# Patient Record
Sex: Female | Born: 1955 | Race: White | Hispanic: No | Smoking: Current every day smoker
Health system: Southern US, Community
[De-identification: ages and names within clinical notes are randomized; demographics above are authoritative.]

## PROBLEM LIST (undated history)

## (undated) ENCOUNTER — Ambulatory Visit (HOSPITAL_COMMUNITY): Admission: EM | Payer: Self-pay

## (undated) DIAGNOSIS — K219 Gastro-esophageal reflux disease without esophagitis: Secondary | ICD-10-CM

## (undated) DIAGNOSIS — C439 Malignant melanoma of skin, unspecified: Secondary | ICD-10-CM

## (undated) DIAGNOSIS — F329 Major depressive disorder, single episode, unspecified: Secondary | ICD-10-CM

## (undated) DIAGNOSIS — J42 Unspecified chronic bronchitis: Secondary | ICD-10-CM

## (undated) DIAGNOSIS — F32A Depression, unspecified: Secondary | ICD-10-CM

## (undated) DIAGNOSIS — G4700 Insomnia, unspecified: Secondary | ICD-10-CM

## (undated) DIAGNOSIS — F1020 Alcohol dependence, uncomplicated: Secondary | ICD-10-CM

## (undated) DIAGNOSIS — R490 Dysphonia: Secondary | ICD-10-CM

## (undated) DIAGNOSIS — I10 Essential (primary) hypertension: Secondary | ICD-10-CM

## (undated) HISTORY — DX: Malignant melanoma of skin, unspecified: C43.9

## (undated) HISTORY — DX: Dysphonia: R49.0

## (undated) HISTORY — DX: Essential (primary) hypertension: I10

## (undated) HISTORY — DX: Unspecified chronic bronchitis: J42

## (undated) HISTORY — PX: ELBOW SURGERY: SHX618

---

## 1998-05-13 ENCOUNTER — Ambulatory Visit (HOSPITAL_COMMUNITY): Admission: RE | Admit: 1998-05-13 | Discharge: 1998-05-13 | Payer: Self-pay | Admitting: Internal Medicine

## 1998-05-17 ENCOUNTER — Ambulatory Visit (HOSPITAL_COMMUNITY): Admission: RE | Admit: 1998-05-17 | Discharge: 1998-05-17 | Payer: Self-pay | Admitting: Internal Medicine

## 1999-01-14 ENCOUNTER — Encounter: Payer: Self-pay | Admitting: Internal Medicine

## 1999-01-14 ENCOUNTER — Ambulatory Visit (HOSPITAL_COMMUNITY): Admission: RE | Admit: 1999-01-14 | Discharge: 1999-01-14 | Payer: Self-pay | Admitting: Internal Medicine

## 2000-08-24 HISTORY — PX: BREAST ENHANCEMENT SURGERY: SHX7

## 2003-01-30 ENCOUNTER — Encounter: Payer: Self-pay | Admitting: Internal Medicine

## 2003-01-30 ENCOUNTER — Encounter: Admission: RE | Admit: 2003-01-30 | Discharge: 2003-01-30 | Payer: Self-pay | Admitting: Internal Medicine

## 2005-04-01 ENCOUNTER — Other Ambulatory Visit: Admission: RE | Admit: 2005-04-01 | Discharge: 2005-04-01 | Payer: Self-pay | Admitting: Internal Medicine

## 2005-12-23 ENCOUNTER — Encounter: Payer: Self-pay | Admitting: Internal Medicine

## 2007-08-25 DIAGNOSIS — C439 Malignant melanoma of skin, unspecified: Secondary | ICD-10-CM

## 2007-08-25 HISTORY — DX: Malignant melanoma of skin, unspecified: C43.9

## 2008-02-28 ENCOUNTER — Ambulatory Visit (HOSPITAL_COMMUNITY): Admission: RE | Admit: 2008-02-28 | Discharge: 2008-02-28 | Payer: Self-pay | Admitting: Internal Medicine

## 2008-06-26 ENCOUNTER — Other Ambulatory Visit: Admission: RE | Admit: 2008-06-26 | Discharge: 2008-06-26 | Payer: Self-pay | Admitting: Obstetrics and Gynecology

## 2009-12-04 ENCOUNTER — Encounter: Admission: RE | Admit: 2009-12-04 | Discharge: 2009-12-04 | Payer: Self-pay | Admitting: Internal Medicine

## 2010-06-24 DIAGNOSIS — R490 Dysphonia: Secondary | ICD-10-CM

## 2010-06-24 HISTORY — DX: Dysphonia: R49.0

## 2010-08-24 HISTORY — PX: BASAL CELL CARCINOMA EXCISION: SHX1214

## 2013-04-18 ENCOUNTER — Other Ambulatory Visit (HOSPITAL_COMMUNITY): Payer: Self-pay | Admitting: Internal Medicine

## 2013-04-18 DIAGNOSIS — Z1231 Encounter for screening mammogram for malignant neoplasm of breast: Secondary | ICD-10-CM

## 2013-04-26 ENCOUNTER — Ambulatory Visit (HOSPITAL_COMMUNITY)
Admission: RE | Admit: 2013-04-26 | Discharge: 2013-04-26 | Disposition: A | Discharge status: Home / Self Care | Payer: BC Managed Care – PPO | Source: Ambulatory Visit | Attending: Internal Medicine | Admitting: Internal Medicine

## 2013-04-26 DIAGNOSIS — Z1231 Encounter for screening mammogram for malignant neoplasm of breast: Secondary | ICD-10-CM

## 2013-05-01 ENCOUNTER — Other Ambulatory Visit: Payer: Self-pay | Admitting: Internal Medicine

## 2013-05-01 DIAGNOSIS — R928 Other abnormal and inconclusive findings on diagnostic imaging of breast: Secondary | ICD-10-CM

## 2013-05-04 ENCOUNTER — Ambulatory Visit
Admission: RE | Admit: 2013-05-04 | Discharge: 2013-05-04 | Disposition: A | Discharge status: Home / Self Care | Payer: BC Managed Care – PPO | Source: Ambulatory Visit | Attending: Internal Medicine | Admitting: Internal Medicine

## 2013-05-04 DIAGNOSIS — R928 Other abnormal and inconclusive findings on diagnostic imaging of breast: Secondary | ICD-10-CM

## 2013-12-05 ENCOUNTER — Other Ambulatory Visit: Payer: Self-pay | Admitting: Internal Medicine

## 2013-12-05 ENCOUNTER — Ambulatory Visit
Admission: RE | Admit: 2013-12-05 | Discharge: 2013-12-05 | Disposition: A | Discharge status: Home / Self Care | Payer: BC Managed Care – PPO | Source: Ambulatory Visit | Attending: Internal Medicine | Admitting: Internal Medicine

## 2013-12-05 DIAGNOSIS — R109 Unspecified abdominal pain: Secondary | ICD-10-CM

## 2013-12-12 ENCOUNTER — Other Ambulatory Visit: Payer: BC Managed Care – PPO

## 2014-02-12 ENCOUNTER — Other Ambulatory Visit (HOSPITAL_COMMUNITY)
Admission: RE | Admit: 2014-02-12 | Discharge: 2014-02-12 | Disposition: A | Discharge status: Home / Self Care | Payer: BC Managed Care – PPO | Source: Ambulatory Visit | Attending: Internal Medicine | Admitting: Internal Medicine

## 2014-02-12 ENCOUNTER — Other Ambulatory Visit: Payer: Self-pay | Admitting: Internal Medicine

## 2014-02-12 DIAGNOSIS — Z1151 Encounter for screening for human papillomavirus (HPV): Secondary | ICD-10-CM | POA: Insufficient documentation

## 2014-02-12 DIAGNOSIS — Z01419 Encounter for gynecological examination (general) (routine) without abnormal findings: Secondary | ICD-10-CM | POA: Insufficient documentation

## 2014-02-15 ENCOUNTER — Other Ambulatory Visit: Payer: BC Managed Care – PPO

## 2014-02-16 LAB — CYTOLOGY - PAP

## 2014-02-20 ENCOUNTER — Encounter (INDEPENDENT_AMBULATORY_CARE_PROVIDER_SITE_OTHER): Payer: Self-pay

## 2014-02-20 ENCOUNTER — Ambulatory Visit
Admission: RE | Admit: 2014-02-20 | Discharge: 2014-02-20 | Disposition: A | Discharge status: Home / Self Care | Payer: BC Managed Care – PPO | Source: Ambulatory Visit | Attending: Internal Medicine | Admitting: Internal Medicine

## 2014-02-20 DIAGNOSIS — R109 Unspecified abdominal pain: Secondary | ICD-10-CM

## 2014-02-20 MED ORDER — IOHEXOL 300 MG/ML  SOLN
100.0000 mL | Freq: Once | INTRAMUSCULAR | Status: AC | PRN
  Administered 2014-02-20: 100 mL via INTRAVENOUS

## 2015-01-30 ENCOUNTER — Other Ambulatory Visit: Payer: Self-pay | Admitting: Internal Medicine

## 2015-01-30 DIAGNOSIS — H532 Diplopia: Secondary | ICD-10-CM

## 2015-02-05 ENCOUNTER — Ambulatory Visit
Admission: RE | Admit: 2015-02-05 | Discharge: 2015-02-05 | Disposition: A | Discharge status: Home / Self Care | Payer: BLUE CROSS/BLUE SHIELD | Source: Ambulatory Visit | Attending: Internal Medicine | Admitting: Internal Medicine

## 2015-02-05 ENCOUNTER — Other Ambulatory Visit: Payer: Self-pay

## 2015-02-05 DIAGNOSIS — H532 Diplopia: Secondary | ICD-10-CM

## 2015-04-21 IMAGING — CT CT ABD-PELV W/ CM
3 of 5 series · 13 of 36 positions shown, 19 images · IV contrast (READICAT/WATER & [ID] OMNI 300)
Comparison: 12/04/2009 ultrasound

CLINICAL DATA: Generalized abdominal pain for 4 months with
microscopic hematuria, history of melanoma

EXAM:
CT ABDOMEN AND PELVIS WITH CONTRAST
TECHNIQUE: Multidetector CT imaging of the abdomen and pelvis was performed
using the standard protocol following bolus administration of
intravenous contrast.
CONTRAST:  100mL OMNIPAQUE IOHEXOL 300 MG/ML  SOLN

[Series 3: abd/pelvis with · axial · 0.78mm/px · z∈[-352,-37]mm · 7 of 85 slices shown, 12 images]
[im 11/85  soft-tissue]
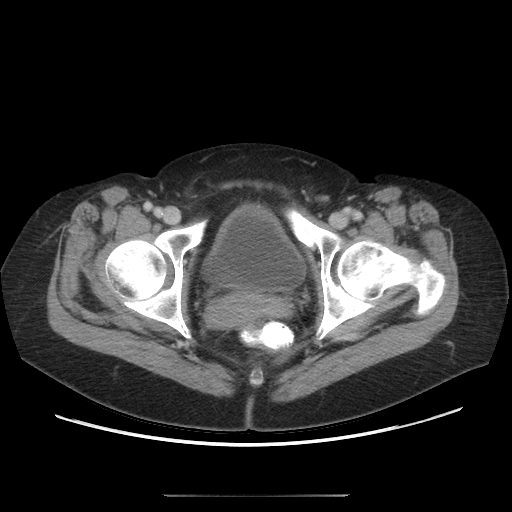
[im 11/85  bone]
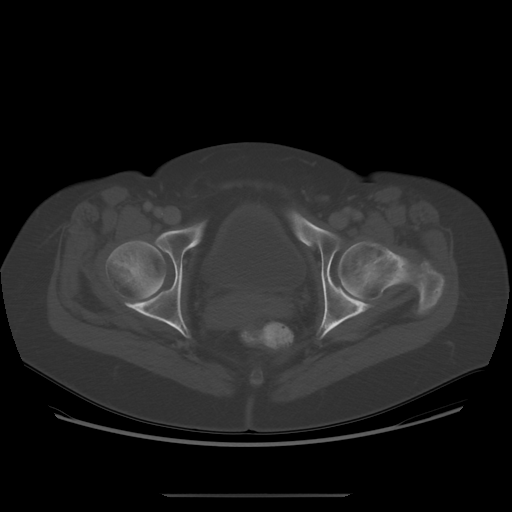
[im 22/85  soft-tissue]
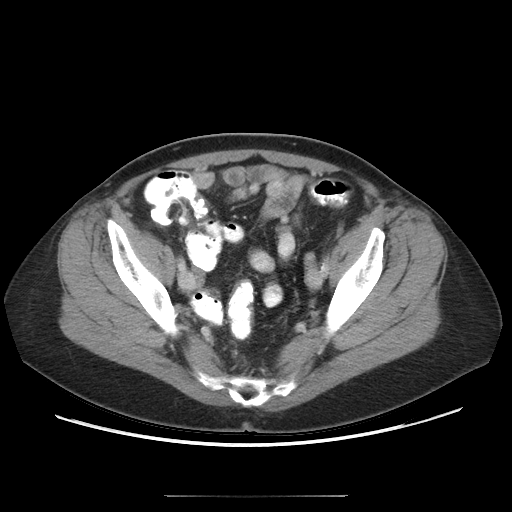
[im 32/85  soft-tissue]
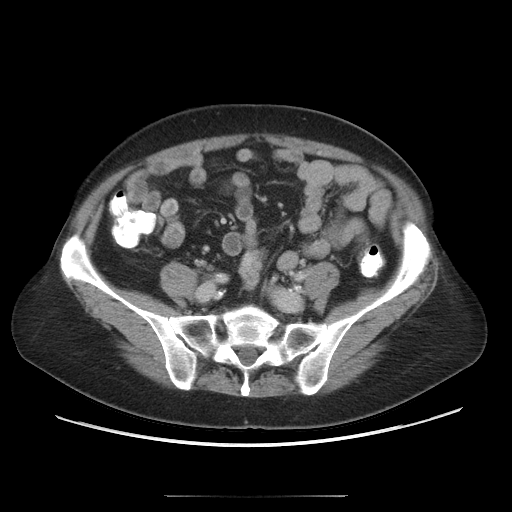
[im 43/85  soft-tissue]
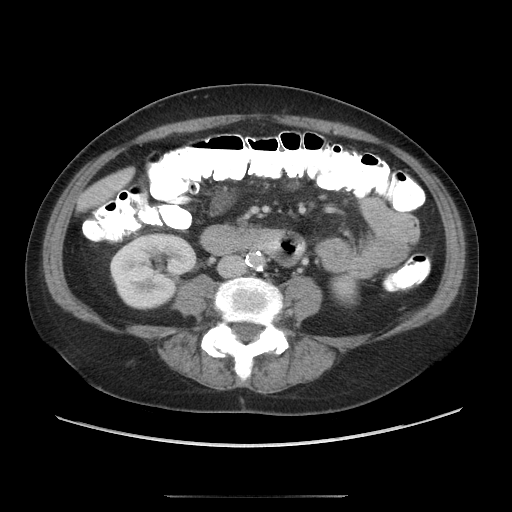
[im 43/85  lung]
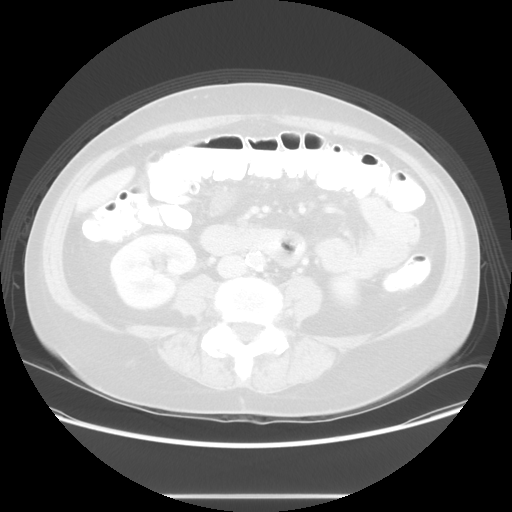
[im 53/85  soft-tissue]
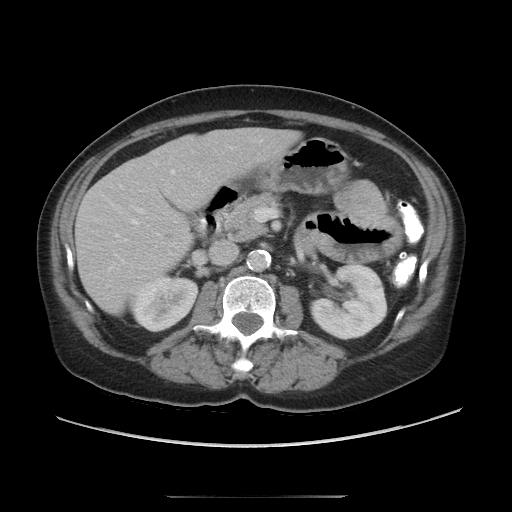
[im 53/85  lung]
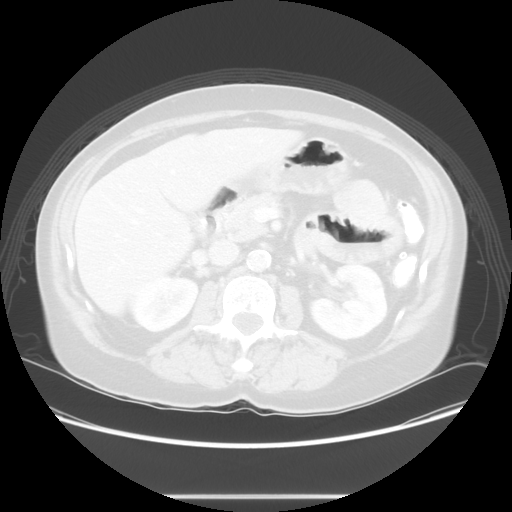
[im 64/85  soft-tissue]
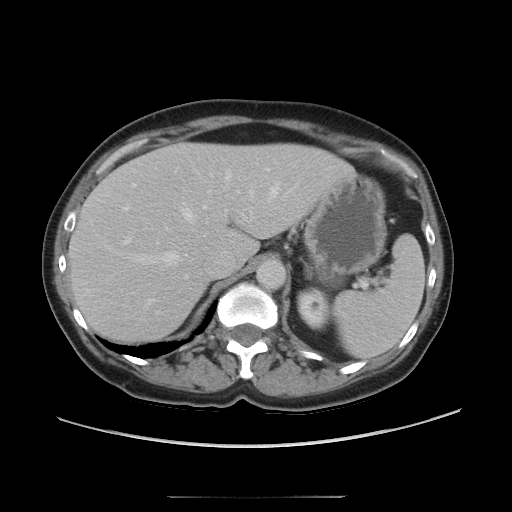
[im 64/85  lung]
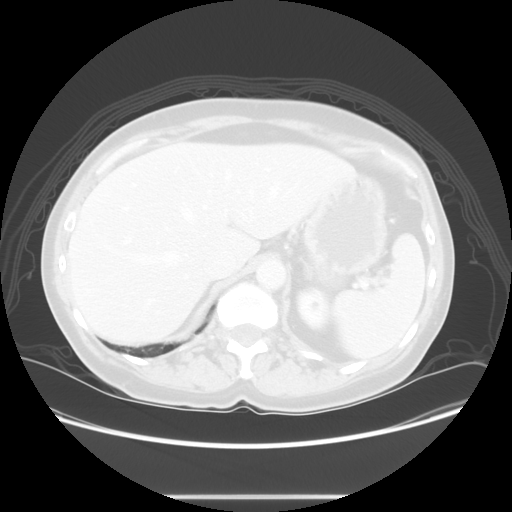
[im 74/85  soft-tissue]
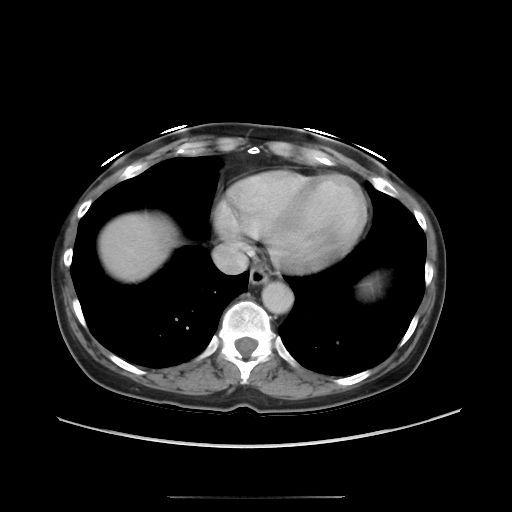
[im 74/85  lung]
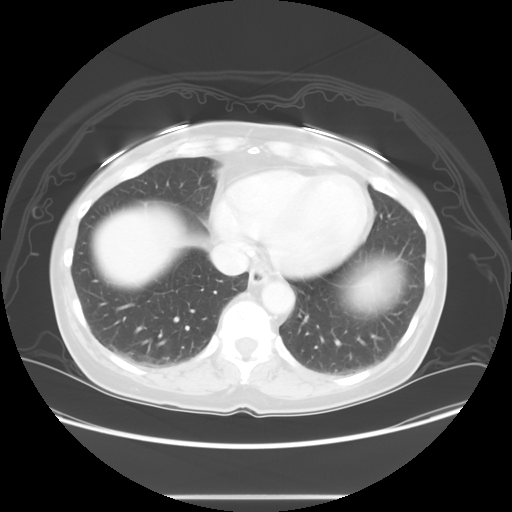

[Series 601: coronal body · coronal · 0.87mm/px · 1 of 112 slices shown, 2 images]
[im 38/112  soft-tissue]
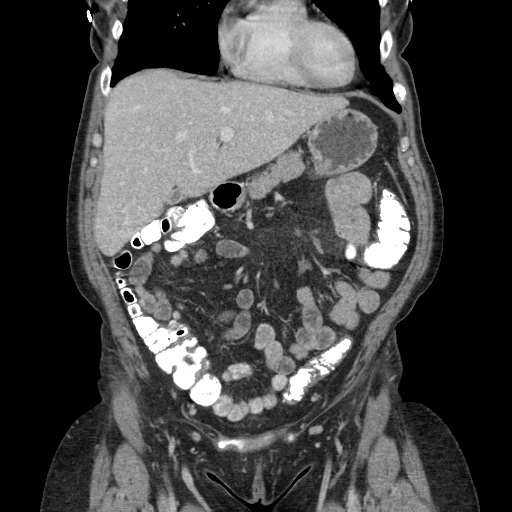
[im 38/112  bone]
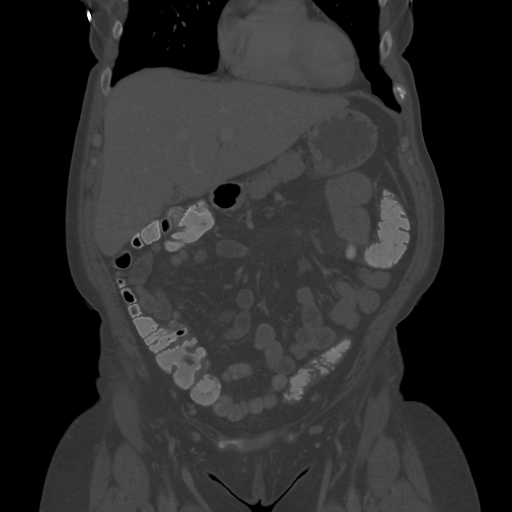

[Series 602: sagittal body · sagittal · 0.87mm/px · 5 of 161 slices shown]
[im 11/161  soft-tissue]
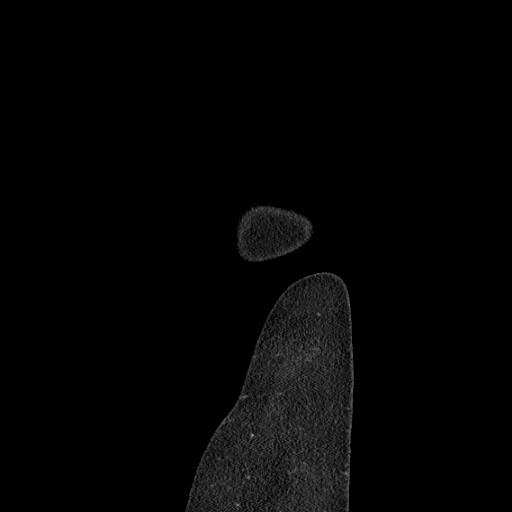
[im 31/161  soft-tissue]
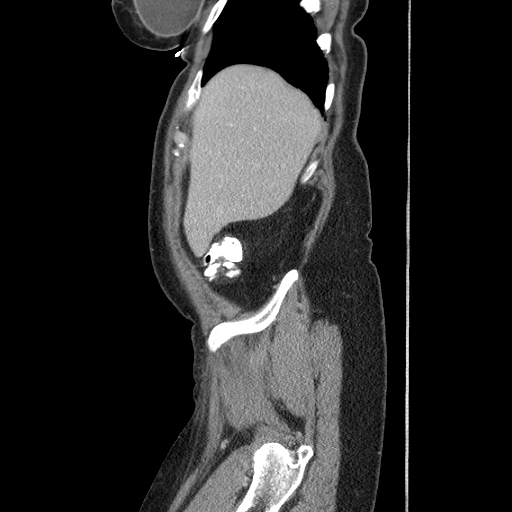
[im 51/161  soft-tissue]
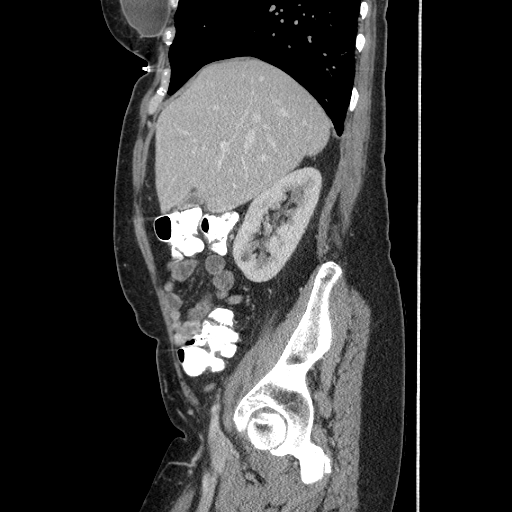
[im 71/161  soft-tissue]
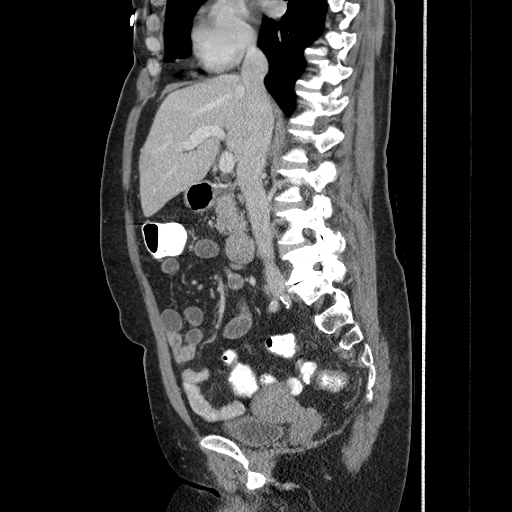
[im 91/161  soft-tissue]
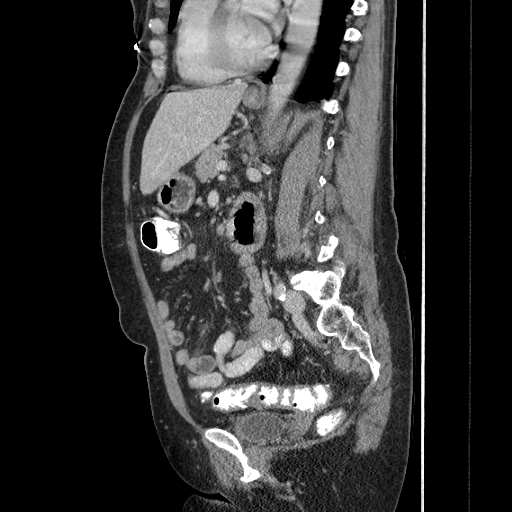

[13 of 36 positions shown; findings below may reference images not displayed]

FINDINGS: Visualized portions of the lung bases are clear.

Gallbladder is contracted.  Liver is normal.  Spleen is normal.

Pancreas is normal. Adrenal glands are normal. Small round
low-attenuation lesions in the left kidney all less than a cm, too
small to characterize. Similar low-attenuation lesion lower pole
right kidney.

Moderate calcification of the abdominal aorta. Nonobstructive bowel
gas pattern. Mild diverticulosis of the distal descending colon with
moderate diverticulosis of the sigmoid colon. No evidence of
diverticulitis.

Bladder is normal. Reproductive organs are normal. No significant
retroperitoneal or mesenteric adenopathy. No free fluid.

No acute musculoskeletal findings.
IMPRESSION: No acute findings. Incidentally detected subcentimeter
low-attenuation renal lesions likely cysts but not well
characterized by CT given their small size. One small cyst was seen
on renal ultrasound on the right performed 12/04/2009.

Diverticulosis.

## 2015-12-10 ENCOUNTER — Ambulatory Visit
Admission: RE | Admit: 2015-12-10 | Discharge: 2015-12-10 | Disposition: A | Discharge status: Home / Self Care | Payer: BLUE CROSS/BLUE SHIELD | Source: Ambulatory Visit | Attending: Internal Medicine | Admitting: Internal Medicine

## 2015-12-10 ENCOUNTER — Other Ambulatory Visit: Payer: Self-pay | Admitting: Internal Medicine

## 2015-12-10 DIAGNOSIS — F17209 Nicotine dependence, unspecified, with unspecified nicotine-induced disorders: Secondary | ICD-10-CM

## 2017-01-23 ENCOUNTER — Emergency Department (HOSPITAL_COMMUNITY)
Admission: EM | Admit: 2017-01-23 | Discharge: 2017-01-23 | Disposition: A | Discharge status: Home / Self Care | Payer: BLUE CROSS/BLUE SHIELD | Attending: Emergency Medicine | Admitting: Emergency Medicine

## 2017-01-23 ENCOUNTER — Encounter (HOSPITAL_COMMUNITY): Payer: Self-pay | Admitting: Emergency Medicine

## 2017-01-23 DIAGNOSIS — Z85828 Personal history of other malignant neoplasm of skin: Secondary | ICD-10-CM | POA: Insufficient documentation

## 2017-01-23 DIAGNOSIS — M79644 Pain in right finger(s): Secondary | ICD-10-CM

## 2017-01-23 DIAGNOSIS — X58XXXA Exposure to other specified factors, initial encounter: Secondary | ICD-10-CM | POA: Diagnosis not present

## 2017-01-23 DIAGNOSIS — S60456A Superficial foreign body of right little finger, initial encounter: Secondary | ICD-10-CM | POA: Diagnosis present

## 2017-01-23 DIAGNOSIS — Y929 Unspecified place or not applicable: Secondary | ICD-10-CM | POA: Diagnosis not present

## 2017-01-23 DIAGNOSIS — Z79899 Other long term (current) drug therapy: Secondary | ICD-10-CM | POA: Diagnosis not present

## 2017-01-23 DIAGNOSIS — Y999 Unspecified external cause status: Secondary | ICD-10-CM | POA: Diagnosis not present

## 2017-01-23 DIAGNOSIS — Y939 Activity, unspecified: Secondary | ICD-10-CM | POA: Insufficient documentation

## 2017-01-23 DIAGNOSIS — F17210 Nicotine dependence, cigarettes, uncomplicated: Secondary | ICD-10-CM | POA: Insufficient documentation

## 2017-01-23 DIAGNOSIS — I10 Essential (primary) hypertension: Secondary | ICD-10-CM | POA: Diagnosis not present

## 2017-01-23 NOTE — ED Notes (Signed)
MD at bedside. Cut ring off. Pt had immediate relief. No further questions.

## 2017-01-23 NOTE — ED Provider Notes (Signed)
[place] DEPT Provider Note   CSN: [id number] Arrival date & time: 01/23/17  0604     History   Chief Complaint Chief Complaint  Patient presents with  . ring stuck on finger    HPI Dorothy [name] is a 61 y.o. female.  HPI Patient reports the ring on her right little finger is stuck and she is unable to get this off.  When she went to bed and was normal.  She does not remember what she did in the middle night.  She feels like her fingers swelling.  Her pain is mild to moderate.  She's tried ice and lubrication without improvement.   Past Medical History:  Diagnosis Date  . Chronic bronchitis (HCC)    Still smoking  . Hoarseness 06/2010   Oral thrush---- Dr. Melida Quitter  . Hypertension   . Malignant melanoma ([place]) 2009   Removed from left shoulder    There are no active problems to display for this patient.   Past Surgical History:  Procedure Laterality Date  . BASAL CELL CARCINOMA EXCISION  2012   Right side nose   . BREAST ENHANCEMENT SURGERY  2002  . ELBOW SURGERY     Right elbow surgery---Ruptured tendon    OB History    No data available       Home Medications    Prior to Admission medications   Medication Sig Start Date End Date Taking? Authorizing Provider  amLODipine (NORVASC) 5 MG tablet Take 5 mg by mouth daily.    [provider]  fluticasone (FLONASE) 50 MCG/ACT nasal spray Place into both nostrils daily.    [provider]  losartan-hydrochlorothiazide (HYZAAR) 50-12.5 MG per tablet Take 1 tablet by mouth daily.    [provider]  triamcinolone cream (KENALOG) 0.5 % Apply 1 application topically 2 (two) times daily as needed.    [provider]    Family History Family History  Problem Relation Age of Onset  . Cirrhosis Father     Social History Social History  Substance Use Topics  . Smoking status: Current Every Day Smoker    Packs/day: 1.00    Types: Cigarettes  . Smokeless tobacco:  Not on file  . Alcohol use Yes     Comment: everyday     Allergies   Patient has no known allergies.   Review of Systems Review of Systems  All other systems reviewed and are negative.    Physical Exam Updated Vital Signs BP (!) 176/102 (BP Location: Right Arm)   Pulse 83   Temp 98.4 F (36.9 C) (Oral)   Resp 18   Ht 5\' 6"  (1.676 m)   Wt 65.8 kg (145 lb)   SpO2 100%   BMI 23.40 kg/m   Physical Exam  Constitutional: She is oriented to person, place, and time. She appears well-developed and well-nourished.  HENT:  Head: Normocephalic.  Eyes: EOM are normal.  Neck: Normal range of motion.  Pulmonary/Chest: Effort normal.  Abdominal: She exhibits no distension.  Musculoskeletal:  Ring on her right little finger is wedged with some associated swelling.  No erythema.  No breakdown of skin  Neurological: She is alert and oriented to person, place, and time.  Psychiatric: She has a normal mood and affect.  Nursing note and vitals reviewed.    ED Treatments / Results  Labs (all labs ordered are listed, but only abnormal results are displayed) Labs Reviewed - No data to display  EKG  EKG  Interpretation None       Radiology No results found.  Procedures .Foreign Body Removal Performed by: Jola Schmidt Authorized by: Jola Schmidt  Consent: Verbal consent obtained. Consent given by: patient Intake: right little finger. Complexity: simple 1 objects recovered. Objects recovered: ring Post-procedure assessment: foreign body removed Patient tolerance: Patient tolerated the procedure well with no immediate complications   (including critical care time)  Medications Ordered in ED Medications - No data to display   Initial Impression / Assessment and Plan / ED Course  I have reviewed the triage vital signs and the nursing notes.  Pertinent labs & imaging results that were available during my care of the patient were reviewed by me and considered in my  medical decision making (see chart for details).     Ring removed without difficulty.  Discharge over good condition  Final Clinical Impressions(s) / ED Diagnoses   Final diagnoses:  None    New Prescriptions New Prescriptions   No medications on file     Jola Schmidt, MD 01/23/17 [id number]

## 2017-01-23 NOTE — ED Triage Notes (Addendum)
Pt to ED after being woken up out of her sleep with her ring stuck on her finger.  One ring on her pinky finger. Pt states she has tried ice and lubricant and was not able to get them off. CMS intact. Pt has swelling to finger

## 2017-01-23 NOTE — ED Notes (Signed)
Pt verbalized understanding of d/c instructions and has no further questions. Pt is stable, A&Ox4, VSS.  

## 2017-06-17 ENCOUNTER — Ambulatory Visit (HOSPITAL_COMMUNITY)
Admission: EM | Admit: 2017-06-17 | Discharge: 2017-06-17 | Disposition: A | Discharge status: Home / Self Care | Payer: BLUE CROSS/BLUE SHIELD

## 2017-06-17 ENCOUNTER — Encounter (HOSPITAL_COMMUNITY): Payer: Self-pay | Admitting: Emergency Medicine

## 2017-06-17 DIAGNOSIS — R1084 Generalized abdominal pain: Secondary | ICD-10-CM | POA: Diagnosis not present

## 2017-06-17 NOTE — Discharge Instructions (Signed)
Report to the emergency department for further evaluation of your abdominal pain

## 2017-06-17 NOTE — ED Provider Notes (Signed)
Dorothy [name]    CSN: [id number] Arrival date & time: 06/17/17  1340     History   Chief Complaint No chief complaint on file.   HPI Dorothy [name] is a 61 y.o. female.   Subjective:   Dorothy [name] is a 61 y.o. female who presents for evaluation of abdominal pain. The pain is intermittent/episodic, described as sharp and is 10/10 in intensity during these episodes. Pain is located in the RUQ as well as the periumbilical region without any radiation. Acute in onset this morning. Patient was in her normal state of health upon going to bed last night. Symptoms have been unchanged since onset. Aggravating factors: none.  Alleviating factors: none. Associated symptoms: none. The patient denies anorexia, constipation, diarrhea, fever, flatus, hematochezia, melena, nausea, vomiting, dysuria, back pain or flank pain.   The following portions of the patient's history were reviewed and updated as appropriate: allergies, current medications, past family history, past medical history, past social history, past surgical history and problem list.         Past Medical History:  Diagnosis Date  . Chronic bronchitis (HCC)    Still smoking  . Hoarseness 06/2010   Oral thrush---- Dr. Melida Quitter  . Hypertension   . Malignant melanoma ([place]) 2009   Removed from left shoulder    There are no active problems to display for this patient.   Past Surgical History:  Procedure Laterality Date  . BASAL CELL CARCINOMA EXCISION  2012   Right side nose   . BREAST ENHANCEMENT SURGERY  2002  . ELBOW SURGERY     Right elbow surgery---Ruptured tendon    OB History    No data available       Home Medications    Prior to Admission medications   Medication Sig Start Date End Date Taking? Authorizing Provider  amLODipine (NORVASC) 5 MG tablet Take 5 mg by mouth daily.    [provider]  fluticasone (FLONASE) 50 MCG/ACT nasal spray Place into both nostrils daily.     [provider]  losartan-hydrochlorothiazide (HYZAAR) 50-12.5 MG per tablet Take 1 tablet by mouth daily.    [provider]  triamcinolone cream (KENALOG) 0.5 % Apply 1 application topically 2 (two) times daily as needed.    [provider]    Family History Family History  Problem Relation Age of Onset  . Cirrhosis Father     Social History Social History  Substance Use Topics  . Smoking status: Current Every Day Smoker    Packs/day: 1.00    Types: Cigarettes  . Smokeless tobacco: Not on file  . Alcohol use Yes     Comment: everyday     Allergies   Patient has no known allergies.   Review of Systems Review of Systems  Constitutional: Negative for fever.  Gastrointestinal: Positive for abdominal pain. Negative for abdominal distention, blood in stool, constipation, diarrhea, nausea and vomiting.  Genitourinary: Negative for dysuria and flank pain.  Musculoskeletal: Negative for back pain.  All other systems reviewed and are negative.    Physical Exam Triage Vital Signs ED Triage Vitals [06/17/17 1404]  Enc Vitals Group     BP (!) 142/94     Pulse Rate 92     Resp 18     Temp 98.5 F (36.9 C)     Temp Source Oral     SpO2 99 %     Weight      Height  Head Circumference      Peak Flow      Pain Score      Pain Loc      Pain Edu?      Excl. in [place]?    No data found.   Updated Vital Signs BP (!) 142/94 (BP Location: Right Arm) Comment: Provider Enrique Sack advised of BP  Pulse 92   Temp 98.5 F (36.9 C) (Oral)   Resp 18   SpO2 99%   Visual Acuity Right Eye Distance:   Left Eye Distance:   Bilateral Distance:    Right Eye Near:   Left Eye Near:    Bilateral Near:     Physical Exam  Constitutional: She is oriented to person, place, and time. She appears well-developed and well-nourished.  Cardiovascular: Normal rate and regular rhythm.   Pulmonary/Chest: Effort normal and breath sounds normal.    Abdominal: Soft. Bowel sounds are normal. She exhibits no distension. There is tenderness. There is no rebound and no guarding.  Musculoskeletal: Normal range of motion.  Neurological: She is alert and oriented to person, place, and time.  Skin: Skin is warm and dry.  Psychiatric: She has a normal mood and affect.     UC Treatments / Results  Labs (all labs ordered are listed, but only abnormal results are displayed) Labs Reviewed - No data to display  EKG  EKG Interpretation None       Radiology No results found.  Procedures Procedures (including critical care time)  Medications Ordered in UC Medications - No data to display   Initial Impression / Assessment and Plan / UC Course  I have reviewed the triage vital signs and the nursing notes.  Pertinent labs & imaging results that were available during my care of the patient were reviewed by me and considered in my medical decision making (see chart for details).     61 year old female with history of diverticulitis that presents with acute abdominal pain. No anorexia, constipation, diarrhea, fever, flatus, melena, bloody stools, nausea or vomiting. Patient is nontoxic appearing. Has generalized tenderness throughout the abdomen. Differentials are vast and include acute cholecystitis, biliary colic or diverticulitis. Patient is afebrile and nontoxic appearance. Patient is advised to report to the emergency department for ongoing evaluation with CT scan and/or ultrasound. She was initially reluctant to going but has now agreed to such.  Final Clinical Impressions(s) / UC Diagnoses   Final diagnoses:  Generalized abdominal pain    New Prescriptions New Prescriptions   No medications on file     Controlled Substance Prescriptions [place] Controlled Substance Registry consulted? Not Applicable   Enrique Sack, [place] 06/17/17 1419

## 2017-06-17 NOTE — ED Triage Notes (Signed)
Patient has already seen samantha, np

## 2018-06-06 ENCOUNTER — Other Ambulatory Visit: Payer: Self-pay

## 2018-06-06 ENCOUNTER — Encounter (HOSPITAL_COMMUNITY): Payer: Self-pay

## 2018-06-06 ENCOUNTER — Emergency Department (HOSPITAL_COMMUNITY)
Admission: EM | Admit: 2018-06-06 | Discharge: 2018-06-06 | Disposition: A | Discharge status: Home / Self Care | Payer: BLUE CROSS/BLUE SHIELD | Attending: Emergency Medicine | Admitting: Emergency Medicine

## 2018-06-06 DIAGNOSIS — F1010 Alcohol abuse, uncomplicated: Secondary | ICD-10-CM | POA: Insufficient documentation

## 2018-06-06 DIAGNOSIS — Z5321 Procedure and treatment not carried out due to patient leaving prior to being seen by health care provider: Secondary | ICD-10-CM | POA: Insufficient documentation

## 2018-06-06 HISTORY — DX: Gastro-esophageal reflux disease without esophagitis: K21.9

## 2018-06-06 HISTORY — DX: Major depressive disorder, single episode, unspecified: F32.9

## 2018-06-06 HISTORY — DX: Insomnia, unspecified: G47.00

## 2018-06-06 HISTORY — DX: Depression, unspecified: F32.A

## 2018-06-06 NOTE — ED Notes (Signed)
Pt walked out of room and towards double doors for exit and this RN saw her and asked what she needed and the pt stated "I am leaving." She then walked out of the ER. Pt had a steady gate upon leaving.

## 2018-06-06 NOTE — ED Triage Notes (Signed)
Per EMS- Patient was at [facility] and was intoxicated at the [facility]. Patient requested detox from alcohol. History from physician states the patient drink >1 pint of Vodka a day and wine.

## 2018-06-06 NOTE — ED Notes (Signed)
Bed: WLPT3 Expected date:  Expected time:  Means of arrival:  Comments: 

## 2018-06-07 ENCOUNTER — Encounter (HOSPITAL_COMMUNITY): Payer: Self-pay | Admitting: Emergency Medicine

## 2018-06-07 ENCOUNTER — Emergency Department (HOSPITAL_COMMUNITY)
Admission: EM | Admit: 2018-06-07 | Discharge: 2018-06-08 | Disposition: A | Discharge status: Other Acute Inpatient Hospital | Payer: Self-pay | Attending: Emergency Medicine | Admitting: Emergency Medicine

## 2018-06-07 DIAGNOSIS — I10 Essential (primary) hypertension: Secondary | ICD-10-CM | POA: Insufficient documentation

## 2018-06-07 DIAGNOSIS — F1910 Other psychoactive substance abuse, uncomplicated: Secondary | ICD-10-CM | POA: Insufficient documentation

## 2018-06-07 DIAGNOSIS — F1010 Alcohol abuse, uncomplicated: Secondary | ICD-10-CM

## 2018-06-07 DIAGNOSIS — F322 Major depressive disorder, single episode, severe without psychotic features: Secondary | ICD-10-CM | POA: Insufficient documentation

## 2018-06-07 DIAGNOSIS — F1020 Alcohol dependence, uncomplicated: Secondary | ICD-10-CM | POA: Insufficient documentation

## 2018-06-07 DIAGNOSIS — F17210 Nicotine dependence, cigarettes, uncomplicated: Secondary | ICD-10-CM | POA: Insufficient documentation

## 2018-06-07 HISTORY — DX: Alcohol dependence, uncomplicated: F10.20

## 2018-06-07 LAB — COMPREHENSIVE METABOLIC PANEL
ALK PHOS: 71 U/L (ref 38–126)
ALT: 63 U/L — AB (ref 0–44)
AST: 90 U/L — AB (ref 15–41)
Albumin: 4.2 g/dL (ref 3.5–5.0)
Anion gap: 15 (ref 5–15)
BUN: 18 mg/dL (ref 6–20)
CALCIUM: 9.4 mg/dL (ref 8.9–10.3)
CO2: 24 mmol/L (ref 22–32)
CREATININE: 0.68 mg/dL (ref 0.44–1.00)
Chloride: 94 mmol/L — ABNORMAL LOW (ref 98–111)
GFR calc Af Amer: >60 mL/min (ref >60)
Glucose, Bld: 84 mg/dL (ref 70–99)
Potassium: 4.2 mmol/L (ref 3.5–5.1)
Sodium: 133 mmol/L — ABNORMAL LOW (ref 135–145)
Total Bilirubin: 1.6 mg/dL — ABNORMAL HIGH (ref 0.3–1.2)
Total Protein: 6.9 g/dL (ref 6.5–8.1)

## 2018-06-07 LAB — CBC
HEMATOCRIT: 43.9 % (ref 36.0–46.0)
Hemoglobin: 14.9 g/dL (ref 12.0–15.0)
MCH: 34.1 pg — AB (ref 26.0–34.0)
MCHC: 33.9 g/dL (ref 30.0–36.0)
MCV: 100.5 fL — ABNORMAL HIGH (ref 80.0–100.0)
Platelets: 229 10*3/uL (ref 150–400)
RBC: 4.37 MIL/uL (ref 3.87–5.11)
RDW: 14.1 % (ref 11.5–15.5)
WBC: 12.4 10*3/uL — AB (ref 4.0–10.5)
nRBC: 0 % (ref 0.0–0.2)

## 2018-06-07 LAB — RAPID URINE DRUG SCREEN, HOSP PERFORMED
Amphetamines: NOT DETECTED
BARBITURATES: NOT DETECTED
Benzodiazepines: POSITIVE — AB
Cocaine: NOT DETECTED
Opiates: NOT DETECTED
Tetrahydrocannabinol: POSITIVE — AB

## 2018-06-07 LAB — ETHANOL: Alcohol, Ethyl (B): <10 mg/dL (ref <10)

## 2018-06-07 MED ORDER — FLUOXETINE HCL 20 MG PO CAPS: 40.0000 mg | ORAL_CAPSULE | ORAL | Status: DC

## 2018-06-07 MED ORDER — LORAZEPAM 2 MG/ML IJ SOLN: 0.0000 mg | Freq: Four times a day (QID) | INTRAMUSCULAR | Status: DC

## 2018-06-07 MED ORDER — ALUM & MAG HYDROXIDE-SIMETH 200-200-20 MG/5ML PO SUSP: 30.0000 mL | Freq: Four times a day (QID) | ORAL | Status: DC | PRN

## 2018-06-07 MED ORDER — LORAZEPAM 1 MG PO TABS: 0.0000 mg | ORAL_TABLET | Freq: Two times a day (BID) | ORAL | Status: DC

## 2018-06-07 MED ORDER — LORAZEPAM 1 MG PO TABS
0.0000 mg | ORAL_TABLET | Freq: Four times a day (QID) | ORAL | Status: DC
  Administered 2018-06-07: 1 mg via ORAL
  Administered 2018-06-07: 3 mg via ORAL
  Filled 2018-06-07: qty 2
  Filled 2018-06-07: qty 1

## 2018-06-07 MED ORDER — LORAZEPAM 2 MG/ML IJ SOLN: 0.0000 mg | Freq: Two times a day (BID) | INTRAMUSCULAR | Status: DC

## 2018-06-07 MED ORDER — NICOTINE 21 MG/24HR TD PT24
21.0000 mg | MEDICATED_PATCH | Freq: Every day | TRANSDERMAL | Status: DC
  Administered 2018-06-07: 21 mg via TRANSDERMAL
  Filled 2018-06-07: qty 1

## 2018-06-07 MED ORDER — VITAMIN B-1 100 MG PO TABS
100.0000 mg | ORAL_TABLET | Freq: Every day | ORAL | Status: DC
  Administered 2018-06-07: 100 mg via ORAL
  Filled 2018-06-07: qty 1

## 2018-06-07 MED ORDER — THIAMINE HCL 100 MG/ML IJ SOLN: 100.0000 mg | Freq: Every day | INTRAMUSCULAR | Status: DC

## 2018-06-07 MED ORDER — CLONAZEPAM 1 MG PO TABS
1.0000 mg | ORAL_TABLET | Freq: Every day | ORAL | Status: DC
  Administered 2018-06-07: 1 mg via ORAL
  Filled 2018-06-07: qty 1

## 2018-06-07 NOTE — ED Notes (Signed)
Bed: WA27 Expected date:  Expected time:  Means of arrival:  Comments: 

## 2018-06-07 NOTE — ED Notes (Signed)
Per pt, states her last drink was at 0400

## 2018-06-07 NOTE — BH Assessment (Signed)
[place] Assessment Progress Note     Per Priscille Loveless, NP, Inpatient Treatment is recommended. Patient is under review for a possible [facility] admission.

## 2018-06-07 NOTE — ED Provider Notes (Signed)
[place] DEPT Provider Note   CSN: [id number] Arrival date & time: 06/07/18  1308     History   Chief Complaint Chief Complaint  Patient presents with  . IVC  . Addiction Problem  . Depression    HPI Dorothy [name] is a 62 y.o. female who presents the emergency department under involuntary commitment for alcohol abuse.  The patient was here yesterday evening after presenting to her primary care physician's office severe alcohol intoxication.  Her PCP sent her to the emergency department by ambulance.  Patient had a long wait here in the emergency department, was not under involuntary commitment and eventually left.  This morning her husband took out involuntary commitment paperwork and she is now here in the emergency department.  Patient states that she drinks "heavily all day long."  She drinks wine and vodka.  She takes clonazepam 1 daily at night for sleep.  She states that she gets the shakes when she does not drink.  She denies a history of DTs, seizures with withdrawal.  She is only ever tried to stop drinking one time and it only lasted about 1 week.  She is a daily cigarette smoker denies other drug use, she denies SI/HI/AVH. HPI  Past Medical History:  Diagnosis Date  . Alcoholic ([place])   . Chronic bronchitis (HCC)    Still smoking  . Depression   . GERD (gastroesophageal reflux disease)   . Hoarseness 06/2010   Oral thrush---- Dr. Melida Quitter  . Hypertension   . Insomnia   . Malignant melanoma ([place]) 2009   Removed from left shoulder    There are no active problems to display for this patient.   Past Surgical History:  Procedure Laterality Date  . BASAL CELL CARCINOMA EXCISION  2012   Right side nose   . BREAST ENHANCEMENT SURGERY  2002  . ELBOW SURGERY     Right elbow surgery---Ruptured tendon     OB History   None      Home Medications    Prior to Admission medications   Medication Sig Start Date End Date  Taking? Authorizing Provider  clonazePAM (KLONOPIN) 1 MG tablet Take 1 mg by mouth daily. 05/31/18  Yes [provider]  FLUoxetine (PROZAC) 20 MG capsule Take 40 mg by mouth every morning. 05/18/18  Yes [provider]    Family History Family History  Problem Relation Age of Onset  . Cirrhosis Father     Social History Social History   Tobacco Use  . Smoking status: Current Every Day Smoker    Packs/day: 1.00    Types: Cigarettes  . Smokeless tobacco: Never Used  Substance Use Topics  . Alcohol use: Yes    Comment: >1 pint vodka daily and wine  . Drug use: Not Currently     Allergies   Patient has no known allergies.   Review of Systems Review of Systems  Ten systems reviewed and are negative for acute change, except as noted in the HPI.   Physical Exam Updated Vital Signs BP 137/76 (BP Location: Left Arm)   Pulse 65   Temp 98.2 F (36.8 C) (Oral)   Resp (!) 26   SpO2 96%   Physical Exam  Constitutional: She is oriented to person, place, and time. She appears well-developed and well-nourished. No distress.  Patient has a flushed complexion.  Tearful, visibly tremulous.  She appears anxious. Vital signs noted with tachycardia and hypertension.  HENT:  Head:  Normocephalic and atraumatic.  Eyes: Conjunctivae are normal. No scleral icterus.  Neck: Normal range of motion.  Cardiovascular: Normal rate, regular rhythm and normal heart sounds. Exam reveals no gallop and no friction rub.  No murmur heard. Pulmonary/Chest: Effort normal and breath sounds normal. No respiratory distress.  Abdominal: Soft. Bowel sounds are normal. She exhibits no distension and no mass. There is no tenderness. There is no guarding.  Neurological: She is alert and oriented to person, place, and time.  Skin: Skin is warm and dry. She is not diaphoretic.  Psychiatric: Her behavior is normal. Her mood appears anxious. She is not agitated and not actively hallucinating.    Nursing note and vitals reviewed.    ED Treatments / Results  Labs (all labs ordered are listed, but only abnormal results are displayed) Labs Reviewed  COMPREHENSIVE METABOLIC PANEL - Abnormal; Notable for the following components:      Result Value   Sodium 133 (*)    Chloride 94 (*)    AST 90 (*)    ALT 63 (*)    Total Bilirubin 1.6 (*)    All other components within normal limits  CBC - Abnormal; Notable for the following components:   WBC 12.4 (*)    MCV 100.5 (*)    MCH 34.1 (*)    All other components within normal limits  RAPID URINE DRUG SCREEN, HOSP PERFORMED - Abnormal; Notable for the following components:   Benzodiazepines POSITIVE (*)    Tetrahydrocannabinol POSITIVE (*)    All other components within normal limits  ETHANOL    EKG None  Radiology No results found.  Procedures Procedures (including critical care time)  Medications Ordered in ED Medications  LORazepam (ATIVAN) injection 0-4 mg ( Intravenous See Alternative 06/07/18 2232)    Or  LORazepam (ATIVAN) tablet 0-4 mg (1 mg Oral Given 06/07/18 2232)  LORazepam (ATIVAN) injection 0-4 mg (has no administration in time range)    Or  LORazepam (ATIVAN) tablet 0-4 mg (has no administration in time range)  thiamine (VITAMIN B-1) tablet 100 mg (100 mg Oral Given 06/07/18 1431)    Or  thiamine (B-1) injection 100 mg ( Intravenous See Alternative 06/07/18 1431)  nicotine (NICODERM CQ - dosed in mg/24 hours) patch 21 mg (21 mg Transdermal Patch Applied 06/07/18 1431)  alum & mag hydroxide-simeth (MAALOX/MYLANTA) 200-200-20 MG/5ML suspension 30 mL (has no administration in time range)  clonazePAM (KLONOPIN) tablet 1 mg (1 mg Oral Given 06/07/18 2231)  FLUoxetine (PROZAC) capsule 40 mg (has no administration in time range)     Initial Impression / Assessment and Plan / ED Course  I have reviewed the triage vital signs and the nursing notes.  Pertinent labs & imaging results that were available  during my care of the patient were reviewed by me and considered in my medical decision making (see chart for details).     Patient here under involuntary commitment.  Her current C waw score is calculated at 10 however the patient is visibly tremulous with tachycardia and market hypertension.  Believe these are all related to alcohol dependence and withdrawal.  Currently the patient will be treated medically to reduce her withdrawal symptoms.  She may need medical admission however we will try to control this and if possible have a psychiatry consult.   Patient vital signs normalized as well with oral benzodiazepines therefore I feel she is safe for psychiatric inpatient detox and does not need hospital admission.  I have performed the  first examination per IVC paperwork.  Patient has been recommended for inpatient treatment by psychiatry she is stable throughout her ER visit  Final Clinical Impressions(s) / ED Diagnoses   Final diagnoses:  Substance abuse [facility])  Alcohol abuse    ED Discharge Orders    None       Margarita Mail, PA-C 06/08/18 0022    Maudie Flakes, MD 06/08/18 1558

## 2018-06-07 NOTE — ED Triage Notes (Signed)
EMS-states husband took out papers due to patient drinking excessively-sates she was here recently but left-

## 2018-06-07 NOTE — BH Assessment (Signed)
Assessment Note  Dorothy [name] is an 62 y.o. female who presented to [name] on IVC petitioned by her husband for her alcoholism/excessive drinking and her suicidal statements.  Patient states that she lost her job a year ago and states that she has not been able to get another one and her drinking has increased over the past year and she states that she has been drinking a bottle of wine and vodka daily to self-medicate her depression.  Patient states that she has severe depression and anxiety that is being treated for her PCP.  She states that she is prescribed Klonopin and Prozac, but it is not really helping.  Family states that patient has made statements about not wanting to live anymore, going to be in a better place and she has been very confused and disoriented when she has been drinking and passing out and urinating in the floor.  Patient states that she does not remember making statements about wanting to die, but states that when she is drinking that she is not aware of what she is doing.  Patient states that she does not want to die, but admits that she has medical issues that are compromised because of her drinking and she knows that she is going to die if she does not stop drinking.  Patient denies other substance use, she denies HI/Psychosis.  Patient states that she went for her doctor's appointment yesterday and states that she was drunk.  She states that her doctor was concerned and felt like she needed help so he had another physician drive her to the hospital.  She states that once she arrived here that she demanded to leave which led to her husband taking out an IVC on her.  Patient states that losing her jog as the [employer] and not being able to get another job has been the main trigger for her depression and alcohol use.  Patient states that she has been married for thirty-four years and is concerned that her drinking has compromised her marriage to  the point that it is going to end.  Patient has a 54 year old son and a 39 year old grand-daughter that means a lot to her.  Patient states: "I hope I have not blown it."  Patient denies any history of abuse or self-mutilation.  Patient presents as alert and oriented.  Her remote memory is intact , but recent is impaired.  Patient's thoughts are organized and her speech is clear and coherent.  Patient's judgment, insight and impulse control are all impaired.  Patient has a depressed mood and is moderately anxious.  Her psycho-motor activity is normal.  Patient is clean and neat.  Her eye contact is good.  Patient does not appear to be responding to internal stimuli.  Patient denies any current withdrawal symptoms other than mild tremors.  She denies a history of DTs and Seizures.  Patient states that Patient is very cooperative and pleasant. Patient states that she has not been able to sleep latey and , but states that her appetite is good.  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  Past Medical History:  Past Medical History:  Diagnosis Date  . Alcoholic ([place])   . Chronic bronchitis (HCC)    Still smoking  . Depression   . GERD (gastroesophageal reflux disease)   . Hoarseness 06/2010   Oral thrush---- Dr. Melida Quitter  .  Hypertension   . Insomnia   . Malignant melanoma ([place]) 2009   Removed from left shoulder    Past Surgical History:  Procedure Laterality Date  . BASAL CELL CARCINOMA EXCISION  2012   Right side nose   . BREAST ENHANCEMENT SURGERY  2002  . ELBOW SURGERY     Right elbow surgery---Ruptured tendon    Family History:  Family History  Problem Relation Age of Onset  . Cirrhosis Father     Social History:  reports that she has been smoking cigarettes. She has been smoking about 1.00 pack per day. She has never used smokeless tobacco. She reports that she drinks alcohol. She reports that she  has current or past drug history.  Additional Social History:  Alcohol / Drug Use Pain Medications: see MAR Prescriptions: see MAR Over the Counter: see MAR History of alcohol / drug use?: Yes Longest period of sobriety (when/how long): sober for 1 week in the past year Negative Consequences of Use: Personal relationships, Work / School Withdrawal Symptoms: Tremors Substance #1 Name of Substance 1: alcohol 1 - Age of First Use: 18 1 - Amount (size/oz): bottle of wine and vodka 1 - Frequency: daily 1 - Duration: for past year 1 - Last Use / Amount: last use was yesterday  CIWA: CIWA-Ar BP: (!) 181/99 Pulse Rate: 86 Nausea and Vomiting: mild nausea with no vomiting Tactile Disturbances: very mild itching, pins and needles, burning or numbness Tremor: moderate, with patient's arms extended Auditory Disturbances: very mild harshness or ability to frighten Paroxysmal Sweats: no sweat visible Visual Disturbances: very mild sensitivity Anxiety: two Headache, Fullness in Head: none present Agitation: normal activity Orientation and Clouding of Sensorium: oriented and can do serial additions CIWA-Ar Total: 10 COWS:    Allergies: No Known Allergies  Home Medications:  (Not in a hospital admission)  OB/GYN Status:  No LMP recorded. Patient is postmenopausal.  General Assessment Data TTS Assessment: In system Is this a Tele or Face-to-Face Assessment?: Face-to-Face Is this an Initial Assessment or a Re-assessment for this encounter?: Initial Assessment Patient Accompanied by:: N/A Language Other than English: No Living Arrangements: Other (Comment)(in home with husband) What gender do you identify as?: Female Marital status: Married Pharmacist, community name: Tamala Julian) Pregnancy Status: No Living Arrangements: Spouse/significant other Can pt return to current living arrangement?: Yes Admission Status: Involuntary Petitioner: Other(husband) Is patient capable of signing voluntary  admission?: Yes Referral Source: Self/Family/Friend Insurance type: (self-pay)     Crisis Care Plan Living Arrangements: Spouse/significant other Legal Guardian: Other:(self) Name of Psychiatrist: none Name of Therapist: none  Education Status Is patient currently in school?: No Is the patient employed, unemployed or receiving disability?: Unemployed  Risk to self with the past 6 months Suicidal Ideation: Yes-Currently Present(family states that she has made suicidal statements) Has patient been a risk to self within the past 6 months prior to admission? : No Suicidal Intent: No Has patient had any suicidal intent within the past 6 months prior to admission? : No Is patient at risk for suicide?: No Suicidal Plan?: No Has patient had any suicidal plan within the past 6 months prior to admission? : No Access to Means: No What has been your use of drugs/alcohol within the last 12 months?: daily alcohol use Previous Attempts/Gestures: No How many times?: 0 Other Self Harm Risks: (isolation, marital conflict and drinking ETOH) Triggers for Past Attempts: None known Intentional Self Injurious Behavior: None Family Suicide History: No Recent stressful life event(s): Job Loss Persecutory  voices/beliefs?: No Depression: Yes Depression Symptoms: Despondent, Insomnia, Tearfulness, Isolating, Loss of interest in usual pleasures, Feeling worthless/self pity Substance abuse history and/or treatment for substance abuse?: No Suicide prevention information given to non-admitted patients: Not applicable  Risk to Others within the past 6 months Homicidal Ideation: No Does patient have any lifetime risk of violence toward others beyond the six months prior to admission? : No Thoughts of Harm to Others: No Current Homicidal Intent: No Current Homicidal Plan: No Access to Homicidal Means: No Identified Victim: none History of harm to others?: No Assessment of Violence: None Noted Violent  Behavior Description: none Does patient have access to weapons?: (unknown) Criminal Charges Pending?: No Does patient have a court date: No Is patient on probation?: No  Psychosis Hallucinations: None noted Delusions: None noted  Mental Status Report Appearance/Hygiene: Unremarkable Eye Contact: Good Motor Activity: Freedom of movement Speech: Logical/coherent Level of Consciousness: Alert Mood: Depressed, Anxious Affect: Depressed Anxiety Level: Severe Thought Processes: Coherent, Relevant Judgement: Impaired Orientation: Person, Place, Time, Situation Obsessive Compulsive Thoughts/Behaviors: Severe(obsession to drink)  Cognitive Functioning Concentration: Decreased Memory: Remote Intact, Recent Impaired Is patient IDD: No Insight: Poor Impulse Control: Poor Appetite: Good Have you had any weight changes? : No Change Sleep: Decreased Total Hours of Sleep: (not sleeping) Vegetative Symptoms: Decreased grooming  ADLScreening [facility] Assessment Services) Patient's cognitive ability adequate to safely complete daily activities?: Yes Patient able to express need for assistance with ADLs?: Yes Independently performs ADLs?: Yes (appropriate for developmental age)  Prior Inpatient Therapy Prior Inpatient Therapy: No  Prior Outpatient Therapy Prior Outpatient Therapy: No Does patient have an ACCT team?: No Does patient have Intensive In-House Services?  : No Does patient have [facility] services? : No Does patient have P4CC services?: No  ADL Screening (condition at time of admission) Patient's cognitive ability adequate to safely complete daily activities?: Yes Is the patient deaf or have difficulty hearing?: No Does the patient have difficulty seeing, even when wearing glasses/contacts?: No Does the patient have difficulty concentrating, remembering, or making decisions?: No Patient able to express need for assistance with ADLs?: Yes Does the patient have difficulty  dressing or bathing?: No Independently performs ADLs?: Yes (appropriate for developmental age) Does the patient have difficulty walking or climbing stairs?: No Weakness of Legs: None Weakness of Arms/Hands: None     Therapy Consults (therapy consults require a physician order) PT Evaluation Needed: No OT Evalulation Needed: No SLP Evaluation Needed: No Abuse/Neglect Assessment (Assessment to be complete while patient is alone) Abuse/Neglect Assessment Can Be Completed: Yes Physical Abuse: Denies Verbal Abuse: Denies Sexual Abuse: Denies Exploitation of patient/patient's resources: Denies Self-Neglect: Denies Values / Beliefs Cultural Requests During Hospitalization: None Spiritual Requests During Hospitalization: None Consults Spiritual Care Consult Needed: No Social Work Consult Needed: No Regulatory affairs officer (For Healthcare) Does Patient Have a Medical Advance Directive?: No Would patient like information on creating a medical advance directive?: No - Patient declined Nutrition Screen- [facility] Adult/WL/AP Has the patient recently lost weight without trying?: No Has the patient been eating poorly because of a decreased appetite?: No Malnutrition Screening Tool Score: 0   Disposition Per Priscille Loveless, NP, Patient is recommended for inpatient treatment and [facility] is reviewing for admission     Disposition Initial Assessment Completed for this Encounter: Yes Disposition of Patient: Admit Type of inpatient treatment program: Adult  On Site Evaluation by:   Reviewed with Physician:    Judeth Porch Taylia Berber 06/07/2018 6:36 PM

## 2018-06-07 NOTE — ED Notes (Signed)
Pt resting at present, no distress noted, calm & cooperative.  A&O x 3, monitoring for safety, sitter at bedside.

## 2018-06-08 ENCOUNTER — Other Ambulatory Visit: Payer: Self-pay

## 2018-06-08 ENCOUNTER — Inpatient Hospital Stay (HOSPITAL_COMMUNITY)
Admission: AD | Admit: 2018-06-08 | Discharge: 2018-06-13 | DRG: 885 | Disposition: A | Discharge status: Home / Self Care | Payer: Federal, State, Local not specified - Other | Source: Intra-hospital | Attending: Psychiatry | Admitting: Psychiatry

## 2018-06-08 ENCOUNTER — Encounter (HOSPITAL_COMMUNITY): Payer: Self-pay

## 2018-06-08 DIAGNOSIS — F419 Anxiety disorder, unspecified: Secondary | ICD-10-CM | POA: Diagnosis not present

## 2018-06-08 DIAGNOSIS — Z8379 Family history of other diseases of the digestive system: Secondary | ICD-10-CM | POA: Diagnosis not present

## 2018-06-08 DIAGNOSIS — G4700 Insomnia, unspecified: Secondary | ICD-10-CM | POA: Diagnosis not present

## 2018-06-08 DIAGNOSIS — F1290 Cannabis use, unspecified, uncomplicated: Secondary | ICD-10-CM | POA: Diagnosis present

## 2018-06-08 DIAGNOSIS — F332 Major depressive disorder, recurrent severe without psychotic features: Secondary | ICD-10-CM | POA: Diagnosis not present

## 2018-06-08 DIAGNOSIS — Z818 Family history of other mental and behavioral disorders: Secondary | ICD-10-CM | POA: Diagnosis not present

## 2018-06-08 DIAGNOSIS — Z79899 Other long term (current) drug therapy: Secondary | ICD-10-CM | POA: Diagnosis not present

## 2018-06-08 DIAGNOSIS — F1020 Alcohol dependence, uncomplicated: Secondary | ICD-10-CM | POA: Diagnosis not present

## 2018-06-08 DIAGNOSIS — F17210 Nicotine dependence, cigarettes, uncomplicated: Secondary | ICD-10-CM | POA: Diagnosis present

## 2018-06-08 DIAGNOSIS — R45851 Suicidal ideations: Secondary | ICD-10-CM | POA: Diagnosis present

## 2018-06-08 DIAGNOSIS — F10239 Alcohol dependence with withdrawal, unspecified: Secondary | ICD-10-CM | POA: Diagnosis present

## 2018-06-08 DIAGNOSIS — Z560 Unemployment, unspecified: Secondary | ICD-10-CM | POA: Diagnosis not present

## 2018-06-08 DIAGNOSIS — Z85820 Personal history of malignant melanoma of skin: Secondary | ICD-10-CM | POA: Diagnosis not present

## 2018-06-08 MED ORDER — LORAZEPAM 1 MG PO TABS
1.0000 mg | ORAL_TABLET | Freq: Three times a day (TID) | ORAL | Status: AC
  Administered 2018-06-09 (×2): 1 mg via ORAL
  Filled 2018-06-08: qty 1
  Filled 2018-06-08: qty 2

## 2018-06-08 MED ORDER — ADULT MULTIVITAMIN W/MINERALS CH
1.0000 | ORAL_TABLET | Freq: Every day | ORAL | Status: DC
  Administered 2018-06-08 – 2018-06-13 (×6): 1 via ORAL
  Filled 2018-06-08 (×9): qty 1

## 2018-06-08 MED ORDER — LORAZEPAM 1 MG PO TABS
1.0000 mg | ORAL_TABLET | Freq: Two times a day (BID) | ORAL | Status: AC
  Administered 2018-06-10 (×2): 1 mg via ORAL
  Filled 2018-06-08 (×2): qty 1

## 2018-06-08 MED ORDER — VITAMIN B-1 100 MG PO TABS
100.0000 mg | ORAL_TABLET | Freq: Every day | ORAL | Status: DC
  Administered 2018-06-09 – 2018-06-13 (×5): 100 mg via ORAL
  Filled 2018-06-08 (×7): qty 1

## 2018-06-08 MED ORDER — LOPERAMIDE HCL 2 MG PO CAPS: 2.0000 mg | ORAL_CAPSULE | ORAL | Status: AC | PRN

## 2018-06-08 MED ORDER — HYDROXYZINE HCL 25 MG PO TABS: 25.0000 mg | ORAL_TABLET | Freq: Four times a day (QID) | ORAL | Status: DC | PRN

## 2018-06-08 MED ORDER — MAGNESIUM HYDROXIDE 400 MG/5ML PO SUSP: 30.0000 mL | Freq: Every day | ORAL | Status: DC | PRN

## 2018-06-08 MED ORDER — TRAZODONE HCL 50 MG PO TABS
50.0000 mg | ORAL_TABLET | Freq: Every evening | ORAL | Status: DC | PRN
  Administered 2018-06-08 – 2018-06-11 (×4): 50 mg via ORAL
  Filled 2018-06-08 (×2): qty 1

## 2018-06-08 MED ORDER — PNEUMOCOCCAL VAC POLYVALENT 25 MCG/0.5ML IJ INJ: 0.5000 mL | INJECTION | INTRAMUSCULAR | Status: DC

## 2018-06-08 MED ORDER — LORAZEPAM 1 MG PO TABS
1.0000 mg | ORAL_TABLET | Freq: Every day | ORAL | Status: AC
  Administered 2018-06-11: 1 mg via ORAL
  Filled 2018-06-08: qty 1

## 2018-06-08 MED ORDER — ACETAMINOPHEN 325 MG PO TABS: 650.0000 mg | ORAL_TABLET | Freq: Four times a day (QID) | ORAL | Status: DC | PRN

## 2018-06-08 MED ORDER — LORAZEPAM 1 MG PO TABS: 1.0000 mg | ORAL_TABLET | Freq: Four times a day (QID) | ORAL | Status: AC | PRN

## 2018-06-08 MED ORDER — TRAZODONE HCL 50 MG PO TABS: 50.0000 mg | ORAL_TABLET | Freq: Every evening | ORAL | Status: DC | PRN

## 2018-06-08 MED ORDER — FLUOXETINE HCL 20 MG PO CAPS
40.0000 mg | ORAL_CAPSULE | Freq: Every day | ORAL | Status: DC
  Administered 2018-06-08 – 2018-06-13 (×6): 40 mg via ORAL
  Filled 2018-06-08 (×3): qty 2
  Filled 2018-06-08: qty 14
  Filled 2018-06-08 (×5): qty 2

## 2018-06-08 MED ORDER — NICOTINE 21 MG/24HR TD PT24
21.0000 mg | MEDICATED_PATCH | Freq: Every day | TRANSDERMAL | Status: DC
  Administered 2018-06-08 – 2018-06-13 (×6): 21 mg via TRANSDERMAL
  Filled 2018-06-08 (×9): qty 1

## 2018-06-08 MED ORDER — ALUM & MAG HYDROXIDE-SIMETH 200-200-20 MG/5ML PO SUSP: 30.0000 mL | ORAL | Status: DC | PRN

## 2018-06-08 MED ORDER — LORAZEPAM 1 MG PO TABS
1.0000 mg | ORAL_TABLET | Freq: Four times a day (QID) | ORAL | Status: AC
  Administered 2018-06-08 (×3): 1 mg via ORAL
  Filled 2018-06-08 (×3): qty 1

## 2018-06-08 MED ORDER — ARIPIPRAZOLE 5 MG PO TABS
5.0000 mg | ORAL_TABLET | Freq: Every day | ORAL | Status: DC
  Administered 2018-06-08 – 2018-06-13 (×6): 5 mg via ORAL
  Filled 2018-06-08: qty 1
  Filled 2018-06-08: qty 7
  Filled 2018-06-08 (×7): qty 1

## 2018-06-08 MED ORDER — HYDROXYZINE HCL 50 MG PO TABS
50.0000 mg | ORAL_TABLET | Freq: Four times a day (QID) | ORAL | Status: DC | PRN
  Administered 2018-06-09 – 2018-06-12 (×4): 50 mg via ORAL
  Filled 2018-06-08: qty 1
  Filled 2018-06-08: qty 10
  Filled 2018-06-08 (×2): qty 1

## 2018-06-08 MED ORDER — ONDANSETRON 4 MG PO TBDP: 4.0000 mg | ORAL_TABLET | Freq: Four times a day (QID) | ORAL | Status: AC | PRN

## 2018-06-08 NOTE — Progress Notes (Signed)
Patient presents with anxious affect and behavior during admission interview and assessment. VS monitored and recorded. Skin check performed with Kedra MHT and revealed healing abrasions to left elbow and right knee. Contraband was not found. Patient was oriented to unit and schedule. Pt states "I am here to get some rest and to get off of cigarettes and alcohol". Pt denies SI/HI/AVH at this time. PO fluids provided. Safety maintained. Rest encouraged.

## 2018-06-08 NOTE — Tx Team (Signed)
Interdisciplinary Treatment and Diagnostic Plan Update  06/08/2018 Time of Session: 0830AM Dorothy [name] MRN: [id number]  Principal Diagnosis: MDD  Secondary Diagnoses: Active Problems:   Severe recurrent major depression without psychotic features (HCC)   Current Medications:  Current Facility-Administered Medications  Medication Dose Route Frequency Provider Last Rate Last Dose  . acetaminophen (TYLENOL) tablet 650 mg  650 mg Oral Q6H PRN Rozetta Nunnery, NP      . alum & mag hydroxide-simeth (MAALOX/MYLANTA) 200-200-20 MG/5ML suspension 30 mL  30 mL Oral Q4H PRN Lindon Romp A, NP      . hydrOXYzine (ATARAX/VISTARIL) tablet 25 mg  25 mg Oral Q6H PRN Lindon Romp A, NP      . loperamide (IMODIUM) capsule 2-4 mg  2-4 mg Oral PRN Lindon Romp A, NP      . LORazepam (ATIVAN) tablet 1 mg  1 mg Oral Q6H PRN Lindon Romp A, NP      . LORazepam (ATIVAN) tablet 1 mg  1 mg Oral QID Lindon Romp A, NP   1 mg at 06/08/18 0752   Followed by  . [START ON 06/09/2018] LORazepam (ATIVAN) tablet 1 mg  1 mg Oral TID Rozetta Nunnery, NP       Followed by  . [START ON 06/10/2018] LORazepam (ATIVAN) tablet 1 mg  1 mg Oral BID Rozetta Nunnery, NP       Followed by  . [START ON 06/11/2018] LORazepam (ATIVAN) tablet 1 mg  1 mg Oral Daily Lindon Romp A, NP      . magnesium hydroxide (MILK OF MAGNESIA) suspension 30 mL  30 mL Oral Daily PRN Lindon Romp A, NP      . multivitamin with minerals tablet 1 tablet  1 tablet Oral Daily Lindon Romp A, NP   1 tablet at 06/08/18 0752  . ondansetron (ZOFRAN-ODT) disintegrating tablet 4 mg  4 mg Oral Q6H PRN Rozetta Nunnery, NP      . [START ON 06/09/2018] pneumococcal 23 valent vaccine (PNU-IMMUNE) injection 0.5 mL  0.5 mL Intramuscular Tomorrow-1000 Lindon Romp A, NP      . Derrill Memo ON 06/09/2018] thiamine (VITAMIN B-1) tablet 100 mg  100 mg Oral Daily Lindon Romp A, NP      . traZODone (DESYREL) tablet 50 mg  50 mg Oral QHS PRN Rozetta Nunnery, NP       PTA  Medications: Medications Prior to Admission  Medication Sig Dispense Refill Last Dose  . clonazePAM (KLONOPIN) 1 MG tablet Take 1 mg by mouth daily.  0 06/07/2018 at Unknown time  . FLUoxetine (PROZAC) 20 MG capsule Take 40 mg by mouth every morning.  3 06/07/2018 at Unknown time    Patient Stressors:    Patient Strengths:    Treatment Modalities: Medication Management, Group therapy, Case management,  1 to 1 session with clinician, Psychoeducation, Recreational therapy.   Physician Treatment Plan for Primary Diagnosis: MDD  Medication Management: Evaluate patient's response, side effects, and tolerance of medication regimen.  Therapeutic Interventions: 1 to 1 sessions, Unit Group sessions and Medication administration.  Evaluation of Outcomes: Not Met  Physician Treatment Plan for Secondary Diagnosis: Active Problems:   Severe recurrent major depression without psychotic features ([name])   Medication Management: Evaluate patient's response, side effects, and tolerance of medication regimen.  Therapeutic Interventions: 1 to 1 sessions, Unit Group sessions and Medication administration.  Evaluation of Outcomes: Not Met   RN Treatment Plan for Primary Diagnosis: MDD Long Term Goal(s): Knowledge  of disease and therapeutic regimen to maintain health will improve  Short Term Goals: Ability to remain free from injury will improve, Ability to demonstrate self-control, Ability to participate in decision making will improve and Ability to disclose and discuss suicidal ideas  Medication Management: RN will administer medications as ordered by provider, will assess and evaluate patient's response and provide education to patient for prescribed medication. RN will report any adverse and/or side effects to prescribing provider.  Therapeutic Interventions: 1 on 1 counseling sessions, Psychoeducation, Medication administration, Evaluate responses to treatment, Monitor vital signs and CBGs as  ordered, Perform/monitor CIWA, COWS, AIMS and Fall Risk screenings as ordered, Perform wound care treatments as ordered.  Evaluation of Outcomes: Not Met   LCSW Treatment Plan for Primary Diagnosis: MDD Long Term Goal(s): Safe transition to appropriate next level of care at discharge, Engage patient in therapeutic group addressing interpersonal concerns.  Short Term Goals: Engage patient in aftercare planning with referrals and resources, Increase emotional regulation, Facilitate patient progression through stages of change regarding substance use diagnoses and concerns and Identify triggers associated with mental health/substance abuse issues  Therapeutic Interventions: Assess for all discharge needs, 1 to 1 time with Social worker, Explore available resources and support systems, Assess for adequacy in community support network, Educate family and significant other(s) on suicide prevention, Complete Psychosocial Assessment, Interpersonal group therapy.  Evaluation of Outcomes: Not Met   Progress in Treatment: Attending groups: No. New to unit. Continuing to assess.  Participating in groups: No. Taking medication as prescribed: Yes. Toleration medication: Yes. Family/Significant other contact made: No, will contact:  family member if pt consents to collateral contact Patient understands diagnosis: Yes. Discussing patient identified problems/goals with staff: Yes. Medical problems stabilized or resolved: Yes. Denies suicidal/homicidal ideation: Yes. Issues/concerns per patient self-inventory: No. Other: n/a  New problem(s) identified: No, Describe:  n/a  New Short Term/Long Term Goal(s): detox, medication management for mood stabilization; elimination of SI thoughts; development of comprehensive mental wellness/sobriety plan.   Patient Goals:  "to learn ways to stop drinking and smoking cigarettes."   Discharge Plan or Barriers: CSW assessing for appropriate referrals. Pt reports  that her PCP prescribes her mental health medication. [place] pamphlet, Mobile Crisis information, and AA/NA information provided to patient for additional community support and resources.    Reason for Continuation of Hospitalization: Anxiety Depression Medication stabilization Suicidal ideation Withdrawal symptoms  Estimated Length of Stay: Monday, 06/13/18  Attendees: Patient: 06/08/2018 8:49 AM  Physician: Dr. Parke Poisson MD; Dr. Nancy Fetter MD 06/08/2018 8:49 AM  Nursing: Yetta Flock RN; [facility] RN 06/08/2018 8:49 AM  RN Care Manager:x 06/08/2018 8:49 AM  Social Worker: Janice Norrie LCSW 06/08/2018 8:49 AM  Recreational Therapist: x 06/08/2018 8:49 AM  Other: Lindell Spar NP; Darnelle Maffucci Money NP 06/08/2018 8:49 AM  Other:  06/08/2018 8:49 AM  Other: 06/08/2018 8:49 AM    Scribe for Treatment Team: Avelina Laine, LCSW 06/08/2018 8:49 AM

## 2018-06-08 NOTE — Plan of Care (Signed)
  Problem: Coping: Goal: Ability to demonstrate self-control will improve Outcome: Progressing   Problem: Education: Goal: Emotional status will improve Outcome: Not Progressing Goal: Mental status will improve Outcome: Not Progressing   Problem: Activity: Goal: Sleeping patterns will improve Outcome: Not Progressing
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
## 2018-06-08 NOTE — Progress Notes (Signed)
Recreation Therapy Notes  Date: 10.16.19 Time: 0930 Location: [address] Dayroom  Group Topic: Stress Management  Goal Area(s) Addresses:  Patient will verbalize importance of using healthy stress management.  Patient will identify positive emotions associated with healthy stress management.   Intervention: Stress Management  Activity :  Meditation.  LRT introduced the stress management technique of meditation.  LRT played a meditation that dealt with resilience.  Patients were to listen and follow along as meditation played.  Education:  Stress Management, Discharge Planning.   Education Outcome: Acknowledges edcuation/In group clarification offered/Needs additional education  Clinical Observations/Feedback: Pt did not attend group.    Victorino Sparrow, LRT/CTRS         Ria Comment, Ernest Orr A 06/08/2018 11:26 AM

## 2018-06-08 NOTE — H&P (Signed)
Psychiatric Admission Assessment Adult  Patient Identification: Dorothy [name] MRN:  [id number] Date of Evaluation:  06/08/2018 Chief Complaint:  MDD recurrent severe,Etoh use disorder severe Principal Diagnosis: Severe recurrent major depression without psychotic features ([place]) Diagnosis:   Patient Active Problem List   Diagnosis Date Noted  . Severe recurrent major depression without psychotic features ([place]) [F33.2] 06/08/2018  . Alcohol use disorder, severe, dependence ([place]) [F10.20] 06/08/2018   History of Present Illness:   Dorothy [name] is a 62 y/o F with history of treatment for depression and alcohol use who was admitted from [facility] on IVC initiated by her husband due to worsening symptoms of depression, SI without plan, and worsening use of alcohol. Pt had been evaluated in [facility] one day prior to this admission after she was sent there via EMS from her PCP appointment where she arrived intoxicated, but she left AMA from the waiting room. Pt was medically cleared, started on alcohol withdrawal protocol with ativan, and she was transferred to [facility] for additional treatment and evaluation.  Upon initial interview, pt shares, "I was starting to drink all day. It was getting worse and worse." Pt reports that she has struggled with alcohol use for several years but in the past 1 year her drinking has significantly worsened, mostly associated with worsening depression and stressor of losing her job about 1 year ago and being unable to find a new job. Pt reports she is consuming about 1 bottle of wine and 1 pint of vodka daily, and she drinks from the moment she wakes up until she goes to sleep. She endorses depression symptoms of depressed mood, initial insomnia, anhedonia, guilty feelings, and psychomotor retardation. She denies SI, and states, "If I said anything like that, I was drunk." She denies HI/AH/VH. She reports minimal to no sleep for the 3 nights prior to admission, but she denies  previous episodes with decreased need for sleep and she denies all other symptoms of bipolar mania. She denies symptoms of OCD and PTSD. She smokes 2ppd and uses cannabis about 1-2x/month. She denies other illicit substance.  Discussed with patient about treatment options. She started trial of prozac 43m po qDay about 2 weeks ago, and she has not noticed any benefit. She also takes klonopin 130mpo qhs to help with insomnia. Discussed with patient about risks of combining alcohol and benzodiazepines, and pt was in agreement to stop klonopin and attempt trial of trazodone for treatment of insomnia. Pt would like to stay on prozac to give time for an adequate trial, and we discussed adding a trial of abilify to help augment prozac. Pt is open to referral to substance use treatment for alcohol, and she will discuss about available options with the SW team. She was in agreement with the above plan, and she had no further questions, comments, or concerns.   Associated Signs/Symptoms: Depression Symptoms:  depressed mood, anhedonia, insomnia, psychomotor retardation, feelings of worthlessness/guilt, hopelessness, suicidal thoughts without plan, anxiety, (Hypo) Manic Symptoms:  NA Anxiety Symptoms:  Excessive Worry, Psychotic Symptoms:  NA PTSD Symptoms: NA Total Time spent with patient: 1 hour  Past Psychiatric History:   -treatment for depression by PCP - no hx of inpatient stays - no previous formal diagnoses - no history of suicide attempts   Is the patient at risk to self? Yes.    Has the patient been a risk to self in the past 6 months? Yes.    Has the patient been a risk to self within the  distant past? Yes.    Is the patient a risk to others? Yes.    Has the patient been a risk to others in the past 6 months? Yes.    Has the patient been a risk to others within the distant past? Yes.     Prior Inpatient Therapy:   Prior Outpatient Therapy:    Alcohol Screening: 1. How often do  you have a drink containing alcohol?: 4 or more times a week 2. How many drinks containing alcohol do you have on a typical day when you are drinking?: 7, 8, or 9 3. How often do you have six or more drinks on one occasion?: Daily or almost daily AUDIT-C Score: 11 4. How often during the last year have you found that you were not able to stop drinking once you had started?: Monthly 5. How often during the last year have you failed to do what was normally expected from you becasue of drinking?: Monthly 6. How often during the last year have you needed a first drink in the morning to get yourself going after a heavy drinking session?: Monthly 7. How often during the last year have you had a feeling of guilt of remorse after drinking?: Monthly 8. How often during the last year have you been unable to remember what happened the night before because you had been drinking?: Monthly 9. Have you or someone else been injured as a result of your drinking?: No 10. Has a relative or friend or a doctor or another health worker been concerned about your drinking or suggested you cut down?: Yes, but not in the last year Alcohol Use Disorder Identification Test Final Score (AUDIT): 23 Intervention/Follow-up: Alcohol Education Substance Abuse History in the last 12 months:  Yes.   Consequences of Substance Abuse: Medical Consequences:  worsened mood symptoms Previous Psychotropic Medications: Yes  Psychological Evaluations: No  Past Medical History:  Past Medical History:  Diagnosis Date  . Alcoholic ([place])   . Chronic bronchitis ([facility])    Still smoking  . Depression   . GERD (gastroesophageal reflux disease)   . Hoarseness 06/2010   Oral thrush---- Dr. Melida Quitter  . Hypertension   . Insomnia   . Malignant melanoma ([place]) 2009   Removed from left shoulder    Past Surgical History:  Procedure Laterality Date  . BASAL CELL CARCINOMA EXCISION  2012   Right side nose   . BREAST ENHANCEMENT SURGERY   2002  . ELBOW SURGERY     Right elbow surgery---Ruptured tendon   Family History:  Family History  Problem Relation Age of Onset  . Cirrhosis Father    Family Psychiatric  History: mother hx of depression. No fam hx of suicide attempt/completions. Tobacco Screening:   Social History: Pt was born and raised in [place]. She lives with her husband in [place]. She completed some college. 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 She has 1 adult son (age 75) and one grandson (age 58). She denies legal and trauma history. Social History   Substance and Sexual Activity  Alcohol Use Yes   Comment: >1 pint vodka daily and wine     Social History   Substance and Sexual Activity  Drug Use Yes  . Types: Marijuana    Additional Social History: Marital status: Married Number of Years Married: 73 What types of issues is patient dealing with in  the relationship?: husband is supportive of me being here. Additional relationship information: issues with alcohol have been getting in the way of marriage lately Are you sexually active?: Yes What is your sexual orientation?: heterosexual Has your sexual activity been affected by drugs, alcohol, medication, or emotional stress?: n/a Does patient have children?: Yes How many children?: 1 How is patient's relationship with their children?: 29 yo daughter who lives in [place]. 57yo granddaughter named Engineer, agricultural. "I see them every 2 weeks. Not enough."                         Allergies:  No Known Allergies Lab Results:  Results for orders placed or performed during the hospital encounter of 06/07/18 (from the past 48 hour(s))  Comprehensive metabolic panel     Status: Abnormal   Collection Time: 06/07/18  1:22 PM  Result Value Ref Range   Sodium 133 (L) 135 - 145 mmol/L   Potassium 4.2 3.5 - 5.1 mmol/L   Chloride 94 (L) 98 - 111 mmol/L   CO2  24 22 - 32 mmol/L   Glucose, Bld 84 70 - 99 mg/dL   BUN 18 6 - 20 mg/dL   Creatinine, Ser 0.68 0.44 - 1.00 mg/dL   Calcium 9.4 8.9 - 10.3 mg/dL   Total Protein 6.9 6.5 - 8.1 g/dL   Albumin 4.2 3.5 - 5.0 g/dL   AST 90 (H) 15 - 41 U/L   ALT 63 (H) 0 - 44 U/L   Alkaline Phosphatase 71 38 - 126 U/L   Total Bilirubin 1.6 (H) 0.3 - 1.2 mg/dL   GFR calc non Af Amer >60 >60 mL/min   GFR calc Af Amer >60 >60 mL/min    Comment: (NOTE) The eGFR has been calculated using the CKD EPI equation. This calculation has not been validated in all clinical situations. eGFR's persistently <60 mL/min signify possible Chronic Kidney Disease.    Anion gap 15 5 - 15    Comment: Performed at [facility], [place] [address], [place], [place] [address]  cbc     Status: Abnormal   Collection Time: 06/07/18  1:22 PM  Result Value Ref Range   WBC 12.4 (H) 4.0 - 10.5 K/uL   RBC 4.37 3.87 - 5.11 MIL/uL   Hemoglobin 14.9 12.0 - 15.0 g/dL   HCT 43.9 36.0 - 46.0 %   MCV 100.5 (H) 80.0 - 100.0 fL   MCH 34.1 (H) 26.0 - 34.0 pg   MCHC 33.9 30.0 - 36.0 g/dL   RDW 14.1 11.5 - 15.5 %   Platelets 229 150 - 400 K/uL   nRBC 0.0 0.0 - 0.2 %    Comment: Performed at [facility], [place] [address], [place], [place] [address]  Ethanol     Status: None   Collection Time: 06/07/18  1:38 PM  Result Value Ref Range   Alcohol, Ethyl (B) <10 <10 mg/dL    Comment: (NOTE) Lowest detectable limit for serum alcohol is 10 mg/dL. For medical purposes only. Performed at [facility], [place] [address] [place], [place] [address]   Rapid urine drug screen (hospital performed)     Status: Abnormal   Collection Time: 06/07/18  1:38 PM  Result Value Ref Range   Opiates NONE DETECTED NONE DETECTED   Cocaine NONE DETECTED NONE DETECTED   Benzodiazepines POSITIVE (A) NONE DETECTED   Amphetamines NONE DETECTED NONE DETECTED   Tetrahydrocannabinol POSITIVE (A)  NONE DETECTED    Barbiturates NONE DETECTED NONE DETECTED    Comment: (NOTE) DRUG SCREEN FOR MEDICAL PURPOSES ONLY.  IF CONFIRMATION IS NEEDED FOR ANY PURPOSE, NOTIFY LAB WITHIN 5 DAYS. LOWEST DETECTABLE LIMITS FOR URINE DRUG SCREEN Drug Class                     Cutoff (ng/mL) Amphetamine and metabolites    1000 Barbiturate and metabolites    200 Benzodiazepine                 829 Tricyclics and metabolites     300 Opiates and metabolites        300 Cocaine and metabolites        300 THC                            50 Performed at [facility], [place] [address], [place],  [address]     Blood Alcohol level:  Lab Results  Component Value Date   ETH <10 96/78/9381    Metabolic Disorder Labs:  No results found for: HGBA1C, MPG No results found for: PROLACTIN No results found for: CHOL, TRIG, HDL, CHOLHDL, VLDL, LDLCALC  Current Medications: Current Facility-Administered Medications  Medication Dose Route Frequency Provider Last Rate Last Dose  . acetaminophen (TYLENOL) tablet 650 mg  650 mg Oral Q6H PRN Lindon Romp A, NP      . alum & mag hydroxide-simeth (MAALOX/MYLANTA) 200-200-20 MG/5ML suspension 30 mL  30 mL Oral Q4H PRN Lindon Romp A, NP      . ARIPiprazole (ABILIFY) tablet 5 mg  5 mg Oral Daily Pennelope Bracken, MD   5 mg at 06/08/18 1216  . FLUoxetine (PROZAC) capsule 40 mg  40 mg Oral Daily Pennelope Bracken, MD   40 mg at 06/08/18 1216  . hydrOXYzine (ATARAX/VISTARIL) tablet 50 mg  50 mg Oral Q6H PRN Pennelope Bracken, MD      . loperamide (IMODIUM) capsule 2-4 mg  2-4 mg Oral PRN Lindon Romp A, NP      . LORazepam (ATIVAN) tablet 1 mg  1 mg Oral Q6H PRN Lindon Romp A, NP      . LORazepam (ATIVAN) tablet 1 mg  1 mg Oral QID Lindon Romp A, NP   1 mg at 06/08/18 1158   Followed by  . [START ON 06/09/2018] LORazepam (ATIVAN) tablet 1 mg  1 mg Oral TID Rozetta Nunnery, NP       Followed by  . [START ON 06/10/2018] LORazepam (ATIVAN)  tablet 1 mg  1 mg Oral BID Rozetta Nunnery, NP       Followed by  . [START ON 06/11/2018] LORazepam (ATIVAN) tablet 1 mg  1 mg Oral Daily Lindon Romp A, NP      . magnesium hydroxide (MILK OF MAGNESIA) suspension 30 mL  30 mL Oral Daily PRN Lindon Romp A, NP      . multivitamin with minerals tablet 1 tablet  1 tablet Oral Daily Lindon Romp A, NP   1 tablet at 06/08/18 0752  . nicotine (NICODERM CQ - dosed in mg/24 hours) patch 21 mg  21 mg Transdermal Daily Pennelope Bracken, MD   21 mg at 06/08/18 1216  . ondansetron (ZOFRAN-ODT) disintegrating tablet 4 mg  4 mg Oral Q6H PRN Rozetta Nunnery, NP      . [START ON 06/09/2018] pneumococcal 23 valent vaccine (  PNU-IMMUNE) injection 0.5 mL  0.5 mL Intramuscular Tomorrow-1000 Lindon Romp A, NP      . Derrill Memo ON 06/09/2018] thiamine (VITAMIN B-1) tablet 100 mg  100 mg Oral Daily Lindon Romp A, NP      . traZODone (DESYREL) tablet 50 mg  50 mg Oral QHS PRN,MR X 1 [employer], Randa Ngo, MD       PTA Medications: Medications Prior to Admission  Medication Sig Dispense Refill Last Dose  . clonazePAM (KLONOPIN) 1 MG tablet Take 1 mg by mouth daily.  0 06/07/2018 at Unknown time  . FLUoxetine (PROZAC) 20 MG capsule Take 40 mg by mouth every morning.  3 06/07/2018 at Unknown time    Musculoskeletal: Strength & Muscle Tone: within normal limits Gait & Station: normal Patient leans: N/A  Psychiatric Specialty Exam: Physical Exam  Nursing note and vitals reviewed.   Review of Systems  Constitutional: Negative for chills and fever.  Respiratory: Negative for cough and shortness of breath.   Cardiovascular: Negative for chest pain.  Gastrointestinal: Negative for abdominal pain, heartburn, nausea and vomiting.  Psychiatric/Behavioral: Negative for depression, hallucinations and suicidal ideas. The patient is not nervous/anxious and does not have insomnia.     Blood pressure (!) 132/93, pulse 87, temperature 98.2 F (36.8 C), temperature  source Oral, resp. rate 20, height '5\' 6"'  (1.676 m), weight 56.7 kg, SpO2 100 %.Body mass index is 20.18 kg/m.  General Appearance: Casual and Fairly Groomed  Eye Contact:  Good  Speech:  Clear and Coherent and Normal Rate  Volume:  Normal  Mood:  Anxious and Depressed  Affect:  Congruent, Constricted and Depressed  Thought Process:  Coherent and Goal Directed  Orientation:  Full (Time, Place, and Person)  Thought Content:  Logical  Suicidal Thoughts:  No  Homicidal Thoughts:  No  Memory:  Immediate;   Fair Recent;   Fair Remote;   Fair  Judgement:  Fair  Insight:  Fair  Psychomotor Activity:  Normal  Concentration:  Concentration: Fair  Recall:  [employer] of Knowledge:  Fair  Language:  Fair  Akathisia:  No  Handed:    AIMS (if indicated):     Assets:  Resilience Social Support  ADL's:  Intact  Cognition:  WNL  Sleep:  Number of Hours: 1.5   Treatment Plan Summary: Daily contact with patient to assess and evaluate symptoms and progress in treatment and Medication management  Observation Level/Precautions:  15 minute checks  Laboratory:  CBC Chemistry Profile HbAIC UDS UA  Psychotherapy:  Encourage participation in groups and therapeutic milieu   Medications:  Resume prozac 68m po qDay. Start abilify 555mpo qDay. Continue CIWA with ativan. Continue all other current PRN's without changes. Do not resume home med of klonopin.  Consultations:    Discharge Concerns:    Estimated LOS: 5-7 days  Other:     Physician Treatment Plan for Primary Diagnosis: Severe recurrent major depression without psychotic features ([place] Goal(s): Improvement in symptoms so as ready for discharge  Short Term Goals: Ability to identify and develop effective coping behaviors will improve  Physician Treatment Plan for Secondary Diagnosis: Principal Problem:   Severe recurrent major depression without psychotic features ([place] Problems:   Alcohol use disorder, severe,  dependence ([place] Long Term Goal(s): Improvement in symptoms so as ready for discharge  Short Term Goals: Ability to identify triggers associated with substance abuse/mental health issues will improve  I certify that inpatient services furnished can reasonably  be expected to improve the patient's condition.    Pennelope Bracken, MD 10/16/201912:20 PM
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
## 2018-06-08 NOTE — Progress Notes (Signed)
Patient did attend the evening speaker NA meeting.  

## 2018-06-08 NOTE — BHH Suicide Risk Assessment (Signed)
[facility] Admission Suicide Risk Assessment   Nursing information obtained from:  Patient Demographic factors:  Caucasian Current Mental Status:  NA Loss Factors:  NA Historical Factors:  NA Risk Reduction Factors:  Sense of responsibility to family, Living with another person, especially a relative, Positive therapeutic relationship, Positive social support, Positive coping skills or problem solving skills  Total Time spent with patient: 1 hour Principal Problem: Severe recurrent major depression without psychotic features ([place]) Diagnosis:   Patient Active Problem List   Diagnosis Date Noted  . Severe recurrent major depression without psychotic features ([place]) [F33.2] 06/08/2018  . Alcohol use disorder, severe, dependence ([place]) [F10.20] 06/08/2018   Subjective Data: see H&P  Continued Clinical Symptoms:  Alcohol Use Disorder Identification Test Final Score (AUDIT): 23 The "Alcohol Use Disorders Identification Test", Guidelines for Use in Primary Care, Second Edition.  World Pharmacologist [facility]). Score between 0-7:  no or low risk or alcohol related problems. Score between 8-15:  moderate risk of alcohol related problems. Score between 16-19:  high risk of alcohol related problems. Score 20 or above:  warrants further diagnostic evaluation for alcohol dependence and treatment.     Psychiatric Specialty Exam: Physical Exam  Nursing note and vitals reviewed.     Blood pressure (!) 132/93, pulse 87, temperature 98.2 F (36.8 C), temperature source Oral, resp. rate 20, height 5\' 6"  (1.676 m), weight 56.7 kg, SpO2 100 %.Body mass index is 20.18 kg/m.     COGNITIVE FEATURES THAT CONTRIBUTE TO RISK:  None    SUICIDE RISK:   Mild:  Suicidal ideation of limited frequency, intensity, duration, and specificity.  There are no identifiable plans, no associated intent, mild dysphoria and related symptoms, good self-control (both objective and subjective assessment), few other risk  factors, and identifiable protective factors, including available and accessible social support.  PLAN OF CARE: see H&P  I certify that inpatient services furnished can reasonably be expected to improve the patient's condition.   Pennelope Bracken, MD 06/08/2018, 12:42 PM

## 2018-06-08 NOTE — ED Notes (Signed)
Report called to [place], [facility], Pending [employer] transport.

## 2018-06-08 NOTE — BHH Group Notes (Signed)
LCSW Group Therapy Note 06/08/2018 11:45 AM  Type of Therapy and Topic: Group Therapy: Overcoming Obstacles  Participation Level: Active  Description of Group:  In this group patients will be encouraged to explore what they see as obstacles to their own wellness and recovery. They will be guided to discuss their thoughts, feelings, and behaviors related to these obstacles. The group will process together ways to cope with barriers, with attention given to specific choices patients can make. Each patient will be challenged to identify changes they are motivated to make in order to overcome their obstacles. This group will be process-oriented, with patients participating in exploration of their own experiences as well as giving and receiving support and challenge from other group members.  Therapeutic Goals: 1. Patient will identify personal and current obstacles as they relate to admission. 2. Patient will identify barriers that currently interfere with their wellness or overcoming obstacles.  3. Patient will identify feelings, thought process and behaviors related to these barriers. 4. Patient will identify two changes they are willing to make to overcome these obstacles:   Summary of Patient Progress  Dorothy [name] was engaged and participated throughout the group session. Dorothy [name] states that her main obstacle is "overcoming drinking alcohol and smoking cigarettes". Dorothy [name] reports that her barrier is that she personally does not want to stop, however she knows that it is "killing me". Dorothy [name] reports that she plans to engage in outpatient substance abuse services to overcome this obstacle.     Therapeutic Modalities:  Cognitive Behavioral Therapy Solution Focused Therapy Motivational Interviewing Relapse Prevention Therapy   Theresa Duty Clinical Social Worker

## 2018-06-08 NOTE — Progress Notes (Signed)
Nursing Progress Note: 7p-7a D: Pt currently presents with a anxious/thought blocking/pleasant affect and behavior. Pt states "I had a good day. I need help to sleep tonight though." Interacting appropriately with the milieu. Pt reports poor sleep during the previous night with current medication regimen. Pt did attend wrap-up group.  A: Pt provided with medications per providers orders. Pt's labs and vitals were monitored throughout the night. Pt supported emotionally and encouraged to express concerns and questions. Pt educated on medications.  R: Pt's safety ensured with 15 minute and environmental checks. Pt currently denies SI, HI, and AVH. Pt verbally contracts to seek staff if SI,HI, or AVH occurs and to consult with staff before acting on any harmful thoughts. Will continue to monitor.

## 2018-06-08 NOTE — BHH Group Notes (Signed)
Pt was invited but did not attend orientation/goals group. 

## 2018-06-09 DIAGNOSIS — G4700 Insomnia, unspecified: Secondary | ICD-10-CM

## 2018-06-09 DIAGNOSIS — F419 Anxiety disorder, unspecified: Secondary | ICD-10-CM

## 2018-06-09 NOTE — Progress Notes (Signed)
[facility] MD Progress Note  06/09/2018 1:33 PM Dorothy [name]  MRN:  [id number]  Subjective: Dorothy [name] reports, "I came here because of alcoholism. I'm doing well today. No withdrawal symptoms. But, I'm a little anxious because I could not smoke here or have my favorite magazine that I brought from my home. I have no depression. I'm not planning on going to a long term substance abuse treatments, rather, I will join the weekly AA meetings after discharge".  Dorothy [name] is a 62 y/o F with history of treatment for depression and alcohol use who was admitted from [place] on IVC initiated by her husband due to worsening symptoms of depression, SI without plan, and worsening use of alcohol. Pt had been evaluated in [facility] one day prior to this admission after she was sent there via EMS from her PCP appointment where she arrived intoxicated, but she left AMA from the waiting room. Pt was medically cleared, started on alcohol withdrawal protocol with ativan, and she was transferred to [facility] for additional treatment and evaluation. Upon initial interview, pt shares, "I was starting to drink all day. It was getting worse and worse." Pt reports that she has struggled with alcohol use for several years but in the past 1 year her drinking has significantly worsened, mostly associated with worsening depression and stressor of losing her job about 1 year ago and being unable to find a new job. Pt reports she is consuming about 1 bottle of wine and 1 pint of vodka daily, and she drinks from the moment she wakes up until she goes to sleep. She endorses depression symptoms of depressed mood, initial insomnia, anhedonia, guilty feelings, and psychomotor retardation. She denies SI, and states, "If I said anything like that, I was drunk."   Dorothy [name] is seen, chart reviewed. The chart findings discussed with the treatment team. She presents alert, oriented, cheerful & aware of situation. She is visible on the unit attending group  sessions. She denies any symptoms of depression or substance withdrawal symptoms. She complains of feeling a little anxious because she is not allowed to smoke here or have her favorite magazine from her home. She says she is not interested in going to a long term substance abuse treatment after discharge. She prefers joining the weekly AA meetings. She currently denies any SIHI, AVH, delusional thoughts or paranoia. She does not appear to be responding to any internal stimuli. Dorothy [name] has agreed to continue her current plan of care as already in progress.   Principal Problem: Severe recurrent major depression without psychotic features ([place])  Diagnosis:   Patient Active Problem List   Diagnosis Date Noted  . Severe recurrent major depression without psychotic features ([place]) [F33.2] 06/08/2018  . Alcohol use disorder, severe, dependence ([place]) [F10.20] 06/08/2018   Total Time spent with patient: 25 minutes  Past Psychiatric History: See H&P  Past Medical History:  Past Medical History:  Diagnosis Date  . Alcoholic ([place])   . Chronic bronchitis ([facility])    Still smoking  . Depression   . GERD (gastroesophageal reflux disease)   . Hoarseness 06/2010   Oral thrush---- Dr. Melida Quitter  . Hypertension   . Insomnia   . Malignant melanoma ([place]) 2009   Removed from left shoulder    Past Surgical History:  Procedure Laterality Date  . BASAL CELL CARCINOMA EXCISION  2012   Right side nose   . BREAST ENHANCEMENT SURGERY  2002  . ELBOW SURGERY     Right elbow surgery---Ruptured  tendon   Family History:  Family History  Problem Relation Age of Onset  . Cirrhosis Father    Family Psychiatric  History: See H&P  Social History:  Social History   Substance and Sexual Activity  Alcohol Use Yes   Comment: >1 pint vodka daily and wine     Social History   Substance and Sexual Activity  Drug Use Yes  . Types: Marijuana    Social History   Socioeconomic History  . Marital status:  Married    Spouse name: Not on file  . Number of children: Not on file  . Years of education: Not on file  . Highest education level: Not on file  Occupational History  . Not on file  Social Needs  . Financial resource strain: Not on file  . Food insecurity:    Worry: Not on file    Inability: Not on file  . Transportation needs:    Medical: Not on file    Non-medical: Not on file  Tobacco Use  . Smoking status: Current Every Day Smoker    Packs/day: 2.00    Types: Cigarettes  . Smokeless tobacco: Never Used  Substance and Sexual Activity  . Alcohol use: Yes    Comment: >1 pint vodka daily and wine  . Drug use: Yes    Types: Marijuana  . Sexual activity: Not on file  Lifestyle  . Physical activity:    Days per week: Not on file    Minutes per session: Not on file  . Stress: Not on file  Relationships  . Social connections:    Talks on phone: Not on file    Gets together: Not on file    Attends religious service: Not on file    Active member of club or organization: Not on file    Attends meetings of clubs or organizations: Not on file    Relationship status: Not on file  Other Topics Concern  . Not on file  Social History Narrative  . Not on file   Additional Social History:   Sleep: Good  Appetite:  Good  Current Medications: Current Facility-Administered Medications  Medication Dose Route Frequency Provider Last Rate Last Dose  . acetaminophen (TYLENOL) tablet 650 mg  650 mg Oral Q6H PRN Lindon Romp A, NP      . alum & mag hydroxide-simeth (MAALOX/MYLANTA) 200-200-20 MG/5ML suspension 30 mL  30 mL Oral Q4H PRN Lindon Romp A, NP      . ARIPiprazole (ABILIFY) tablet 5 mg  5 mg Oral Daily Pennelope Bracken, MD   5 mg at 06/09/18 0810  . FLUoxetine (PROZAC) capsule 40 mg  40 mg Oral Daily Pennelope Bracken, MD   40 mg at 06/09/18 0810  . hydrOXYzine (ATARAX/VISTARIL) tablet 50 mg  50 mg Oral Q6H PRN Pennelope Bracken, MD      . loperamide  (IMODIUM) capsule 2-4 mg  2-4 mg Oral PRN Lindon Romp A, NP      . LORazepam (ATIVAN) tablet 1 mg  1 mg Oral Q6H PRN Lindon Romp A, NP      . LORazepam (ATIVAN) tablet 1 mg  1 mg Oral TID Lindon Romp A, NP   1 mg at 06/09/18 0809   Followed by  . [START ON 06/10/2018] LORazepam (ATIVAN) tablet 1 mg  1 mg Oral BID Rozetta Nunnery, NP       Followed by  . [START ON 06/11/2018] LORazepam (ATIVAN) tablet 1 mg  1 mg Oral Daily Lindon Romp A, NP      . magnesium hydroxide (MILK OF MAGNESIA) suspension 30 mL  30 mL Oral Daily PRN Lindon Romp A, NP      . multivitamin with minerals tablet 1 tablet  1 tablet Oral Daily Lindon Romp A, NP   1 tablet at 06/09/18 0810  . nicotine (NICODERM CQ - dosed in mg/24 hours) patch 21 mg  21 mg Transdermal Daily Pennelope Bracken, MD   21 mg at 06/09/18 0809  . ondansetron (ZOFRAN-ODT) disintegrating tablet 4 mg  4 mg Oral Q6H PRN Lindon Romp A, NP      . pneumococcal 23 valent vaccine (PNU-IMMUNE) injection 0.5 mL  0.5 mL Intramuscular Tomorrow-1000 Lindon Romp A, NP      . thiamine (VITAMIN B-1) tablet 100 mg  100 mg Oral Daily Lindon Romp A, NP   100 mg at 06/09/18 0810  . traZODone (DESYREL) tablet 50 mg  50 mg Oral QHS PRN,MR X 1 [place], Randa Ngo, MD   50 mg at 06/08/18 2201   Lab Results:  Results for orders placed or performed during the hospital encounter of 06/07/18 (from the past 48 hour(s))  Ethanol     Status: None   Collection Time: 06/07/18  1:38 PM  Result Value Ref Range   Alcohol, Ethyl (B) <10 <10 mg/dL    Comment: (NOTE) Lowest detectable limit for serum alcohol is 10 mg/dL. For medical purposes only. Performed at [facility], [place] [address], [place], [place] [address]   Rapid urine drug screen (hospital performed)     Status: Abnormal   Collection Time: 06/07/18  1:38 PM  Result Value Ref Range   Opiates NONE DETECTED NONE DETECTED   Cocaine NONE DETECTED NONE DETECTED   Benzodiazepines POSITIVE  (A) NONE DETECTED   Amphetamines NONE DETECTED NONE DETECTED   Tetrahydrocannabinol POSITIVE (A) NONE DETECTED   Barbiturates NONE DETECTED NONE DETECTED    Comment: (NOTE) DRUG SCREEN FOR MEDICAL PURPOSES ONLY.  IF CONFIRMATION IS NEEDED FOR ANY PURPOSE, NOTIFY LAB WITHIN 5 DAYS. LOWEST DETECTABLE LIMITS FOR URINE DRUG SCREEN Drug Class                     Cutoff (ng/mL) Amphetamine and metabolites    1000 Barbiturate and metabolites    200 Benzodiazepine                 220 Tricyclics and metabolites     300 Opiates and metabolites        300 Cocaine and metabolites        300 THC                            50 Performed at [facility], [place] [address] [place], [place] [address]    Blood Alcohol level:  Lab Results  Component Value Date   ETH <10 02/14/7627   Metabolic Disorder Labs: No results found for: HGBA1C, MPG No results found for: PROLACTIN No results found for: CHOL, TRIG, HDL, CHOLHDL, VLDL, LDLCALC  Physical Findings: AIMS:  , ,  ,  ,    CIWA:  CIWA-Ar Total: 0 COWS:     Musculoskeletal: Strength & Muscle Tone: within normal limits Gait & Station: normal Patient leans: N/A  Psychiatric Specialty Exam: Physical Exam  Review of Systems  Respiratory: Negative.  Negative for cough and shortness of breath.  Cardiovascular: Negative.  Negative for chest pain and palpitations.  Gastrointestinal: Negative for abdominal pain, diarrhea, heartburn, nausea and vomiting.  Psychiatric/Behavioral: Positive for substance abuse (Hx. alcoholism). Negative for depression, hallucinations, memory loss and suicidal ideas. The patient is nervous/anxious. The patient does not have insomnia.     Blood pressure 111/84, pulse 92, temperature 98 F (36.7 C), temperature source Oral, resp. rate 18, height 5\' 6"  (1.676 m), weight 56.7 kg, SpO2 100 %.Body mass index is 20.18 kg/m.  General Appearance: Casual and Fairly Groomed  Eye Contact:  Good  Speech:   Clear and Coherent and Normal Rate  Volume:  Normal  Mood:  Anxious and Depressed  Affect:  Congruent, Constricted and Depressed  Thought Process:  Coherent and Goal Directed  Orientation:  Full (Time, Place, and Person)  Thought Content:  Logical  Suicidal Thoughts:  No  Homicidal Thoughts:  No  Memory:  Immediate;   Fair Recent;   Fair Remote;   Fair  Judgement:  Fair  Insight:  Fair  Psychomotor Activity:  Normal  Concentration:  Concentration: Fair  Recall:  [employer] of Knowledge:  Fair  Language:  Fair  Akathisia:  No  Handed:    AIMS (if indicated):     Assets:  Resilience Social Support  ADL's:  Intact  Cognition:  WNL  Sleep: 5.5     Treatment Plan Summary: Daily contact with patient to assess and evaluate symptoms and progress in treatment.  - Continue inpatient hospitalization.  - Will continue today 06/09/2018 plan as below except where it is noted.  Alcohol withdrawal.     - Continue the CIWA detox protocols as recommended.  Mood control.     - Continue Abilify 5 mg po daily.  Anxiety.     - continue Vistaril 50 mg po Q 6 hours prn.  Depression.     - Continue Prozac 20 mg po daily.  Insomnia.     - Continue Trazodone 50 mg po Q hs prn, may repeat x 1.  Patient to continue to attend & participate in the group counseling session.  Discharge disposition plan ongoing.  Lindell Spar, NP, PMHNP, FNP-BC. 06/09/2018, 1:33 PM

## 2018-06-09 NOTE — BHH Group Notes (Signed)
[facility] Group Therapy 06/09/2018 1:15pm  Type of Therapy: Mental Health Association Presentation  Participation Level: Invited. Chose to remain in bed.   Avelina Laine, LCSW 06/09/2018 10:02 AM

## 2018-06-09 NOTE — Plan of Care (Signed)
Patient was pleasant and anxious upon approach. Patient said she slept okay and denies SI HI AVH. Denies physical pain. Patient's only complaint is anxiety related to not being able to smoke cigarettes. No other problems voiced. Patient is compliant with medications prescribed per provider. No side effects noted.  Safety is maintained with 15 minute checks as well as environmental checks. Will continue to monitor.  Problem: Education: Goal: Emotional status will improve Outcome: Progressing   Problem: Education: Goal: Mental status will improve Outcome: Progressing   Problem: Education: Goal: Verbalization of understanding the information provided will improve Outcome: Progressing

## 2018-06-09 NOTE — Progress Notes (Signed)
Nursing Progress Note: 7p-7a D: Pt currently presents with a anxious/pleasant/thought/blocking affect and behavior. Pt states "I feel great. I've been sleeping better, and that has kept my mind off my serious cigarette addiction." Interacting appropriatly with the milieu. Pt reports good sleep during the previous night with current medication regimen. Pt did attend wrap-up group.  A: Pt provided with medications per providers orders. Pt's labs and vitals were monitored throughout the night. Pt supported emotionally and encouraged to express concerns and questions. Pt educated on medications.  R: Pt's safety ensured with 15 minute and environmental checks. Pt currently denies SI, HI, and AVH. Pt verbally contracts to seek staff if SI,HI, or AVH occurs and to consult with staff before acting on any harmful thoughts. Will continue to monitor.

## 2018-06-10 NOTE — Plan of Care (Signed)
Progress Note  D: pt found in the dayroom; compliant with medication administration. Pt states she slept well. Pt denied any physical symptoms or pain, rating her pain at a 0/10. Pt denies any si/hi/ah/vh and verbally agrees to approach staff if these become apparent or before harming herself while at [facility]. Pt denies any anxiety, but the pts blood pressure continues to be elevated.  A: pt provided support and encouragement. Pt given medications per protocol and standing orders. Q2m safety checks implemented and continued. R: pt safe on the unit. Will continue to monitor.   Pt progressing in the following metrics  Problem: Education: Goal: Knowledge of [place] [facility] Education information/materials will improve Outcome: Progressing Goal: Emotional status will improve Outcome: Progressing Goal: Mental status will improve Outcome: Progressing Goal: Verbalization of understanding the information provided will improve Outcome: Progressing   Problem: Activity: Goal: Interest or engagement in activities will improve Outcome: Progressing Goal: Sleeping patterns will improve Outcome: Progressing

## 2018-06-10 NOTE — Progress Notes (Signed)
Recreation Therapy Notes  Date: 10.18.19 Time: 0930 Location: [address] Dayroom  Group Topic: Stress Management  Goal Area(s) Addresses:  Patient will verbalize importance of using healthy stress management.  Patient will identify positive emotions associated with healthy stress management.   Intervention: Stress Management  Activity : Progressive Muscle Relaxation.  LRT introduced the stress management technique of progressive muscle relaxation.  LRT read a script to guide patients in tensing each muscle group individually then relaxing them. Patients were to follow along as the script was read.  Education:  Stress Management, Discharge Planning.   Education Outcome: Acknowledges edcuation/In group clarification offered/Needs additional education  Clinical Observations/Feedback: Pt did not attend group.    Victorino Sparrow, LRT/CTRS         Ria Comment, Lilac Hoff A 06/10/2018 11:12 AM

## 2018-06-10 NOTE — Progress Notes (Signed)
[facility] MD Progress Note  06/10/2018 1:11 PM SHERISSE FULLILOVE  MRN:  [id number] Subjective:    Sophia Sperry is a 62 y/o F with history of treatment for depression and alcohol use who was admitted from [place] on IVC initiated by her husband due to worsening symptoms of depression, SI without plan, and worsening use of alcohol. Pt had been evaluated in [facility] one day prior to this admission after she was sent there via EMS from her PCP appointment where she arrived intoxicated, but she left AMA from the waiting room. Pt was medically cleared, started on alcohol withdrawal protocol with ativan, and she was transferred to [facility] for additional treatment and evaluation. She was started on trial of abilify in addition to be restarted on home medication of prozac which was started about 2 weeks ago. Pt has been reporting incremental improvement of her presenting symptoms.  Today upon evaluation, pt shares, "I'm doing much better." She denies any specific concerns. She is sleeping well. Her appetite is good. She denies other physical complaints aside from some nausea which she associates with her medications. We discussed that starting on SSRI may cause some GI discomfort which should resolve in a few days, and pt was encouraged to take a snack with her medications. We also discussed about availability of zofran for PRN treatment of nausea. She denies SI/HI/AH/VH. She reports her mood has improved significantly during her stay.  She is tolerating her medications well, and she is in agreement to continue her current regimen without changes. Currently, her plan is to go to [place] meetings after discharge to help with maintaining sobriety, but she is willing to speak with SW team about treatment options which including residential treatment. Pt was in agreement with the above plan, and she had no further questions, comments, or concerns.   Principal Problem: Severe recurrent major depression without psychotic features  ([place]) Diagnosis:   Patient Active Problem List   Diagnosis Date Noted  . Severe recurrent major depression without psychotic features ([place]) [F33.2] 06/08/2018  . Alcohol use disorder, severe, dependence ([place]) [F10.20] 06/08/2018   Total Time spent with patient: 30 minutes  Past Psychiatric History: see H&P  Past Medical History:  Past Medical History:  Diagnosis Date  . Alcoholic ([place])   . Chronic bronchitis (H[facility])    Still smoking  . Depression   . GERD (gastroesophageal reflux disease)   . Hoarseness 06/2010   Oral thrush---- Dr. Melida Quitter  . Hypertension   . Insomnia   . Malignant melanoma ([place]) 2009   Removed from left shoulder    Past Surgical History:  Procedure Laterality Date  . BASAL CELL CARCINOMA EXCISION  2012   Right side nose   . BREAST ENHANCEMENT SURGERY  2002  . ELBOW SURGERY     Right elbow surgery---Ruptured tendon   Family History:  Family History  Problem Relation Age of Onset  . Cirrhosis Father    Family Psychiatric  History: see H&P Social History:  Social History   Substance and Sexual Activity  Alcohol Use Yes   Comment: >1 pint vodka daily and wine     Social History   Substance and Sexual Activity  Drug Use Yes  . Types: Marijuana    Social History   Socioeconomic History  . Marital status: Married    Spouse name: Not on file  . Number of children: Not on file  . Years of education: Not on file  . Highest education level: Not on file  Occupational History  . Not on file  Social Needs  . Financial resource strain: Not on file  . Food insecurity:    Worry: Not on file    Inability: Not on file  . Transportation needs:    Medical: Not on file    Non-medical: Not on file  Tobacco Use  . Smoking status: Current Every Day Smoker    Packs/day: 2.00    Types: Cigarettes  . Smokeless tobacco: Never Used  Substance and Sexual Activity  . Alcohol use: Yes    Comment: >1 pint vodka daily and wine  . Drug use: Yes     Types: Marijuana  . Sexual activity: Not on file  Lifestyle  . Physical activity:    Days per week: Not on file    Minutes per session: Not on file  . Stress: Not on file  Relationships  . Social connections:    Talks on phone: Not on file    Gets together: Not on file    Attends religious service: Not on file    Active member of club or organization: Not on file    Attends meetings of clubs or organizations: Not on file    Relationship status: Not on file  Other Topics Concern  . Not on file  Social History Narrative  . Not on file   Additional Social History:                         Sleep: Good  Appetite:  Good  Current Medications: Current Facility-Administered Medications  Medication Dose Route Frequency Provider Last Rate Last Dose  . acetaminophen (TYLENOL) tablet 650 mg  650 mg Oral Q6H PRN Lindon Romp A, NP      . alum & mag hydroxide-simeth (MAALOX/MYLANTA) 200-200-20 MG/5ML suspension 30 mL  30 mL Oral Q4H PRN Lindon Romp A, NP      . ARIPiprazole (ABILIFY) tablet 5 mg  5 mg Oral Daily Pennelope Bracken, MD   5 mg at 06/10/18 0759  . FLUoxetine (PROZAC) capsule 40 mg  40 mg Oral Daily Pennelope Bracken, MD   40 mg at 06/10/18 0759  . hydrOXYzine (ATARAX/VISTARIL) tablet 50 mg  50 mg Oral Q6H PRN Pennelope Bracken, MD   50 mg at 06/09/18 2107  . loperamide (IMODIUM) capsule 2-4 mg  2-4 mg Oral PRN Lindon Romp A, NP      . LORazepam (ATIVAN) tablet 1 mg  1 mg Oral Q6H PRN Lindon Romp A, NP      . LORazepam (ATIVAN) tablet 1 mg  1 mg Oral BID Lindon Romp A, NP   1 mg at 06/10/18 0759   Followed by  . [START ON 06/11/2018] LORazepam (ATIVAN) tablet 1 mg  1 mg Oral Daily Lindon Romp A, NP      . magnesium hydroxide (MILK OF MAGNESIA) suspension 30 mL  30 mL Oral Daily PRN Lindon Romp A, NP      . multivitamin with minerals tablet 1 tablet  1 tablet Oral Daily Lindon Romp A, NP   1 tablet at 06/10/18 0759  . nicotine (NICODERM CQ -  dosed in mg/24 hours) patch 21 mg  21 mg Transdermal Daily Pennelope Bracken, MD   21 mg at 06/10/18 0759  . ondansetron (ZOFRAN-ODT) disintegrating tablet 4 mg  4 mg Oral Q6H PRN Lindon Romp A, NP      . pneumococcal 23 valent vaccine (PNU-IMMUNE) injection 0.5 mL  0.5 mL Intramuscular Tomorrow-1000 Lindon Romp A, NP      . thiamine (VITAMIN B-1) tablet 100 mg  100 mg Oral Daily Lindon Romp A, NP   100 mg at 06/10/18 0759  . traZODone (DESYREL) tablet 50 mg  50 mg Oral QHS PRN,MR X 1 Pennelope Bracken, MD   50 mg at 06/09/18 2107    Lab Results: No results found for this or any previous visit (from the past 24 hour(s)).  Blood Alcohol level:  Lab Results  Component Value Date   ETH <10 25/12/3974    Metabolic Disorder Labs: No results found for: HGBA1C, MPG No results found for: PROLACTIN No results found for: CHOL, TRIG, HDL, CHOLHDL, VLDL, LDLCALC  Physical Findings: AIMS:  , ,  ,  ,    CIWA:  CIWA-Ar Total: 0 COWS:     Musculoskeletal: Strength & Muscle Tone: within normal limits Gait & Station: normal Patient leans: N/A  Psychiatric Specialty Exam: Physical Exam  Nursing note and vitals reviewed.   Review of Systems  Constitutional: Negative for chills and fever.  Respiratory: Negative for cough and shortness of breath.   Cardiovascular: Negative for chest pain.  Gastrointestinal: Negative for abdominal pain, heartburn, nausea and vomiting.  Psychiatric/Behavioral: Negative for depression, hallucinations and suicidal ideas. The patient is not nervous/anxious and does not have insomnia.     Blood pressure (!) 122/93, pulse 96, temperature 98.2 F (36.8 C), resp. rate 18, height 5\' 6"  (1.676 m), weight 56.7 kg, SpO2 100 %.Body mass index is 20.18 kg/m.  General Appearance: Casual and Fairly Groomed  Eye Contact:  Good  Speech:  Clear and Coherent and Normal Rate  Volume:  Normal  Mood:  Euthymic  Affect:  Appropriate, Congruent and Full Range   Thought Process:  Coherent and Goal Directed  Orientation:  Full (Time, Place, and Person)  Thought Content:  Logical  Suicidal Thoughts:  No  Homicidal Thoughts:  No  Memory:  Immediate;   Fair Recent;   Fair Remote;   Fair  Judgement:  Good  Insight:  Fair  Psychomotor Activity:  Normal  Concentration:  Concentration: Fair  Recall:  [employer] of Knowledge:  Fair  Language:  Fair  Akathisia:  No  Handed:    AIMS (if indicated):     Assets:  Resilience Social Support  ADL's:  Intact  Cognition:  WNL  Sleep:  Number of Hours: 6.5   Treatment Plan Summary: Daily contact with patient to assess and evaluate symptoms and progress in treatment and Medication management    - Continue inpatient hospitalization.  - Will continue today 06/10/2018 plan as below except where it is noted.  Alcohol withdrawal.     - Continue the CIWA detox protocols as recommended.  -MDD, recurrent, severe, without psychosis     - Continue Abilify 5 mg po daily.      - Continue Prozac 40 mg po daily.  Anxiety.     - continue Vistaril 50 mg po Q 6 hours prn.  Insomnia.     - Continue Trazodone 50 mg po Q hs prn, may repeat x 1.  Nausea     -Continue zofran ODT 4mg  po q6h prn nausea  Patient to continue to attend & participate in the group counseling session.  Discharge disposition plan ongoing.  Pennelope Bracken, MD 06/10/2018, 1:11 PM

## 2018-06-10 NOTE — Progress Notes (Signed)
Pt attended AA group this evening.  

## 2018-06-10 NOTE — BHH Group Notes (Signed)
LCSW Group Therapy Note  06/10/2018 1:15pm  Type of Therapy and Topic:  Group Therapy:  Feelings around Relapse and Recovery  Participation Level:  Did Not Attend--pt invited. Did not attend.    Description of Group:    Patients in this group will discuss emotions they experience before and after a relapse. They will process how experiencing these feelings, or avoidance of experiencing them, relates to having a relapse. Facilitator will guide patients to explore emotions they have related to recovery. Patients will be encouraged to process which emotions are more powerful. They will be guided to discuss the emotional reaction significant others in their lives may have to their relapse or recovery. Patients will be assisted in exploring ways to respond to the emotions of others without this contributing to a relapse.  Therapeutic Goals: 1. Patient will identify two or more emotions that lead to a relapse for them 2. Patient will identify two emotions that result when they relapse 3. Patient will identify two emotions related to recovery 4. Patient will demonstrate ability to communicate their needs through discussion and/or role plays   Summary of Patient Progress:  x   Therapeutic Modalities:   Cognitive Behavioral Therapy Solution-Focused Therapy Assertiveness Training Relapse Prevention Therapy   Avelina Laine, LCSW 06/10/2018 1:45 PM

## 2018-06-11 DIAGNOSIS — F17210 Nicotine dependence, cigarettes, uncomplicated: Secondary | ICD-10-CM

## 2018-06-11 NOTE — Progress Notes (Addendum)
[facility] MD Progress Note  06/11/2018 11:51 AM Dorothy [name]  MRN:  [id number]   Subjective: Patient reports today that she feels that she is doing much better.  She denies any withdrawal symptoms and reports that she has been sleeping well and has had a good appetite.  She denies any suicidal or homicidal ideations and denies any hallucinations as well.  She reports that her plan is to attend [facility] after she is finished her detox and she is open to be able to leave here on Monday to follow up there.  She also reports that she has a dentist appointment on Monday or Tuesday and would like to try to make that appointment as well.  Objective: Patient's chart and findings reviewed and discussed with treatment team.  Patient presents in the hallway and she appears groggy.  Patient states that she has been sleeping very well and has felt a little groggy each morning but feels that it is due to partially coming off of the alcohol as well as using the medication to help her sleep while she is here.  Patient is pleasant, calm, and cooperative.  She does have some minor tremors when she has her hands outstretched.  Otherwise patient seems to be tolerating medications well and improving.  Patient has been attending some groups and going to meals with the other patients.  Patient has been seen interacting with peers and staff appropriately.  Principal Problem: Severe recurrent major depression without psychotic features ([place]) Diagnosis:   Patient Active Problem List   Diagnosis Date Noted  . Severe recurrent major depression without psychotic features ([place]) [F33.2] 06/08/2018  . Alcohol use disorder, severe, dependence ([place]) [F10.20] 06/08/2018   Total Time spent with patient: 20 minutes  Past Psychiatric History: See H&P  Past Medical History:  Past Medical History:  Diagnosis Date  . Alcoholic ([place])   . Chronic bronchitis ([facility])    Still smoking  . Depression   . GERD (gastroesophageal reflux  disease)   . Hoarseness 06/2010   Oral thrush---- Dr. Melida Quitter  . Hypertension   . Insomnia   . Malignant melanoma ([place]) 2009   Removed from left shoulder    Past Surgical History:  Procedure Laterality Date  . BASAL CELL CARCINOMA EXCISION  2012   Right side nose   . BREAST ENHANCEMENT SURGERY  2002  . ELBOW SURGERY     Right elbow surgery---Ruptured tendon   Family History:  Family History  Problem Relation Age of Onset  . Cirrhosis Father    Family Psychiatric  History: See H&P Social History:  Social History   Substance and Sexual Activity  Alcohol Use Yes   Comment: >1 pint vodka daily and wine     Social History   Substance and Sexual Activity  Drug Use Yes  . Types: Marijuana    Social History   Socioeconomic History  . Marital status: Married    Spouse name: Not on file  . Number of children: Not on file  . Years of education: Not on file  . Highest education level: Not on file  Occupational History  . Not on file  Social Needs  . Financial resource strain: Not on file  . Food insecurity:    Worry: Not on file    Inability: Not on file  . Transportation needs:    Medical: Not on file    Non-medical: Not on file  Tobacco Use  . Smoking status: Current Every Day Smoker  Packs/day: 2.00    Types: Cigarettes  . Smokeless tobacco: Never Used  Substance and Sexual Activity  . Alcohol use: Yes    Comment: >1 pint vodka daily and wine  . Drug use: Yes    Types: Marijuana  . Sexual activity: Not on file  Lifestyle  . Physical activity:    Days per week: Not on file    Minutes per session: Not on file  . Stress: Not on file  Relationships  . Social connections:    Talks on phone: Not on file    Gets together: Not on file    Attends religious service: Not on file    Active member of club or organization: Not on file    Attends meetings of clubs or organizations: Not on file    Relationship status: Not on file  Other Topics Concern  .  Not on file  Social History Narrative  . Not on file   Additional Social History:                         Sleep: Good  Appetite:  Good  Current Medications: Current Facility-Administered Medications  Medication Dose Route Frequency Provider Last Rate Last Dose  . acetaminophen (TYLENOL) tablet 650 mg  650 mg Oral Q6H PRN Lindon Romp A, NP      . alum & mag hydroxide-simeth (MAALOX/MYLANTA) 200-200-20 MG/5ML suspension 30 mL  30 mL Oral Q4H PRN Lindon Romp A, NP      . ARIPiprazole (ABILIFY) tablet 5 mg  5 mg Oral Daily Pennelope Bracken, MD   5 mg at 06/11/18 0848  . FLUoxetine (PROZAC) capsule 40 mg  40 mg Oral Daily Pennelope Bracken, MD   40 mg at 06/11/18 0848  . hydrOXYzine (ATARAX/VISTARIL) tablet 50 mg  50 mg Oral Q6H PRN Pennelope Bracken, MD   50 mg at 06/10/18 2140  . magnesium hydroxide (MILK OF MAGNESIA) suspension 30 mL  30 mL Oral Daily PRN Lindon Romp A, NP      . multivitamin with minerals tablet 1 tablet  1 tablet Oral Daily Lindon Romp A, NP   1 tablet at 06/11/18 0848  . nicotine (NICODERM CQ - dosed in mg/24 hours) patch 21 mg  21 mg Transdermal Daily Pennelope Bracken, MD   21 mg at 06/11/18 0848  . pneumococcal 23 valent vaccine (PNU-IMMUNE) injection 0.5 mL  0.5 mL Intramuscular Tomorrow-1000 Lindon Romp A, NP      . thiamine (VITAMIN B-1) tablet 100 mg  100 mg Oral Daily Lindon Romp A, NP   100 mg at 06/11/18 0848  . traZODone (DESYREL) tablet 50 mg  50 mg Oral QHS PRN,MR X 1 [place], Randa Ngo, MD   50 mg at 06/10/18 2140    Lab Results: No results found for this or any previous visit (from the past 48 hour(s)).  Blood Alcohol level:  Lab Results  Component Value Date   ETH <10 09/32/3557    Metabolic Disorder Labs: No results found for: HGBA1C, MPG No results found for: PROLACTIN No results found for: CHOL, TRIG, HDL, CHOLHDL, VLDL, LDLCALC  Physical Findings: AIMS: Facial and Oral Movements Muscles  of Facial Expression: None, normal Lips and Perioral Area: None, normal Jaw: None, normal Tongue: None, normal,Extremity Movements Upper (arms, wrists, hands, fingers): None, normal Lower (legs, knees, ankles, toes): None, normal, Trunk Movements Neck, shoulders, hips: None, normal, Overall Severity Severity of abnormal movements (highest score  from questions above): None, normal Incapacitation due to abnormal movements: None, normal Patient's awareness of abnormal movements (rate only patient's report): No Awareness, Dental Status Current problems with teeth and/or dentures?: No Does patient usually wear dentures?: No  CIWA:  CIWA-Ar Total: 3 COWS:  COWS Total Score: 1  Musculoskeletal: Strength & Muscle Tone: within normal limits Gait & Station: normal Patient leans: N/A  Psychiatric Specialty Exam: Physical Exam  Nursing note and vitals reviewed. Constitutional: She is oriented to person, place, and time. She appears well-developed and well-nourished.  Cardiovascular: Normal rate.  Respiratory: Effort normal.  Musculoskeletal: Normal range of motion.  Neurological: She is alert and oriented to person, place, and time.  Skin: Skin is warm.    Review of Systems  Constitutional: Negative.   HENT: Negative.   Eyes: Negative.   Respiratory: Negative.   Cardiovascular: Negative.   Gastrointestinal: Negative.   Genitourinary: Negative.   Musculoskeletal: Negative.   Skin: Negative.   Neurological: Negative.   Endo/Heme/Allergies: Negative.   Psychiatric/Behavioral: Positive for depression and substance abuse. The patient is nervous/anxious.     Blood pressure (!) 148/96, pulse 86, temperature 98 F (36.7 C), temperature source Oral, resp. rate 18, height 5\' 6"  (1.676 m), weight 56.7 kg, SpO2 100 %.Body mass index is 20.18 kg/m.  General Appearance: Disheveled  Eye Contact:  Good  Speech:  Clear and Coherent and Normal Rate  Volume:  Normal  Mood:  Depressed  Affect:   Congruent  Thought Process:  Goal Directed and Descriptions of Associations: Intact  Orientation:  Full (Time, Place, and Person)  Thought Content:  WDL  Suicidal Thoughts:  No  Homicidal Thoughts:  No  Memory:  Immediate;   Good Recent;   Good Remote;   Good  Judgement:  Fair  Insight:  Fair  Psychomotor Activity:  Normal  Concentration:  Concentration: Good and Attention Span: Good  Recall:  Good  Fund of Knowledge:  Good  Language:  Good  Akathisia:  No  Handed:  Right  AIMS (if indicated):     Assets:  Communication Skills Desire for Improvement Financial Resources/Insurance Housing Physical Health Social Support Transportation  ADL's:  Intact  Cognition:  WNL  Sleep:  Number of Hours: 6.25   Problems addressed MDD severe recurrent Alcohol use disorder  Treatment Plan Summary: Daily contact with patient to assess and evaluate symptoms and progress in treatment, Medication management and Plan is to: Continue Abilify 5 mg p.o. daily for mood stability Continue Prozac 40 mg p.o. daily for mood stability Continue Ativan detox protocol Continue trazodone 50 mg p.o. nightly as needed for insomnia Continue Vistaril 50 mg p.o. every 6 hours as needed for anxiety Encourage group therapy participation Potential discharge to [facility] on Monday  Lowry Ram Money, FNP 06/11/2018, 11:51 AM   ..Agree with NP Progress Note

## 2018-06-11 NOTE — Progress Notes (Signed)
Pt did not attend AA group this evening.  

## 2018-06-11 NOTE — B[facility] Group Notes (Signed)
[place] Group Notes: (Clinical Social Work)   06/11/2018      Type of Therapy:  Group Therapy   Participation Level:  Did Not Attend despite MHT prompting   Selmer Dominion, LCSW 06/11/2018, 2:57 PM

## 2018-06-11 NOTE — BHH Group Notes (Signed)
[place] Group Notes:  (Nursing)  Date:  06/11/2018  Time: 1:15 PM Type of Therapy:  Nurse Education  Participation Level:  Active  Participation Quality:  Attentive  Affect:  Appropriate  Cognitive:  Appropriate  Insight:  Appropriate  Engagement in Group:  Engaged  Modes of Intervention:  Discussion and Education  Summary of Progress/Problems: Nurse led group-  Life Skills  Waymond Cera 06/11/2018, 7:05 PM

## 2018-06-11 NOTE — Plan of Care (Signed)
D: Patient presents depressed, guarded, drowsy. She denies withdrawal symptoms. She slept well last night, and received medication that was helpful for sleep. Her appetite is good, energy low, and concentration good. She rates her depression and anxiety 4/10. She rates her feeling of hopelessness 0/10. BP running high at 142/103, provider T. Money notified. Will monitor closely for increasing BP or signs of withdrawal. Patient denies SI/HI/AVH.  A: Patient checked q15 min, and checks reviewed. Reviewed medication changes with patient and educated on side effects. Educated patient on importance of attending group therapy sessions and educated on several coping skills. Encouarged participation in milieu through recreation therapy and attending meals with peers. Support and encouragement provided. Fluids offered. R: Patient receptive to education on medications, and is medication compliant. Patient contracts for safety on the unit. Goal: "To get better" and "work on my next plan for discharge."

## 2018-06-12 MED ORDER — TRAZODONE HCL 100 MG PO TABS
100.0000 mg | ORAL_TABLET | Freq: Every evening | ORAL | Status: DC | PRN
  Administered 2018-06-12: 100 mg via ORAL
  Filled 2018-06-12: qty 7
  Filled 2018-06-12: qty 1

## 2018-06-12 NOTE — B[facility] Group Notes (Signed)
[place] Group Notes:  (Nursing)  Date:  06/12/2018  Time: 1:15 PM Type of Therapy:  Nurse Education  Participation Level:  Did Not Attend   Dorothy [name] 06/12/2018, 5:39 PM

## 2018-06-12 NOTE — Plan of Care (Signed)
D: Patient presents anxious with mild tremor. She denies withdrawal symptoms, but has a visible tremor and appears anxious. BP is trending down towards WNL. She slept poorly last night, and received medication but it was not helpful. Her appetite is good, energy normal, and concentration good. She rates her depression and feeling of hopelessness 0/10 and anxiety 2/10. Patient denies SI/HI/AVH.  A: Patient checked q15 min, and checks reviewed. Reviewed medication changes with patient and educated on side effects. Educated patient on importance of attending group therapy sessions and educated on several coping skills. Encouarged participation in milieu through recreation therapy and attending meals with peers. Support and encouragement provided. Fluids offered. R: Patient receptive to education on medications, and is medication compliant. Patient contracts for safety on the unit. Goal: "Continue to not use alcohol or cigarettes" and "I wish you didn't have to open the door at night every 5 minutes."

## 2018-06-12 NOTE — Progress Notes (Signed)
Patient did attend the evening speaker AA meeting.  

## 2018-06-12 NOTE — [facility] Group Notes (Signed)
[facility] LCSW Group Therapy Note  06/12/2018  10:00-11:00AM  Type of Therapy and Topic:  Group Therapy:  Adding Supports Including Being Your Own Support  Participation Level:  Did Not Attend   Description of Group:  Patients in this group were introduced to the concept that additional supports including self-support are an essential part of recovery.  A song entitled "I Need Help!" was played and a group discussion was held in reaction to the idea of needing to add supports.  A song entitled "My Own Hero" was played and a group discussion ensued in which patients stated they could relate to the song and it inspired them to realize they have be willing to help themselves in order to succeed, because other people cannot achieve sobriety or stability for them.  We discussed adding a variety of healthy supports to address the various needs in their lives.  A song was played called "I Know Where I've Been" toward the end of group and used to conduct an inspirational wrap-up to group of remembering how far they have already come in their journey.  Therapeutic Goals: 1)  demonstrate the importance of being a part of one's own support system 2)  discuss reasons people in one's life may eventually be unable to be continually supportive  3)  identify the patient's current support system and   4)  elicit commitments to add healthy supports and to become more conscious of being self-supportive   Summary of Patient Progress:  N/A   Therapeutic Modalities:   Motivational Interviewing Activity  Maretta Los

## 2018-06-12 NOTE — Progress Notes (Addendum)
[facility] MD Progress Note  06/12/2018 1:04 PM Dorothy [name]  MRN:  [id number]   Subjective: Patient states that she has continued to improve.  She stated that she did have a little bit of difficulty with sleep last night, but she is slept well every other night she has been here.  She denies any withdrawal symptoms today and denies any medication side effects.  She denies any suicidal or homicidal ideations and denies any hallucinations.  Patient states that she is ready to be discharged so that she can go to [facility] and she is ready to go through treatment so she can go back home.  Patient states she is hoping to be discharged tomorrow so that she can start treatment.  Objective: Patient's chart and findings reviewed and discussed with treatment team.  Patient presents in the milieu and has been seen interacting with peers and staff appropriately.  Patient is pleasant, calm, and cooperative.  Patient has been compliant with treatment and medications.  Patient has progressed well enough that she should be discharged tomorrow so that she can attend the IOP program to [facility].  Principal Problem: Severe recurrent major depression without psychotic features ([place]) Diagnosis:   Patient Active Problem List   Diagnosis Date Noted  . Severe recurrent major depression without psychotic features ([place]) [F33.2] 06/08/2018  . Alcohol use disorder, severe, dependence ([place]) [F10.20] 06/08/2018   Total Time spent with patient: 30 minutes  Past Psychiatric History: See H&P  Past Medical History:  Past Medical History:  Diagnosis Date  . Alcoholic ([place])   . Chronic bronchitis (HCC)    Still smoking  . Depression   . GERD (gastroesophageal reflux disease)   . Hoarseness 06/2010   Oral thrush---- Dr. Melida Quitter  . Hypertension   . Insomnia   . Malignant melanoma ([place]) 2009   Removed from left shoulder    Past Surgical History:  Procedure Laterality Date  . BASAL CELL CARCINOMA EXCISION  2012   Right  side nose   . BREAST ENHANCEMENT SURGERY  2002  . ELBOW SURGERY     Right elbow surgery---Ruptured tendon   Family History:  Family History  Problem Relation Age of Onset  . Cirrhosis Father    Family Psychiatric  History: See H&P Social History:  Social History   Substance and Sexual Activity  Alcohol Use Yes   Comment: >1 pint vodka daily and wine     Social History   Substance and Sexual Activity  Drug Use Yes  . Types: Marijuana    Social History   Socioeconomic History  . Marital status: Married    Spouse name: Not on file  . Number of children: Not on file  . Years of education: Not on file  . Highest education level: Not on file  Occupational History  . Not on file  Social Needs  . Financial resource strain: Not on file  . Food insecurity:    Worry: Not on file    Inability: Not on file  . Transportation needs:    Medical: Not on file    Non-medical: Not on file  Tobacco Use  . Smoking status: Current Every Day Smoker    Packs/day: 2.00    Types: Cigarettes  . Smokeless tobacco: Never Used  Substance and Sexual Activity  . Alcohol use: Yes    Comment: >1 pint vodka daily and wine  . Drug use: Yes    Types: Marijuana  . Sexual activity: Not on file  Lifestyle  . Physical activity:    Days per week: Not on file    Minutes per session: Not on file  . Stress: Not on file  Relationships  . Social connections:    Talks on phone: Not on file    Gets together: Not on file    Attends religious service: Not on file    Active member of club or organization: Not on file    Attends meetings of clubs or organizations: Not on file    Relationship status: Not on file  Other Topics Concern  . Not on file  Social History Narrative  . Not on file   Additional Social History:                         Sleep: Fair  Appetite:  Good  Current Medications: Current Facility-Administered Medications  Medication Dose Route Frequency Provider Last  Rate Last Dose  . acetaminophen (TYLENOL) tablet 650 mg  650 mg Oral Q6H PRN Lindon Romp A, NP      . alum & mag hydroxide-simeth (MAALOX/MYLANTA) 200-200-20 MG/5ML suspension 30 mL  30 mL Oral Q4H PRN Lindon Romp A, NP      . ARIPiprazole (ABILIFY) tablet 5 mg  5 mg Oral Daily Pennelope Bracken, MD   5 mg at 06/12/18 0758  . FLUoxetine (PROZAC) capsule 40 mg  40 mg Oral Daily Pennelope Bracken, MD   40 mg at 06/12/18 0758  . hydrOXYzine (ATARAX/VISTARIL) tablet 50 mg  50 mg Oral Q6H PRN Pennelope Bracken, MD   50 mg at 06/11/18 2108  . magnesium hydroxide (MILK OF MAGNESIA) suspension 30 mL  30 mL Oral Daily PRN Lindon Romp A, NP      . multivitamin with minerals tablet 1 tablet  1 tablet Oral Daily Lindon Romp A, NP   1 tablet at 06/12/18 0758  . nicotine (NICODERM CQ - dosed in mg/24 hours) patch 21 mg  21 mg Transdermal Daily Pennelope Bracken, MD   21 mg at 06/12/18 0758  . pneumococcal 23 valent vaccine (PNU-IMMUNE) injection 0.5 mL  0.5 mL Intramuscular Tomorrow-1000 Lindon Romp A, NP      . thiamine (VITAMIN B-1) tablet 100 mg  100 mg Oral Daily Lindon Romp A, NP   100 mg at 06/12/18 0758  . traZODone (DESYREL) tablet 100 mg  100 mg Oral QHS PRN Money, Lowry Ram, FNP        Lab Results: No results found for this or any previous visit (from the past 48 hour(s)).  Blood Alcohol level:  Lab Results  Component Value Date   ETH <10 18/29/9371    Metabolic Disorder Labs: No results found for: HGBA1C, MPG No results found for: PROLACTIN No results found for: CHOL, TRIG, HDL, CHOLHDL, VLDL, LDLCALC  Physical Findings: AIMS: Facial and Oral Movements Muscles of Facial Expression: None, normal Lips and Perioral Area: None, normal Jaw: None, normal Tongue: None, normal,Extremity Movements Upper (arms, wrists, hands, fingers): Minimal(mild tremors) Lower (legs, knees, ankles, toes): None, normal, Trunk Movements Neck, shoulders, hips: None, normal,  Overall Severity Severity of abnormal movements (highest score from questions above): None, normal Incapacitation due to abnormal movements: None, normal Patient's awareness of abnormal movements (rate only patient's report): No Awareness, Dental Status Current problems with teeth and/or dentures?: No Does patient usually wear dentures?: No  CIWA:  CIWA-Ar Total: 4 COWS:  COWS Total Score: 2  Musculoskeletal: Strength & Muscle Tone:  within normal limits Gait & Station: normal Patient leans: N/A  Psychiatric Specialty Exam: Physical Exam  Nursing note and vitals reviewed. Constitutional: She is oriented to person, place, and time. She appears well-developed and well-nourished.  Cardiovascular: Normal rate.  Respiratory: Effort normal.  Musculoskeletal: Normal range of motion.  Neurological: She is alert and oriented to person, place, and time.  Skin: Skin is warm.    Review of Systems  Constitutional: Negative.   HENT: Negative.   Eyes: Negative.   Respiratory: Negative.   Cardiovascular: Negative.   Gastrointestinal: Negative.   Genitourinary: Negative.   Musculoskeletal: Negative.   Skin: Negative.   Neurological: Negative.   Endo/Heme/Allergies: Negative.   Psychiatric/Behavioral: The patient is nervous/anxious.     Blood pressure 123/75, pulse 76, temperature 97.9 F (36.6 C), temperature source Oral, resp. rate 16, height 5\' 6"  (1.676 m), weight 56.7 kg, SpO2 100 %.Body mass index is 20.18 kg/m.  General Appearance: Casual  Eye Contact:  Good  Speech:  Clear and Coherent and Normal Rate  Volume:  Normal  Mood:  Euthymic  Affect:  Congruent  Thought Process:  Goal Directed and Descriptions of Associations: Intact  Orientation:  Full (Time, Place, and Person)  Thought Content:  WDL  Suicidal Thoughts:  No  Homicidal Thoughts:  No  Memory:  Immediate;   Good Recent;   Good Remote;   Good  Judgement:  Fair  Insight:  Fair  Psychomotor Activity:  Normal   Concentration:  Concentration: Good and Attention Span: Good  Recall:  Good  Fund of Knowledge:  Good  Language:  Good  Akathisia:  No  Handed:  Right  AIMS (if indicated):     Assets:  Communication Skills Desire for Improvement Financial Resources/Insurance Housing Physical Health Social Support Transportation  ADL's:  Intact  Cognition:  WNL  Sleep:  Number of Hours: 6.25   Problems addressed MDD severe recurrent Alcohol use disorder  Treatment Plan Summary: Daily contact with patient to assess and evaluate symptoms and progress in treatment, Medication management and Plan is to: Continue Abilify 5 mg p.o. daily for mood stability Continue Prozac 40 mg p.o. daily for mood stability Continue Ativan detox protocol Increased trazodone 100 mg p.o. nightly as needed for insomnia Continue Vistaril 50 mg p.o. every 6 hours as needed for anxiety Encourage group therapy participation Discharge to [facility] on Monday 06/13/18  Lewis Shock, FNP 06/12/2018, 1:04 PM   ..Agree with NP Progress Note

## 2018-06-13 MED ORDER — TRAZODONE HCL 100 MG PO TABS: 100.0000 mg | ORAL_TABLET | Freq: Every evening | ORAL | 0 refills | Status: DC | PRN

## 2018-06-13 MED ORDER — HYDROXYZINE HCL 50 MG PO TABS: 50.0000 mg | ORAL_TABLET | Freq: Four times a day (QID) | ORAL | 0 refills | Status: DC | PRN

## 2018-06-13 MED ORDER — FLUOXETINE HCL 40 MG PO CAPS: 40.0000 mg | ORAL_CAPSULE | Freq: Every day | ORAL | 0 refills | Status: DC

## 2018-06-13 MED ORDER — ARIPIPRAZOLE 5 MG PO TABS: 5.0000 mg | ORAL_TABLET | Freq: Every day | ORAL | 0 refills | Status: DC

## 2018-06-13 NOTE — Plan of Care (Signed)
D: Pt A & O X 3. Denies SI, HI, AVH and pain at this time. Her sleep was fair last night, and she received medication that was "somewhat" helpful. Her appetite is good, energy low and concentration good. She rates her depression and hopelessness 0/10 and anxiety 2/10. She denies withdrawal symptoms or physical complaints. Goal at discharge is "staying sober" and "stay positive." D/C home as ordered. Patient picked up by husband. A: D/C instructions reviewed with pt including prescriptions, medication samples, and follow up appointment, compliance encouraged. All belongings from locker #23 given to pt at time of departure. Scheduled and PRN medications given with verbal education and effects monitored. Safety checks maintained without incident till time of d/c.  R: Pt receptive to care. Compliant with medications when offered. Denies adverse drug reactions when assessed. Verbalized understanding related to d/c instructions. Signed belonging sheet in agreement with items received from locker. Ambulatory with a steady gait. Appears to be in no physical distress at time of departure.

## 2018-06-13 NOTE — Progress Notes (Signed)
  [facility] Adult Case Management Discharge Plan :  Will you be returning to the same living situation after discharge:  Yes,  home At discharge, do you have transportation home?: Yes,  husband Do you have the ability to pay for your medications: Yes,  mental health  Release of information consent forms completed and submitted to medical records by CSW.  Patient to Follow up at: Follow-up Information    [facility].   Why:  Walk in within 3 days of hospital discharge to be assessed for Substance Abuse Intensive Outpatient Program/therapy. Walk in hours: Mondays, Wednesdays, and Fridays from 11:00AM-12:30PM. Thank you.  Contact information: [place], [place] [address] Phone: [phone] Fax: [phone]       Josetta Huddle, MD Follow up on 06/13/2018.   Specialty:  Internal Medicine Why:  Hospital follow-up with Dr. Laurann Montana on Monday, 11/4 at 11:00AM for medication management. (Dr. Inda Merlin is out of the office). Thank you.  Contact information: [address] [place] [address] [phone]           Next level of care provider has access to [place] and Suicide Prevention discussed: Yes,  SPE completed with pt and her husband. SPI pamphlet and Mobile Crisis information provided to pt.      Has patient been referred to the Quitline?: Patient refused referral  Patient has been referred for addiction treatment: Yes  Avelina Laine, LCSW 06/13/2018, 8:53 AM

## 2018-06-13 NOTE — [facility] Suicide Risk Assessment (Signed)
[facility] Discharge Suicide Risk Assessment   Principal Problem: Severe recurrent major depression without psychotic features [facility]) Discharge Diagnoses:  Patient Active Problem List   Diagnosis Date Noted  . Severe recurrent major depression without psychotic features ([place]) [F33.2] 06/08/2018  . Alcohol use disorder, severe, dependence ([place]) [F10.20] 06/08/2018    Total Time spent with patient: 30 minutes  Psychiatric Specialty Exam: Review of Systems  Constitutional: Negative for chills and fever.  Respiratory: Negative for cough and shortness of breath.   Cardiovascular: Negative for chest pain.  Gastrointestinal: Negative for abdominal pain, heartburn, nausea and vomiting.  Psychiatric/Behavioral: Negative for depression, hallucinations and suicidal ideas. The patient is not nervous/anxious and does not have insomnia.     Blood pressure 116/87, pulse 79, temperature 98.1 F (36.7 C), resp. rate 18, height 5\' 6"  (1.676 m), weight 56.7 kg, SpO2 100 %.Body mass index is 20.18 kg/m.   Mental Status Per Nursing Assessment::   On Admission:  NA  Demographic Factors:  Caucasian and Unemployed  Loss Factors: Financial problems/change in socioeconomic status  Historical Factors: Impulsivity  Risk Reduction Factors:   Sense of responsibility to family, Living with another person, especially a relative, Positive social support, Positive therapeutic relationship and Positive coping skills or problem solving skills  Continued Clinical Symptoms:  Depression:   Comorbid alcohol abuse/dependence Impulsivity Alcohol/Substance Abuse/Dependencies  Cognitive Features That Contribute To Risk:  None    Suicide Risk:  Minimal: No identifiable suicidal ideation.  Patients presenting with no risk factors but with morbid ruminations; may be classified as minimal risk based on the severity of the depressive symptoms  Follow-up Information    [facility].   Why:  Walk  in within 3 days of hospital discharge to be assessed for Substance Abuse Intensive Outpatient Program/therapy. Walk in hours: Mondays, Wednesdays, and Fridays from 11:00AM-12:30PM. Thank you.  Contact information: [place], [place] [address] Phone: [phone] Fax: [phone]       Josetta Huddle, MD Follow up on 06/13/2018.   Specialty:  Internal Medicine Why:  Hospital follow-up with Dr. Laurann Montana on Monday, 11/4 at 11:00AM for medication management. (Dr. Inda Merlin is out of the office). Thank you.  Contact information: [address] [place] [address] [phone]           Plan Of Care/Follow-up recommendations:  Activity:  as tolerated Diet:  normal Tests:  NA Other:  see above for [place], MD 06/13/2018, 9:03 AM

## 2018-06-13 NOTE — Progress Notes (Signed)
Recreation Therapy Notes  Date: 10.21.19 Time: 0930 Location: [address] Dayroom  Group Topic: Stress Management  Goal Area(s) Addresses:  Patient will verbalize importance of using healthy stress management.  Patient will identify positive emotions associated with healthy stress management.   Behavioral Response: Engaged  Intervention: Stress Management  Activity : Meditation.  LRT introduced the stress management technique of meditation.  LRT played a meditation that dealt with impermanence.  Patients were to follow along as the meditation played to engage in the meditation.  Education:  Stress Management, Discharge Planning.   Education Outcome: Acknowledges edcuation/In group clarification offered/Needs additional education  Clinical Observations/Feedback: Pt attended and participated in group.    Victorino Sparrow, LRT/CTRS         Ria Comment, Korina Tretter A 06/13/2018 11:03 AM

## 2018-06-13 NOTE — Tx Team (Signed)
Interdisciplinary Treatment and Diagnostic Plan Update  06/13/2018 Time of Session: 0830AM Dorothy [name] MRN: [id number]  Principal Diagnosis: MDD  Secondary Diagnoses: Principal Problem:   Severe recurrent major depression without psychotic features [facility]) Active Problems:   Alcohol use disorder, severe, dependence ([place])   Current Medications:  Current Facility-Administered Medications  Medication Dose Route Frequency Provider Last Rate Last Dose  . acetaminophen (TYLENOL) tablet 650 mg  650 mg Oral Q6H PRN Lindon Romp A, NP      . alum & mag hydroxide-simeth (MAALOX/MYLANTA) 200-200-20 MG/5ML suspension 30 mL  30 mL Oral Q4H PRN Lindon Romp A, NP      . ARIPiprazole (ABILIFY) tablet 5 mg  5 mg Oral Daily Pennelope Bracken, MD   5 mg at 06/13/18 0757  . FLUoxetine (PROZAC) capsule 40 mg  40 mg Oral Daily Pennelope Bracken, MD   40 mg at 06/13/18 0757  . hydrOXYzine (ATARAX/VISTARIL) tablet 50 mg  50 mg Oral Q6H PRN Pennelope Bracken, MD   50 mg at 06/12/18 2230  . magnesium hydroxide (MILK OF MAGNESIA) suspension 30 mL  30 mL Oral Daily PRN Lindon Romp A, NP      . multivitamin with minerals tablet 1 tablet  1 tablet Oral Daily Lindon Romp A, NP   1 tablet at 06/13/18 0757  . nicotine (NICODERM CQ - dosed in mg/24 hours) patch 21 mg  21 mg Transdermal Daily Pennelope Bracken, MD   21 mg at 06/13/18 0756  . pneumococcal 23 valent vaccine (PNU-IMMUNE) injection 0.5 mL  0.5 mL Intramuscular Tomorrow-1000 Lindon Romp A, NP      . thiamine (VITAMIN B-1) tablet 100 mg  100 mg Oral Daily Lindon Romp A, NP   100 mg at 06/13/18 0757  . traZODone (DESYREL) tablet 100 mg  100 mg Oral QHS PRN Money, Lowry Ram, FNP   100 mg at 06/12/18 2230   PTA Medications: Medications Prior to Admission  Medication Sig Dispense Refill Last Dose  . clonazePAM (KLONOPIN) 1 MG tablet Take 1 mg by mouth daily.  0 06/07/2018 at Unknown time  . FLUoxetine (PROZAC) 20 MG capsule  Take 40 mg by mouth every morning.  3 06/07/2018 at Unknown time    Patient Stressors:    Patient Strengths:    Treatment Modalities: Medication Management, Group therapy, Case management,  1 to 1 session with clinician, Psychoeducation, Recreational therapy.   Physician Treatment Plan for Primary Diagnosis: MDD  Medication Management: Evaluate patient's response, side effects, and tolerance of medication regimen.  Therapeutic Interventions: 1 to 1 sessions, Unit Group sessions and Medication administration.  Evaluation of Outcomes: Adequate for discharge  Physician Treatment Plan for Secondary Diagnosis: Principal Problem:   Severe recurrent major depression without psychotic features ([place]) Active Problems:   Alcohol use disorder, severe, dependence ([place])   Medication Management: Evaluate patient's response, side effects, and tolerance of medication regimen.  Therapeutic Interventions: 1 to 1 sessions, Unit Group sessions and Medication administration.  Evaluation of Outcomes: Adequate for discharge   RN Treatment Plan for Primary Diagnosis: MDD Long Term Goal(s): Knowledge of disease and therapeutic regimen to maintain health will improve  Short Term Goals: Ability to remain free from injury will improve, Ability to demonstrate self-control, Ability to participate in decision making will improve and Ability to disclose and discuss suicidal ideas  Medication Management: RN will administer medications as ordered by provider, will assess and evaluate patient's response and provide education to patient for prescribed medication.  RN will report any adverse and/or side effects to prescribing provider.  Therapeutic Interventions: 1 on 1 counseling sessions, Psychoeducation, Medication administration, Evaluate responses to treatment, Monitor vital signs and CBGs as ordered, Perform/monitor CIWA, COWS, AIMS and Fall Risk screenings as ordered, Perform wound care treatments as  ordered.  Evaluation of Outcomes: Adequate for discharge   LCSW Treatment Plan for Primary Diagnosis: MDD Long Term Goal(s): Safe transition to appropriate next level of care at discharge, Engage patient in therapeutic group addressing interpersonal concerns.  Short Term Goals: Engage patient in aftercare planning with referrals and resources, Increase emotional regulation, Facilitate patient progression through stages of change regarding substance use diagnoses and concerns and Identify triggers associated with mental health/substance abuse issues  Therapeutic Interventions: Assess for all discharge needs, 1 to 1 time with Social worker, Explore available resources and support systems, Assess for adequacy in community support network, Educate family and significant other(s) on suicide prevention, Complete Psychosocial Assessment, Interpersonal group therapy.  Evaluation of Outcomes: Adequate for discharge  Progress in Treatment: Attending groups: No.  Participating in groups: No. Taking medication as prescribed: Yes. Toleration medication: Yes. Family/Significant other contact made: SPE completed with pt's husband.  Patient understands diagnosis: Yes. Discussing patient identified problems/goals with staff: Yes. Medical problems stabilized or resolved: Yes. Denies suicidal/homicidal ideation: Yes. Issues/concerns per patient self-inventory: No. Other: n/a  New problem(s) identified: No, Describe:  n/a  New Short Term/Long Term Goal(s): detox, medication management for mood stabilization; elimination of SI thoughts; development of comprehensive mental wellness/sobriety plan.   Patient Goals:  "to learn ways to stop drinking and smoking cigarettes."   Discharge Plan or Barriers: Pt has been referred to [facility]. She declined residential treatment. Pt reports that her PCP prescribes her mental health medication. [place] pamphlet, Mobile Crisis information, and AA/NA information provided to  patient for additional community support and resources.    Reason for Continuation of Hospitalization: Anxiety Depression Medication stabilization  Estimated Length of Stay: Monday, 06/13/18  Attendees: Patient: 06/13/2018 8:50 AM  Physician: Dr. Parke Poisson MD; Dr. Nancy Fetter MD 06/13/2018 8:50 AM  Nursing: Yetta Flock RN; Roderic Palau RN 06/13/2018 8:50 AM  RN Care Manager:x 06/13/2018 8:50 AM  Social Worker: Janice Norrie LCSW 06/13/2018 8:50 AM  Recreational Therapist: x 06/13/2018 8:50 AM  Other: Lindell Spar NP;  06/13/2018 8:50 AM  Other:  06/13/2018 8:50 AM  Other: 06/13/2018 8:50 AM    Scribe for Treatment Team: Avelina Laine, LCSW 06/13/2018 8:50 AM

## 2018-06-13 NOTE — Discharge Summary (Signed)
Physician Discharge Summary Note  Patient:  Dorothy [name] is an 62 y.o., female MRN:  [id number] DOB:  1956/02/05 Patient phone:  [phone] (home)  Patient address:   [address] [place] [address],  Total Time spent with patient: 30 minutes  Date of Admission:  06/08/2018 Date of Discharge: 06/13/2018  Reason for Admission: depression, SI, alcohol use  Principal Problem: Severe recurrent major depression without psychotic features [facility]) Discharge Diagnoses: Patient Active Problem List   Diagnosis Date Noted  . Severe recurrent major depression without psychotic features ([place]) [F33.2] 06/08/2018  . Alcohol use disorder, severe, dependence ([name]) [F10.20] 06/08/2018    Past Psychiatric History: see H&P  Past Medical History:  Past Medical History:  Diagnosis Date  . Alcoholic ([place])   . Chronic bronchitis ([facility])    Still smoking  . Depression   . GERD (gastroesophageal reflux disease)   . Hoarseness 06/2010   Oral thrush---- Dr. Melida Quitter  . Hypertension   . Insomnia   . Malignant melanoma ([place]) 2009   Removed from left shoulder    Past Surgical History:  Procedure Laterality Date  . BASAL CELL CARCINOMA EXCISION  2012   Right side nose   . BREAST ENHANCEMENT SURGERY  2002  . ELBOW SURGERY     Right elbow surgery---Ruptured tendon   Family History:  Family History  Problem Relation Age of Onset  . Cirrhosis Father    Family Psychiatric  History: see H&P Social History:  Social History   Substance and Sexual Activity  Alcohol Use Yes   Comment: >1 pint vodka daily and wine     Social History   Substance and Sexual Activity  Drug Use Yes  . Types: Marijuana    Social History   Socioeconomic History  . Marital status: Married    Spouse name: Not on file  . Number of children: Not on file  . Years of education: Not on file  . Highest education level: Not on file  Occupational History  . Not on file  Social Needs  . Financial  resource strain: Not on file  . Food insecurity:    Worry: Not on file    Inability: Not on file  . Transportation needs:    Medical: Not on file    Non-medical: Not on file  Tobacco Use  . Smoking status: Current Every Day Smoker    Packs/day: 2.00    Types: Cigarettes  . Smokeless tobacco: Never Used  Substance and Sexual Activity  . Alcohol use: Yes    Comment: >1 pint vodka daily and wine  . Drug use: Yes    Types: Marijuana  . Sexual activity: Not on file  Lifestyle  . Physical activity:    Days per week: Not on file    Minutes per session: Not on file  . Stress: Not on file  Relationships  . Social connections:    Talks on phone: Not on file    Gets together: Not on file    Attends religious service: Not on file    Active member of club or organization: Not on file    Attends meetings of clubs or organizations: Not on file    Relationship status: Not on file  Other Topics Concern  . Not on file  Social History Narrative  . Not on file    Hospital Course:    Dorothy [name] is a 62 y/o F with history of treatment for depression and alcohol use who was  admitted from [facility] on IVC initiated by her husband due to worsening symptoms of depression, SI without plan, and worsening use of alcohol. Pt had been evaluated in [facility] one day prior to this admission after she was sent there via EMS from her PCP appointment where she arrived intoxicated, but she left AMA from the waiting room. Pt was medically cleared, started on alcohol withdrawal protocol with ativan, and she was transferred to [facility] for additional treatment and evaluation. She was started on trial of abilify in addition to be restarted on home medication of prozac which was started about 2 weeks ago. Pt had mprovement of her presenting symptoms, and she was discontinued from [place].  Today upon evaluation, pt shares, "I'm doing good." She denies any specific concerns. She is sleeping well. Her appetite is good. She  denies other physical complaints. She denies SI/HI/AH/VH. She is tolerating her medications well, and she is in agreement to continue her current regimen without changes. She plans to seek alcohol treatment through [facility] after discharge. She will return home to staying with her husband. She was able to engage in safety planning including plan to return to [facility] or contact emergency services if she feels unable to maintain her own safety or the safety of others. Pt had no further questions, comments, or concerns.   Physical Findings: AIMS: Facial and Oral Movements Muscles of Facial Expression: None, normal Lips and Perioral Area: None, normal Jaw: None, normal Tongue: None, normal,Extremity Movements Upper (arms, wrists, hands, fingers): Minimal(mild tremors) Lower (legs, knees, ankles, toes): None, normal, Trunk Movements Neck, shoulders, hips: None, normal, Overall Severity Severity of abnormal movements (highest score from questions above): None, normal Incapacitation due to abnormal movements: None, normal Patient's awareness of abnormal movements (rate only patient's report): No Awareness, Dental Status Current problems with teeth and/or dentures?: No Does patient usually wear dentures?: No  CIWA:  CIWA-Ar Total: 0 COWS:  COWS Total Score: 2  Musculoskeletal: Strength & Muscle Tone: within normal limits Gait & Station: normal Patient leans: N/A  Psychiatric Specialty Exam: Physical Exam  Nursing note and vitals reviewed.   Review of Systems  Constitutional: Negative for chills and fever.  Respiratory: Negative for cough and shortness of breath.   Cardiovascular: Negative for chest pain.  Gastrointestinal: Negative for abdominal pain, heartburn, nausea and vomiting.  Psychiatric/Behavioral: Negative for depression, hallucinations and suicidal ideas. The patient is not nervous/anxious and does not have insomnia.     Blood pressure 116/87, pulse 79, temperature 98.1 F (36.7 C),  resp. rate 18, height 5\' 6"  (1.676 m), weight 56.7 kg, SpO2 100 %.Body mass index is 20.18 kg/m.  General Appearance: Casual and Fairly Groomed  Eye Contact:  Good  Speech:  Clear and Coherent and Normal Rate  Volume:  Normal  Mood:  Euthymic  Affect:  Appropriate and Congruent  Thought Process:  Coherent and Goal Directed  Orientation:  Full (Time, Place, and Person)  Thought Content:  Logical  Suicidal Thoughts:  No  Homicidal Thoughts:  No  Memory:  Immediate;   Fair Recent;   Fair Remote;   Fair  Judgement:  Fair  Insight:  Fair  Psychomotor Activity:  Normal  Concentration:  Concentration: Fair  Recall:  [employer] of Knowledge:  Fair  Language:  Fair  Akathisia:  No  Handed:    AIMS (if indicated):     Assets:  Physical Health Resilience Social Support  ADL's:  Intact  Cognition:  WNL  Sleep:  Number of  Hours: 4.75        Has this patient used any form of tobacco in the last 30 days? (Cigarettes, Smokeless Tobacco, Cigars, and/or Pipes) Yes, Yes, A prescription for an FDA-approved tobacco cessation medication was offered at discharge and the patient refused  Blood Alcohol level:  Lab Results  Component Value Date   ETH <10 99/77/4142    Metabolic Disorder Labs:  No results found for: HGBA1C, MPG No results found for: PROLACTIN No results found for: CHOL, TRIG, HDL, CHOLHDL, VLDL, LDLCALC  See Psychiatric Specialty Exam and Suicide Risk Assessment completed by Attending Physician prior to discharge.  Discharge destination:  Home  Is patient on multiple antipsychotic therapies at discharge:  No   Has Patient had three or more failed trials of antipsychotic monotherapy by history:  No  Recommended Plan for Multiple Antipsychotic Therapies: NA   Allergies as of 06/13/2018   No Known Allergies     Medication List    STOP taking these medications   clonazePAM 1 MG tablet Commonly known as:  KLONOPIN     TAKE these medications     Indication   ARIPiprazole 5 MG tablet Commonly known as:  ABILIFY Take 1 tablet (5 mg total) by mouth daily. Start taking on:  06/14/2018  Indication:  Major Depressive Disorder   FLUoxetine 40 MG capsule Commonly known as:  PROZAC Take 1 capsule (40 mg total) by mouth daily. Start taking on:  06/14/2018 What changed:    medication strength  when to take this  Indication:  Depression   hydrOXYzine 50 MG tablet Commonly known as:  ATARAX/VISTARIL Take 1 tablet (50 mg total) by mouth every 6 (six) hours as needed for anxiety.  Indication:  Feeling Anxious   traZODone 100 MG tablet Commonly known as:  DESYREL Take 1 tablet (100 mg total) by mouth at bedtime as needed for sleep.  Indication:  Major Depressive Disorder      Follow-up Information    [facility] Follow up.   Why:  Walk in within 3 days of hospital discharge to be assessed for Substance Abuse Intensive Outpatient Program/therapy. Walk in hours: Mondays, Wednesdays, and Fridays from 11:00AM-12:30PM. Thank you.  Contact information: [place], [place] [address] Phone: [phone] Fax: [phone]       Josetta Huddle, MD Follow up on 06/13/2018.   Specialty:  Internal Medicine Why:  Hospital follow-up with Dr. Laurann Montana on Monday, 11/4 at 11:00AM for medication management. (Dr. Inda Merlin is out of the office). Thank you.  Contact information: [address] [place] [address] [phone]           Follow-up recommendations:  Activity:  as tolerated Diet:  normal Tests:  NA Other:  see above for DC plan  Comments:    Signed: Pennelope Bracken, MD 06/13/2018, 9:06 AM

## 2019-02-04 ENCOUNTER — Ambulatory Visit (HOSPITAL_COMMUNITY)
Admission: RE | Admit: 2019-02-04 | Discharge: 2019-02-04 | Disposition: A | Discharge status: Home / Self Care | Payer: Self-pay | Attending: Psychiatry | Admitting: Psychiatry

## 2019-08-21 ENCOUNTER — Other Ambulatory Visit: Payer: Self-pay

## 2019-08-21 ENCOUNTER — Emergency Department (HOSPITAL_COMMUNITY)
Admission: EM | Admit: 2019-08-21 | Discharge: 2019-08-22 | Disposition: A | Discharge status: Home / Self Care | Payer: Self-pay | Attending: Emergency Medicine | Admitting: Emergency Medicine

## 2019-08-21 DIAGNOSIS — I10 Essential (primary) hypertension: Secondary | ICD-10-CM | POA: Insufficient documentation

## 2019-08-21 DIAGNOSIS — Z79899 Other long term (current) drug therapy: Secondary | ICD-10-CM | POA: Insufficient documentation

## 2019-08-21 DIAGNOSIS — F17210 Nicotine dependence, cigarettes, uncomplicated: Secondary | ICD-10-CM | POA: Insufficient documentation

## 2019-08-21 DIAGNOSIS — F10920 Alcohol use, unspecified with intoxication, uncomplicated: Secondary | ICD-10-CM

## 2019-08-21 DIAGNOSIS — F10120 Alcohol abuse with intoxication, uncomplicated: Secondary | ICD-10-CM | POA: Insufficient documentation

## 2019-08-21 MED ORDER — STERILE WATER FOR INJECTION IJ SOLN
INTRAMUSCULAR | Status: AC
  Administered 2019-08-21: 10 mL
  Filled 2019-08-21: qty 10

## 2019-08-21 MED ORDER — LORAZEPAM 2 MG/ML IJ SOLN
1.0000 mg | Freq: Once | INTRAMUSCULAR | Status: AC
  Administered 2019-08-21: 1 mg via INTRAMUSCULAR
  Filled 2019-08-21: qty 1

## 2019-08-21 MED ORDER — ZIPRASIDONE MESYLATE 20 MG IM SOLR
20.0000 mg | Freq: Once | INTRAMUSCULAR | Status: AC
  Administered 2019-08-21: 20 mg via INTRAMUSCULAR
  Filled 2019-08-21: qty 20

## 2019-08-21 NOTE — ED Notes (Signed)
Pt dressed into a hospital gown and placed on the monitor.

## 2019-08-21 NOTE — ED Notes (Signed)
Pt continually getting more aggressive with staff members. Pt has attempted to kick and hit at staff. Pt stating things like, "go fuck yourself. You're a piece of shit. Fuck you". Pt again encouraged to be respectful of staff members and stop cussing. Pt also encouraged to stop threatening and hitting staff, as she has continued to do. Pt assisted back into the bed to keep from hurting herself or other staff members after attempting to get out of bed again. Pt unsteady while attempting to ambulate.

## 2019-08-21 NOTE — ED Notes (Signed)
Pt sleeping at this time. Pt has call bell within reach.

## 2019-08-21 NOTE — ED Notes (Signed)
While at bedside attempting to get information from pt, Joi, RN asked pt for her name, and pt stated "go fuck yourself". Pt encouraged not to use foul language. Despite encouraging pt to speak respectfully, pt continues.

## 2019-08-21 NOTE — ED Notes (Signed)
4 point restraints placed on patient. Will check restraints q15 minutes, offer food, water and toileting as necessary.

## 2019-08-21 NOTE — ED Notes (Signed)
Pt found getting out of bed and voiding on herself, after pt had been placed on the bedpan and encouraged to void.

## 2019-08-21 NOTE — ED Notes (Addendum)
This RN attempted to ask the patient her name and a full set of orientation questions and the patient responded "fuck you, you piece of shit." This RN and Nurse tech, Sam, attempted to reorient patient and placed a BP cuff, 12 lead, and pulse ox, on patient.

## 2019-08-21 NOTE — ED Triage Notes (Signed)
Per EMS: Pt is coming from a restaurant with c/o alcohol intoxication. Pt was found slumped over in the chair at her table. Pt states she had two glasses of wine. AxOx1, only knows her name.  EMS VITALS: BP 120/78 HR 100 RR 18 SPO2 94% RA  CBG 91 TEMP 97.0

## 2019-08-21 NOTE — ED Notes (Signed)
This patient became increasingly agitated and attempted to get out of bed multiple times, gait unsteady. Patient repeatedly saying "you think you're smart, fuck you." Patient assisted back to bed. Will continue to monitor.

## 2019-08-21 NOTE — ED Notes (Signed)
Pt calling out stating she needs to void again, pt offered bedpan. When pt is placed on bedpan, she begins cussing and throwing her limbs around. Bedpan removed and brief placed on pt.

## 2019-08-21 NOTE — ED Provider Notes (Signed)
[facility] DEPT Provider Note   CSN: [id number] Arrival date & time: 08/21/19  2030     History Chief Complaint  Patient presents with  . Alcohol Intoxication    Dorothy [name] is a 63 y.o. female.  HPI Patient brought in by EMS after she was found at a restaurant, apparently intoxicated by alcohol.  Patient is unable to give any history.  Level 5 caveat-altered mental status    Past Medical History:  Diagnosis Date  . Alcoholic ([place])   . Chronic bronchitis ([facility])    Still smoking  . Depression   . GERD (gastroesophageal reflux disease)   . Hoarseness 06/2010   Oral thrush---- Dr. Melida Quitter  . Hypertension   . Insomnia   . Malignant melanoma ([place]) 2009   Removed from left shoulder    Patient Active Problem List   Diagnosis Date Noted  . Severe recurrent major depression without psychotic features ([name]) 06/08/2018  . Alcohol use disorder, severe, dependence ([place]) 06/08/2018    Past Surgical History:  Procedure Laterality Date  . BASAL CELL CARCINOMA EXCISION  2012   Right side nose   . BREAST ENHANCEMENT SURGERY  2002  . ELBOW SURGERY     Right elbow surgery---Ruptured tendon     OB History   No obstetric history on file.     Family History  Problem Relation Age of Onset  . Cirrhosis Father     Social History   Tobacco Use  . Smoking status: Current Every Day Smoker    Packs/day: 2.00    Types: Cigarettes  . Smokeless tobacco: Never Used  Substance Use Topics  . Alcohol use: Yes    Comment: >1 pint vodka daily and wine  . Drug use: Yes    Types: Marijuana    Home Medications Prior to Admission medications   Medication Sig Start Date End Date Taking? Authorizing Provider  ARIPiprazole (ABILIFY) 5 MG tablet Take 1 tablet (5 mg total) by mouth daily. 06/14/18   Pennelope Bracken, MD  FLUoxetine (PROZAC) 40 MG capsule Take 1 capsule (40 mg total) by mouth daily. 06/14/18   Pennelope Bracken,  MD  hydrOXYzine (ATARAX/VISTARIL) 50 MG tablet Take 1 tablet (50 mg total) by mouth every 6 (six) hours as needed for anxiety. 06/13/18   Pennelope Bracken, MD  traZODone (DESYREL) 100 MG tablet Take 1 tablet (100 mg total) by mouth at bedtime as needed for sleep. 06/13/18   Pennelope Bracken, MD    Allergies    Patient has no known allergies.  Review of Systems   Review of Systems  Unable to perform ROS: Mental status change    Physical Exam Updated Vital Signs BP (!) 151/86   Pulse (!) 114   Temp 97.7 F (36.5 C) (Oral)   Resp 20   Wt 58.5 kg   SpO2 98%   BMI 20.82 kg/m   Physical Exam Vitals and nursing note reviewed.  Constitutional:      General: She is in acute distress (Altered mental status).     Appearance: She is well-developed. She is not ill-appearing, toxic-appearing or diaphoretic.  HENT:     Head: Normocephalic and atraumatic.     Right Ear: External ear normal.     Left Ear: External ear normal.  Eyes:     Conjunctiva/sclera: Conjunctivae normal.     Pupils: Pupils are equal, round, and reactive to light.  Neck:     Trachea: Phonation normal.  Cardiovascular:     Rate and Rhythm: Normal rate.  Pulmonary:     Effort: Pulmonary effort is normal. No respiratory distress.     Breath sounds: No stridor.  Abdominal:     General: There is no distension.     Palpations: Abdomen is soft.     Tenderness: There is no abdominal tenderness.  Musculoskeletal:        General: Normal range of motion.     Cervical back: Normal range of motion and neck supple.  Skin:    General: Skin is warm and dry.  Neurological:     Mental Status: She is alert and oriented to person, place, and time.     Cranial Nerves: No cranial nerve deficit.     Sensory: No sensory deficit.     Motor: No abnormal muscle tone.     Coordination: Coordination normal.  Psychiatric:        Mood and Affect: Mood normal.        Behavior: Behavior normal.     ED Results /  Procedures / Treatments   Labs (all labs ordered are listed, but only abnormal results are displayed) Labs Reviewed - No data to display  EKG EKG Interpretation  Date/Time:  Monday August 21 2019 21:16:59 EST Ventricular Rate:  84 PR Interval:    QRS Duration: 100 QT Interval:  391 QTC Calculation: 463 R Axis:   86 Text Interpretation: Sinus rhythm Probable left atrial enlargement Left ventricular hypertrophy Probable inferior infarct, old Since last tracing arm leads are now correctly placed Otherwise no significant change Confirmed by Daleen Bo [phone]) on 08/21/2019 10:32:37 PM   Radiology No results found.  Procedures .Critical Care Performed by: Daleen Bo, MD Authorized by: Daleen Bo, MD   Critical care provider statement:    Critical care time (minutes):  35   Critical care start time:  08/21/2019 9:05 PM   Critical care end time:  08/21/2019 11:37 PM   Critical care time was exclusive of:  Separately billable procedures and treating other patients   Critical care was necessary to treat or prevent imminent or life-threatening deterioration of the following conditions:  CNS failure or compromise   Critical care was time spent personally by me on the following activities:  Blood draw for specimens, development of treatment plan with patient or surrogate, discussions with consultants, evaluation of patient's response to treatment, examination of patient, obtaining history from patient or surrogate, ordering and performing treatments and interventions, ordering and review of laboratory studies, pulse oximetry, re-evaluation of patient's condition, review of old charts and ordering and review of radiographic studies   (including critical care time)  Medications Ordered in ED Medications  ziprasidone (GEODON) injection 20 mg (20 mg Intramuscular Given 08/21/19 2115)  LORazepam (ATIVAN) injection 1 mg (1 mg Intramuscular Given 08/21/19 2114)  sterile water  (preservative free) injection (10 mLs  Given 08/21/19 2115)    ED Course  I have reviewed the triage vital signs and the nursing notes.  Pertinent labs & imaging results that were available during my care of the patient were reviewed by me and considered in my medical decision making (see chart for details).  Clinical Course as of Aug 20 2337  Mon Aug 21, 2019  2111 Patient restrained for safety to prevent herself from harming herself.   [EW]  2336 Resting comfortably, snoring.  She has been placed on oxygen because of transient hypoxia to 88%, following sedation.   [EW]    Clinical  Course User Index [EW] Daleen Bo, MD   MDM Rules/Calculators/A&P                       Patient Vitals for the past 24 hrs:  BP Temp Temp src Pulse Resp SpO2 Weight  08/21/19 2300 (!) 151/86 -- -- (!) 114 20 98 % --  08/21/19 2230 (!) 170/81 -- -- (!) 103 20 98 % --  08/21/19 2200 (!) 151/85 -- -- 91 (!) 22 94 % --  08/21/19 2153 (!) 162/117 -- -- (!) 117 17 90 % --  08/21/19 2130 (!) 146/83 -- -- 82 (!) 26 91 % --  08/21/19 2042 140/77 97.7 F (36.5 C) Oral 80 (!) 24 91 % 58.5 kg      Medical Decision Making: Alcohol intoxication suspected, with agitated behavior.  Patient required sedation and restraint, to protect her.  Patient observed for recovery requiring prolonged emergency department stay.  CRITICAL CARE-yes Performed by: Daleen Bo   Nursing Notes Reviewed/ Care Coordinated Applicable Imaging Reviewed Interpretation of Laboratory Data incorporated into ED treatment   Disposition per oncoming provider team.  Final Clinical Impression(s) / ED Diagnoses Final diagnoses:  Alcoholic intoxication without complication [facility])    Rx / DC Orders ED Discharge Orders    None       Daleen Bo, MD 08/21/19 2338

## 2019-08-21 NOTE — ED Notes (Signed)
Silver Necklace and Bracelet placed in specimen cup and put into patient belongings bag with patient purse and clothes.

## 2019-08-22 NOTE — ED Notes (Signed)
[place] and peri care provided.

## 2019-08-22 NOTE — ED Notes (Signed)
Pt awake and spoke with registration on the phone.

## 2019-08-22 NOTE — ED Notes (Signed)
Pt currently sleeping. Pt's brief is dry at this time.

## 2019-08-22 NOTE — ED Notes (Signed)
Pt found moving around in bed. Pt given bedpan to attempt to void due to pt mumbling "bathroom". Pt did not void in the bedpan but did void in the bed. Pt changed and given new brief. Pt provided with new warm blankets. Pt is still uncooperative at this time.

## 2019-08-22 NOTE — ED Notes (Signed)
EDP made aware of patients current blood pressure of 206/99. Will continue to monitor.

## 2019-08-22 NOTE — ED Notes (Addendum)
Pt is still uncooperative and unable to follow commands. Pt was found trying to get up in bed several times. This RN, and Nurse Tech, Sam, attempted to reorient patient. Toileting and peri care provided. Patient removed from restraints due to verbal agreement to stay in bed. Will continue to monitor.

## 2019-08-22 NOTE — ED Provider Notes (Signed)
Patient is clinically sober. She is talking coherently, gait is normal, and is demonstrating rational thought process. We shall discharge her shortly, and we have discussed the warning signs of alcohol withdrawal.   Varney Biles, MD 08/22/19 [phone]

## 2019-08-22 NOTE — ED Notes (Signed)
Patient ambulated to the restroom without assistance. Gait steady.

## 2019-08-22 NOTE — ED Notes (Signed)
Pt ambulatory to BR with no assist; steady gait.

## 2019-08-22 NOTE — ED Notes (Signed)
Patient was verbalized discharge instructions. Pt had no further questions at this time. NAD. 

## 2019-08-22 NOTE — ED Notes (Signed)
Pt found soiled in stool. Pt provided with peri care and given new brief and linens. Pt given warm blanket and has call bell within reach.

## 2019-08-27 ENCOUNTER — Emergency Department (HOSPITAL_COMMUNITY)
Admission: EM | Admit: 2019-08-27 | Discharge: 2019-08-28 | Disposition: A | Discharge status: Home / Self Care | Payer: Self-pay | Attending: Emergency Medicine | Admitting: Emergency Medicine

## 2019-08-27 ENCOUNTER — Other Ambulatory Visit: Payer: Self-pay

## 2019-08-27 DIAGNOSIS — F32A Depression, unspecified: Secondary | ICD-10-CM

## 2019-08-27 DIAGNOSIS — Z85828 Personal history of other malignant neoplasm of skin: Secondary | ICD-10-CM | POA: Insufficient documentation

## 2019-08-27 DIAGNOSIS — F1094 Alcohol use, unspecified with alcohol-induced mood disorder: Secondary | ICD-10-CM

## 2019-08-27 DIAGNOSIS — F329 Major depressive disorder, single episode, unspecified: Secondary | ICD-10-CM | POA: Insufficient documentation

## 2019-08-27 DIAGNOSIS — F17210 Nicotine dependence, cigarettes, uncomplicated: Secondary | ICD-10-CM | POA: Insufficient documentation

## 2019-08-27 DIAGNOSIS — F1020 Alcohol dependence, uncomplicated: Secondary | ICD-10-CM | POA: Diagnosis present

## 2019-08-27 DIAGNOSIS — Z79899 Other long term (current) drug therapy: Secondary | ICD-10-CM | POA: Insufficient documentation

## 2019-08-27 DIAGNOSIS — Z20822 Contact with and (suspected) exposure to covid-19: Secondary | ICD-10-CM | POA: Insufficient documentation

## 2019-08-27 DIAGNOSIS — F1210 Cannabis abuse, uncomplicated: Secondary | ICD-10-CM | POA: Insufficient documentation

## 2019-08-27 DIAGNOSIS — Z590 Homelessness: Secondary | ICD-10-CM | POA: Insufficient documentation

## 2019-08-27 DIAGNOSIS — I10 Essential (primary) hypertension: Secondary | ICD-10-CM | POA: Insufficient documentation

## 2019-08-27 DIAGNOSIS — Y907 Blood alcohol level of 200-239 mg/100 ml: Secondary | ICD-10-CM | POA: Insufficient documentation

## 2019-08-27 DIAGNOSIS — F1010 Alcohol abuse, uncomplicated: Secondary | ICD-10-CM | POA: Insufficient documentation

## 2019-08-27 DIAGNOSIS — F331 Major depressive disorder, recurrent, moderate: Secondary | ICD-10-CM

## 2019-08-27 LAB — COMPREHENSIVE METABOLIC PANEL
ALT: 27 U/L (ref 0–44)
AST: 46 U/L — ABNORMAL HIGH (ref 15–41)
Albumin: 4.4 g/dL (ref 3.5–5.0)
Alkaline Phosphatase: 72 U/L (ref 38–126)
Anion gap: 13 (ref 5–15)
BUN: 9 mg/dL (ref 8–23)
CO2: 26 mmol/L (ref 22–32)
Calcium: 8.8 mg/dL — ABNORMAL LOW (ref 8.9–10.3)
Chloride: 101 mmol/L (ref 98–111)
Creatinine, Ser: 0.37 mg/dL — ABNORMAL LOW (ref 0.44–1.00)
GFR calc Af Amer: >60 mL/min (ref >60)
GFR calc non Af Amer: >60 mL/min (ref >60)
Glucose, Bld: 78 mg/dL (ref 70–99)
Potassium: 4 mmol/L (ref 3.5–5.1)
Sodium: 140 mmol/L (ref 135–145)
Total Bilirubin: 0.5 mg/dL (ref 0.3–1.2)
Total Protein: 7.2 g/dL (ref 6.5–8.1)

## 2019-08-27 LAB — ACETAMINOPHEN LEVEL: Acetaminophen (Tylenol), Serum: <10 ug/mL — ABNORMAL LOW (ref 10–30)

## 2019-08-27 LAB — ETHANOL: Alcohol, Ethyl (B): 236 mg/dL — ABNORMAL HIGH (ref <10)

## 2019-08-27 LAB — CBC
HCT: 44.6 % (ref 36.0–46.0)
Hemoglobin: 15 g/dL (ref 12.0–15.0)
MCH: 32.6 pg (ref 26.0–34.0)
MCHC: 33.6 g/dL (ref 30.0–36.0)
MCV: 97 fL (ref 80.0–100.0)
Platelets: 242 10*3/uL (ref 150–400)
RBC: 4.6 MIL/uL (ref 3.87–5.11)
RDW: 15 % (ref 11.5–15.5)
WBC: 6.6 10*3/uL (ref 4.0–10.5)
nRBC: 0 % (ref 0.0–0.2)

## 2019-08-27 LAB — SALICYLATE LEVEL: Salicylate Lvl: <7 mg/dL — ABNORMAL LOW (ref 7.0–30.0)

## 2019-08-27 LAB — RAPID URINE DRUG SCREEN, HOSP PERFORMED
Amphetamines: NOT DETECTED
Barbiturates: NOT DETECTED
Benzodiazepines: NOT DETECTED
Cocaine: NOT DETECTED
Opiates: NOT DETECTED
Tetrahydrocannabinol: NOT DETECTED

## 2019-08-27 MED ORDER — LORAZEPAM 2 MG/ML IJ SOLN: 0.0000 mg | Freq: Two times a day (BID) | INTRAMUSCULAR | Status: DC

## 2019-08-27 MED ORDER — LORAZEPAM 2 MG/ML IJ SOLN: 0.0000 mg | Freq: Four times a day (QID) | INTRAMUSCULAR | Status: DC

## 2019-08-27 MED ORDER — AMLODIPINE BESYLATE 5 MG PO TABS
5.0000 mg | ORAL_TABLET | Freq: Every day | ORAL | Status: DC
  Administered 2019-08-28: 5 mg via ORAL
  Filled 2019-08-27: qty 1

## 2019-08-27 MED ORDER — HYDROCHLOROTHIAZIDE 12.5 MG PO CAPS
12.5000 mg | ORAL_CAPSULE | Freq: Every day | ORAL | Status: DC
  Administered 2019-08-28: 12.5 mg via ORAL
  Filled 2019-08-27: qty 1

## 2019-08-27 MED ORDER — TRAZODONE HCL 50 MG PO TABS
150.0000 mg | ORAL_TABLET | Freq: Every evening | ORAL | Status: DC | PRN
  Administered 2019-08-27: 150 mg via ORAL

## 2019-08-27 MED ORDER — THIAMINE HCL 100 MG PO TABS
100.0000 mg | ORAL_TABLET | Freq: Every day | ORAL | Status: DC
  Administered 2019-08-27 – 2019-08-28 (×2): 100 mg via ORAL
  Filled 2019-08-27 (×2): qty 1

## 2019-08-27 MED ORDER — NICOTINE 21 MG/24HR TD PT24
21.0000 mg | MEDICATED_PATCH | Freq: Once | TRANSDERMAL | Status: AC
  Administered 2019-08-27: 21 mg via TRANSDERMAL
  Filled 2019-08-27: qty 1

## 2019-08-27 MED ORDER — THIAMINE HCL 100 MG/ML IJ SOLN: 100.0000 mg | Freq: Every day | INTRAMUSCULAR | Status: DC

## 2019-08-27 MED ORDER — TRAZODONE HCL 50 MG PO TABS
ORAL_TABLET | ORAL | Status: AC
  Filled 2019-08-27: qty 1

## 2019-08-27 MED ORDER — ZOLPIDEM TARTRATE 5 MG PO TABS: 5.0000 mg | ORAL_TABLET | Freq: Every evening | ORAL | Status: DC | PRN

## 2019-08-27 MED ORDER — LOSARTAN POTASSIUM 50 MG PO TABS
50.0000 mg | ORAL_TABLET | Freq: Every day | ORAL | Status: DC
  Administered 2019-08-28: 50 mg via ORAL
  Filled 2019-08-27: qty 1

## 2019-08-27 MED ORDER — ONDANSETRON HCL 4 MG PO TABS: 4.0000 mg | ORAL_TABLET | Freq: Three times a day (TID) | ORAL | Status: DC | PRN

## 2019-08-27 MED ORDER — LORAZEPAM 1 MG PO TABS
0.0000 mg | ORAL_TABLET | Freq: Four times a day (QID) | ORAL | Status: DC
  Administered 2019-08-27: 2 mg via ORAL
  Administered 2019-08-28: 1 mg via ORAL
  Filled 2019-08-27: qty 2
  Filled 2019-08-27: qty 1

## 2019-08-27 MED ORDER — FLUOXETINE HCL 20 MG PO TABS
20.0000 mg | ORAL_TABLET | Freq: Every day | ORAL | Status: DC
  Filled 2019-08-27: qty 1

## 2019-08-27 MED ORDER — LOSARTAN POTASSIUM-HCTZ 50-12.5 MG PO TABS: 0.5000 | ORAL_TABLET | Freq: Every day | ORAL | Status: DC

## 2019-08-27 MED ORDER — ALUM & MAG HYDROXIDE-SIMETH 200-200-20 MG/5ML PO SUSP: 30.0000 mL | Freq: Four times a day (QID) | ORAL | Status: DC | PRN

## 2019-08-27 MED ORDER — TRAZODONE HCL 100 MG PO TABS
ORAL_TABLET | ORAL | Status: AC
  Filled 2019-08-27: qty 1

## 2019-08-27 MED ORDER — LORAZEPAM 1 MG PO TABS: 0.0000 mg | ORAL_TABLET | Freq: Two times a day (BID) | ORAL | Status: DC

## 2019-08-27 MED ORDER — IBUPROFEN 200 MG PO TABS
600.0000 mg | ORAL_TABLET | Freq: Three times a day (TID) | ORAL | Status: DC | PRN
  Administered 2019-08-27: 600 mg via ORAL
  Filled 2019-08-27: qty 3

## 2019-08-27 NOTE — ED Provider Notes (Addendum)
[place] DEPT Provider Note   CSN: [id number] Arrival date & time: 08/27/19  1517     History Chief Complaint  Patient presents with  . Alcohol Problem    Dorothy [name] is a 64 y.o. female with history of chronic bronchitis, alcoholism, GERD presents for evaluation of her alcoholism.  She tells me that she has been drinking "for a long time".  She will not tell me how much and states that she drinks liquor, beer, and wine.  Reports she has only had 1 glass of wine today.  She also tells me that she is recently homeless and expresses significant depression surrounding this.  She denies suicidal or homicidal ideation or auditory or visual hallucinations.  She is a current smoker, occasionally smokes marijuana.  Denies any other complaints.   The history is provided by the patient.       Past Medical History:  Diagnosis Date  . Alcoholic ([place])   . Chronic bronchitis (HCC)    Still smoking  . Depression   . GERD (gastroesophageal reflux disease)   . Hoarseness 06/2010   Oral thrush---- Dr. Melida Quitter  . Hypertension   . Insomnia   . Malignant melanoma ([place]) 2009   Removed from left shoulder    Patient Active Problem List   Diagnosis Date Noted  . Severe recurrent major depression without psychotic features ([place]) 06/08/2018  . Alcohol use disorder, severe, dependence ([place]) 06/08/2018    Past Surgical History:  Procedure Laterality Date  . BASAL CELL CARCINOMA EXCISION  2012   Right side nose   . BREAST ENHANCEMENT SURGERY  2002  . ELBOW SURGERY     Right elbow surgery---Ruptured tendon     OB History   No obstetric history on file.     Family History  Problem Relation Age of Onset  . Cirrhosis Father     Social History   Tobacco Use  . Smoking status: Current Every Day Smoker    Packs/day: 2.00    Types: Cigarettes  . Smokeless tobacco: Never Used  Substance Use Topics  . Alcohol use: Yes    Comment: >1 pint  vodka daily and wine  . Drug use: Yes    Types: Marijuana    Home Medications Prior to Admission medications   Medication Sig Start Date End Date Taking? Authorizing Provider  amLODipine (NORVASC) 5 MG tablet Take 5 mg by mouth daily. 05/24/19  Yes [provider]  FLUoxetine (PROZAC) 20 MG tablet Take 20 mg by mouth daily.   Yes [provider]  losartan-hydrochlorothiazide (HYZAAR) 50-12.5 MG tablet Take 0.5 tablets by mouth daily.    Yes [provider]  traZODone (DESYREL) 150 MG tablet Take 150 mg by mouth at bedtime as needed for sleep.   Yes [provider]  ARIPiprazole (ABILIFY) 5 MG tablet Take 1 tablet (5 mg total) by mouth daily. Patient not taking: Reported on 08/27/2019 06/14/18   Pennelope Bracken, MD  FLUoxetine (PROZAC) 40 MG capsule Take 1 capsule (40 mg total) by mouth daily. Patient not taking: Reported on 08/27/2019 06/14/18   Pennelope Bracken, MD  hydrOXYzine (ATARAX/VISTARIL) 50 MG tablet Take 1 tablet (50 mg total) by mouth every 6 (six) hours as needed for anxiety. Patient not taking: Reported on 08/27/2019 06/13/18   Pennelope Bracken, MD  traZODone (DESYREL) 100 MG tablet Take 1 tablet (100 mg total) by mouth at bedtime as needed for sleep. Patient not taking: Reported on  08/27/2019 06/13/18   Pennelope Bracken, MD    Allergies    Patient has no known allergies.  Review of Systems   Review of Systems  Constitutional: Negative for chills and fever.  Respiratory: Negative for shortness of breath.   Cardiovascular: Negative for chest pain.  Gastrointestinal: Negative for abdominal pain, nausea and vomiting.  Psychiatric/Behavioral: Positive for dysphoric mood. Negative for self-injury and suicidal ideas.  All other systems reviewed and are negative.   Physical Exam Updated Vital Signs BP (!) 146/93 (BP Location: Right Arm)   Pulse 71   Temp 97.7 F (36.5 C) (Oral)   Resp 18   SpO2 95%    Physical Exam Vitals and nursing note reviewed.  Constitutional:      General: She is not in acute distress.    Appearance: She is well-developed.  HENT:     Head: Normocephalic and atraumatic.  Eyes:     General:        Right eye: No discharge.        Left eye: No discharge.     Conjunctiva/sclera: Conjunctivae normal.  Neck:     Vascular: No JVD.     Trachea: No tracheal deviation.  Cardiovascular:     Rate and Rhythm: Normal rate and regular rhythm.  Pulmonary:     Effort: Pulmonary effort is normal.     Breath sounds: Normal breath sounds.  Abdominal:     General: Abdomen is flat. Bowel sounds are normal. There is no distension.     Palpations: Abdomen is soft.     Tenderness: There is no abdominal tenderness. There is no guarding or rebound.  Skin:    General: Skin is warm and dry.     Findings: No erythema.  Neurological:     Mental Status: She is alert.     Comments: Mildly confused today of the week but oriented to time, person and place otherwise.  Moves all extremities spontaneously without difficulty.  Speech is fluent without dysarthria or aphasia.  Psychiatric:        Attention and Perception: Attention normal.        Mood and Affect: Mood is depressed.        Speech: Speech normal.        Behavior: Behavior is withdrawn. Behavior is cooperative.        Thought Content: Thought content does not include homicidal or suicidal ideation. Thought content does not include homicidal or suicidal plan.        Judgment: Judgment is impulsive.     ED Results / Procedures / Treatments   Labs (all labs ordered are listed, but only abnormal results are displayed) Labs Reviewed  COMPREHENSIVE METABOLIC PANEL - Abnormal; Notable for the following components:      Result Value   Creatinine, Ser 0.37 (*)    Calcium 8.8 (*)    AST 46 (*)    All other components within normal limits  ETHANOL - Abnormal; Notable for the following components:   Alcohol, Ethyl (B) 236 (*)     All other components within normal limits  ACETAMINOPHEN LEVEL - Abnormal; Notable for the following components:   Acetaminophen (Tylenol), Serum <10 (*)    All other components within normal limits  SALICYLATE LEVEL - Abnormal; Notable for the following components:   Salicylate Lvl [id number] (*)    All other components within normal limits  SARS CORONAVIRUS 2 (TAT 6-24 HRS)  CBC  RAPID URINE DRUG SCREEN, HOSP PERFORMED  EKG None  Radiology No results found.  Procedures Procedures (including critical care time)  Medications Ordered in ED Medications  nicotine (NICODERM CQ - dosed in mg/24 hours) patch 21 mg (21 mg Transdermal Patch Applied 08/27/19 1657)  LORazepam (ATIVAN) injection 0-4 mg ( Intravenous See Alternative 08/27/19 1855)    Or  LORazepam (ATIVAN) tablet 0-4 mg (0 mg Oral Not Given 08/27/19 1855)  LORazepam (ATIVAN) injection 0-4 mg (has no administration in time range)    Or  LORazepam (ATIVAN) tablet 0-4 mg (has no administration in time range)  thiamine tablet 100 mg (100 mg Oral Given 08/27/19 1901)    Or  thiamine (B-1) injection 100 mg ( Intravenous See Alternative 08/27/19 1901)    ED Course  I have reviewed the triage vital signs and the nursing notes.  Pertinent labs & imaging results that were available during my care of the patient were reviewed by me and considered in my medical decision making (see chart for details).    MDM Rules/Calculators/A&P                      Patient presenting for evaluation of alcohol abuse.  She is afebrile, vital signs are stable.  She is nontoxic in appearance.  She exhibits fluent speech, does not appear clinically intoxicated however her ethanol level today is 236.  Physical examination is reassuring.  Screening labs reviewed by me show no leukocytosis, no anemia, no metabolic derangements, no renal insufficiency.  Her UDS is negative.  She is interested in resources for quitting alcohol.  Does not appear to be clinically  withdrawing at this time but will place on CIWA protocol.   TTS recommends overnight observation in a.m. psychiatry reevaluation.  Of note patient is here voluntarily may require IVC if she attempts to leave prior to the reassessment.   Final Clinical Impression(s) / ED Diagnoses Final diagnoses:  Alcohol abuse  Depression, unspecified depression type    Rx / DC Orders ED Discharge Orders    None         Debroah Baller 08/27/19 1945    Daleen Bo, MD 08/27/19 2140

## 2019-08-27 NOTE — ED Triage Notes (Signed)
Pt came to [facility] voluntary. Pt brought by daughter. Pt stated she was drinking and an alcoholic. Pt denied SI/HI and A/V  Hallucinations.

## 2019-08-27 NOTE — BH Assessment (Signed)
Tele Assessment Note   Patient Name: Dorothy [name] MRN: [id number] Referring Physician: Dr. Christ Kick, MD Location of Patient: [name] Location of Provider: [place] is a 64 y.o. female who voluntarily came to [facility] seeking treatment for depression and alcohol use.  Pt states, "my daughter dropped me off to get help because I drink too much.  Alcohol made me lose every thing.  I loss my husband after 76 year, my house I have nothing. I'm homeless.  Wouldn't you be depressed?"  Pt was not able to state how much she drinks.  Pt states, "I drink every day, I don't know how much.  I had a few glasses of wine today."  Pt denies using any other substances to this Probation officer.  Pt admits to visual hallucinations.  Pt states, "I see purple elephants all the time."  Pt denies SI/HI/A-hallucinations.    Pt recently sold her home and unofficially separated from her husband after 24 years of marriage.  Pt has been living in a hotel for a month.  Pt does not work.  Pt reports being treated inpatient for MH/SA 01/2019.  Pt receives outpatient counseling from [facility] [place] and medication management from her PCP.  Pt denies a history of physical, sexual and verbal abuse.   Patient was wearing scrubs and appeared appropriately groomed.  Pt was alert throughout the assessment.  Patient made poor eye contact and had abnormal psychomotor activity.  Patient spoke in a normal voice without pressured speech.  Pt expressed feeling depressed.  Pt's affect appeared dysphoric and congruent with stated mood. Pt's thought process was coherent and logical.  Pt presented with poor insight and judgement.  Pt appeared to be responding to internal stimuli.  Pt stated she would not hurt herself but would not consent to a family collateral.  Disposition: [facility] discussed case with [place] Provider, Letitia Libra, NP who recommends the pt be observed  overnight for safety and stability and reevaluated in the AM by psychiatry.  Diagnosis:   F33.1      Major Depressive Disorder, Moderate Recurrent Episode                      F10.20   Alcohol Use Disorder, Severe  Past Medical History:  Past Medical History:  Diagnosis Date  . Alcoholic ([place])   . Chronic bronchitis (HCC)    Still smoking  . Depression   . GERD (gastroesophageal reflux disease)   . Hoarseness 06/2010   Oral thrush---- Dr. Melida Quitter  . Hypertension   . Insomnia   . Malignant melanoma ([place]) 2009   Removed from left shoulder    Past Surgical History:  Procedure Laterality Date  . BASAL CELL CARCINOMA EXCISION  2012   Right side nose   . BREAST ENHANCEMENT SURGERY  2002  . ELBOW SURGERY     Right elbow surgery---Ruptured tendon    Family History:  Family History  Problem Relation Age of Onset  . Cirrhosis Father     Social History:  reports that she has been smoking cigarettes. She has been smoking about 2.00 packs per day. She has never used smokeless tobacco. She reports current alcohol use. She reports current drug use. Drug: Marijuana.  Additional Social History:  Alcohol / Drug Use Pain Medications: See MARs Prescriptions: See MARs Over the Counter: See MARs History of alcohol / drug use?: Yes Negative  Consequences of Use: Personal relationships, Financial Substance #1 Name of Substance 1: Alcohol 1 - Age of First Use: unknown 1 - Amount (size/oz): unknown 1 - Frequency: daily 1 - Duration: ongoing 1 - Last Use / Amount: PTA "couple of glasses of wine"  CIWA: CIWA-Ar BP: (!) 146/93 Pulse Rate: 71 Nausea and Vomiting: no nausea and no vomiting Tactile Disturbances: none Tremor: no tremor Auditory Disturbances: not present Paroxysmal Sweats: no sweat visible Visual Disturbances: not present Anxiety: no anxiety, at ease Headache, Fullness in Head: none present Agitation: normal activity Orientation and Clouding of Sensorium: oriented  and can do serial additions CIWA-Ar Total: 0 COWS:    Allergies: No Known Allergies  Home Medications: (Not in a hospital admission)   OB/GYN Status:  No LMP recorded. Patient is postmenopausal.  General Assessment Data Location of Assessment: [facility] ED TTS Assessment: In system Is this a Tele or Face-to-Face Assessment?: Tele Assessment Is this an Initial Assessment or a Re-assessment for this encounter?: Initial Assessment Patient Accompanied by:: N/A Language Other than English: No Living Arrangements: Homeless/Shelter(pt reports being homeless for 1 month) What gender do you identify as?: Female Marital status: Separated(1 month ago husband left pt) Elwin Sleight name: Tamala Julian Pregnancy Status: No Living Arrangements: Other (Comment)(lives in a motel through next week) Can pt return to current living arrangement?: Yes Admission Status: Voluntary Is patient capable of signing voluntary admission?: Yes Referral Source: Self/Family/Friend     Crisis Care Plan Living Arrangements: Other (Comment)(lives in a motel through next week) Name of Psychiatrist: None Name of Therapist: Corene Cornea  Education Status Is patient currently in school?: No Is the patient employed, unemployed or receiving disability?: Unemployed  Risk to self with the past 6 months Suicidal Ideation: No Has patient been a risk to self within the past 6 months prior to admission? : No Suicidal Intent: No Has patient had any suicidal intent within the past 6 months prior to admission? : No Is patient at risk for suicide?: No Suicidal Plan?: No Has patient had any suicidal plan within the past 6 months prior to admission? : No Access to Means: No What has been your use of drugs/alcohol within the last 12 months?: Alcohol Previous Attempts/Gestures: No Triggers for Past Attempts: None known Family Suicide History: No Recent stressful life event(s): Conflict (Comment), Divorce, Financial Problems Persecutory  voices/beliefs?: No Depression: Yes Depression Symptoms: Insomnia, Feeling worthless/self pity, Feeling angry/irritable Substance abuse history and/or treatment for substance abuse?: No Suicide prevention information given to non-admitted patients: Not applicable  Risk to Others within the past 6 months Homicidal Ideation: No Does patient have any lifetime risk of violence toward others beyond the six months prior to admission? : No Thoughts of Harm to Others: No Current Homicidal Intent: No Current Homicidal Plan: No Access to Homicidal Means: No History of harm to others?: No Assessment of Violence: None Noted Does patient have access to weapons?: No Criminal Charges Pending?: No Does patient have a court date: No Is patient on probation?: No  Psychosis Hallucinations: Visual Delusions: None noted  Mental Status Report Appearance/Hygiene: In scrubs Eye Contact: Poor Motor Activity: Agitation Speech: Aggressive, Logical/coherent Level of Consciousness: Alert, Quiet/awake, Irritable Mood: Angry, Guilty Affect: Angry, Appropriate to circumstance, Depressed Anxiety Level: None Thought Processes: Coherent, Relevant Judgement: Partial Orientation: Person, Place, Time, Appropriate for developmental age Obsessive Compulsive Thoughts/Behaviors: None  Cognitive Functioning Concentration: Normal Memory: Recent Intact, Remote Intact Is patient IDD: No Insight: Fair Impulse Control: Poor Appetite: Fair Have you had any weight  changes? : No Change Sleep: Decreased Total Hours of Sleep: 2 Vegetative Symptoms: None  ADLScreening [facility]) Patient's cognitive ability adequate to safely complete daily activities?: Yes Patient able to express need for assistance with ADLs?: Yes Independently performs ADLs?: Yes (appropriate for developmental age)  Prior Inpatient Therapy Prior Inpatient Therapy: Yes Prior Therapy Dates: 2019 Prior Therapy Facilty/Provider(s):  [facility] Reason for Treatment: MH/SA  Prior Outpatient Therapy Prior Outpatient Therapy: Yes Prior Therapy Dates: ongoing Prior Therapy Facilty/Provider(s): [facility]) Reason for Treatment: SA/MH Does patient have an ACCT team?: No Does patient have Intensive In-House Services?  : No Does patient have [facility] services? : No Does patient have P4CC services?: No  ADL Screening (condition at time of admission) Patient's cognitive ability adequate to safely complete daily activities?: Yes Is the patient deaf or have difficulty hearing?: No Does the patient have difficulty seeing, even when wearing glasses/contacts?: No Does the patient have difficulty concentrating, remembering, or making decisions?: No Patient able to express need for assistance with ADLs?: Yes Does the patient have difficulty dressing or bathing?: No Independently performs ADLs?: Yes (appropriate for developmental age) Does the patient have difficulty walking or climbing stairs?: No Weakness of Legs: None Weakness of Arms/Hands: None  Home Assistive Devices/Equipment Home Assistive Devices/Equipment: None    Abuse/Neglect Assessment (Assessment to be complete while patient is alone) Abuse/Neglect Assessment Can Be Completed: Yes Physical Abuse: Denies Verbal Abuse: Denies Sexual Abuse: Denies Exploitation of patient/patient's resources: Denies     Regulatory affairs officer (For Healthcare) Does Patient Have a Medical Advance Directive?: No Would patient like information on creating a medical advance directive?: No - Patient declined Nutrition Screen- [facility] Patient's home diet: NPO        Disposition: [facility] discussed case with [place] Provider, Letitia Libra, NP who recommends the pt be observed overnight for safety and stability and reevaluated in the AM by psychiatry.  Disposition Initial Assessment Completed for this Encounter: Yes(Per Letitia Libra, NP) Disposition of Patient:  (Observe overnight for safety and stability)  This service was provided via telemedicine using a 2-way, interactive audio and video technology.  Names of all persons participating in this telemedicine service and their role in this encounter. Name: [place] Role: Patient  Name: Sylvester Harder, MS, [facility], [place] Role: Triage Specialist  Name: Letitia Libra, NP Role: [facility] Provider  Name:  Role:     Sylvester Harder, [place], [facility], [facility] 08/27/2019 5:17 PM

## 2019-08-27 NOTE — BHH Counselor (Signed)
  [place] ASSESSMENT DISPOSITION  [facility] discussed case with [place] Provider, Letitia Libra, NP who recommends the pt be observed overnight for safety and stability and reevaluated in the AM by psychiatry.  Zaray Gatchel L. [place], [place], [facility], [facility] Therapeutic Triage Specialist  [phone]

## 2019-08-28 ENCOUNTER — Encounter (HOSPITAL_COMMUNITY): Payer: Self-pay | Admitting: Registered Nurse

## 2019-08-28 DIAGNOSIS — F1094 Alcohol use, unspecified with alcohol-induced mood disorder: Secondary | ICD-10-CM

## 2019-08-28 DIAGNOSIS — F331 Major depressive disorder, recurrent, moderate: Secondary | ICD-10-CM

## 2019-08-28 DIAGNOSIS — F1020 Alcohol dependence, uncomplicated: Secondary | ICD-10-CM

## 2019-08-28 LAB — SARS CORONAVIRUS 2 (TAT 6-24 HRS): SARS Coronavirus 2: NEGATIVE

## 2019-08-28 MED ORDER — FLUOXETINE HCL 20 MG PO CAPS
20.0000 mg | ORAL_CAPSULE | Freq: Every day | ORAL | Status: DC
  Administered 2019-08-28: 20 mg via ORAL
  Filled 2019-08-28: qty 1

## 2019-08-28 NOTE — ED Notes (Signed)
Pt discharged safely with all belongings .   Discharge instructions were reviewed.

## 2019-08-28 NOTE — Patient Outreach (Signed)
ED Peer Support Specialist Patient Intake (Complete at intake & 30-60 Day Follow-up)  Name: Dorothy [name]  MRN: [id number]  Age: 64 y.o.   Date of Admission: 08/28/2019  Intake: Initial Comments:      Primary Reason Admitted: Alcohol abuse Depression, unspecified depression type   Lab values: Alcohol/ETOH: Positive Positive UDS? No Amphetamines: No Barbiturates: No Benzodiazepines: No Cocaine: No Opiates: No Cannabinoids: No  Demographic information: Gender: Female Ethnicity: White Marital Status: Single Insurance Status: Uninsured/Self-pay Ecologist (Work Neurosurgeon, Physicist, medical, etc.: No Lives with: Alone Living situation: House/Apartment  Reported Patient History: Patient reported health conditions: None Patient aware of HIV and hepatitis status: No  In past year, has patient visited ED for any reason? No  Number of ED visits:    Reason(s) for visit:    In past year, has patient been hospitalized for any reason? No  Number of hospitalizations:    Reason(s) for hospitalization:    In past year, has patient been arrested? No  Number of arrests:    Reason(s) for arrest:    In past year, has patient been incarcerated? No  Number of incarcerations:    Reason(s) for incarceration:    In past year, has patient received medication-assisted treatment? No  In past year, patient received the following treatments:    In past year, has patient received any harm reduction services? No  Did this include any of the following?    In past year, has patient received care from a mental health provider for diagnosis other than SUD? No  In past year, is this first time patient has overdosed? No  Number of past overdoses:    In past year, is this first time patient has been hospitalized for an overdose? No  Number of hospitalizations for overdose(s):    Is patient currently receiving treatment for a mental health diagnosis?  No  Patient reports experiencing difficulty participating in SUD treatment: No    Most important reason(s) for this difficulty?    Has patient received prior services for treatment? Yes  In past, patient has received services from following agencies: Other (comment)  Plan of Care:  Suggested follow up at these agencies/treatment centers: Other (comment)  Other information: CPSS processed with Pt an was able to complete series of questions. CPSS was made aware that Pt wants assistance with her Alcohol Abuse, Pt stated that she has been at [facility] not to long ago an would not mind going back. CPSS contacted [facility] in attempt to get Pt set up for services. CPSS gave Pt contact information for [facility] an CPSS contact information to stay in contact for  [employer].   Aaron Edelman Jaisen Wiltrout, CPSS  08/28/2019 1:43 PM

## 2019-08-28 NOTE — Consult Note (Signed)
[facility] Psych ED Discharge  08/28/2019 10:31 AM [name]  MRN:  [id number] Principal Problem: Alcohol-induced mood disorder [facility]) Discharge Diagnoses: Principal Problem:   Alcohol-induced mood disorder ([place]) Active Problems:   Alcohol use disorder, severe, dependence ([place])   MDD (major depressive disorder), recurrent episode, moderate ([place])   Subjective: Dorothy [name], 64 y.o., female patient seen via tele psych by this provider, Dr. Dwyane Dee; and chart reviewed on 08/28/19.  On evaluation Dorothy [name] reports she came to the hospital while intoxicated seeking resources for referrals and resources for alcohol use disorder.  Patient states that she was living in a motel room but currently she is homeless.  States she has been very supportive but she cannot live with them related to her alcohol use.  Patient states that she is currently unemployed.  Patient reports that she needs help with housing and a place to get clean.  Patient states that she has been to [facility] in the past and last time was in June 2020 and was there for 3 weeks..  Patient states that she has had outpatient psychiatric services in the past but none currently.  States she does not remember if she was referred to any outpatient psychiatric services when she got out of [facility] and if they did she did not follow-up.  At this time patient denies suicidal/self-harm/homicidal ideations, psychosis, and paranoia.  Discussed peers support with patient for referral and resources for rehab, community services for substance use disorder, and [facility]. During evaluation LINNAEA BOLLING is alert/oriented x 4; calm/cooperative; and mood is congruent with affect.  She does not appear to be responding to internal/external stimuli or delusional thoughts.  Patient denies suicidal/self-harm/homicidal ideation, psychosis, and paranoia.  Patient answered question appropriately.     Total Time spent with patient: 30 minutes  Past  Psychiatric History: Alcohol use disorder major depression  Past Medical History:  Past Medical History:  Diagnosis Date  . Alcoholic ([place])   . Chronic bronchitis (HCC)    Still smoking  . Depression   . GERD (gastroesophageal reflux disease)   . Hoarseness 06/2010   Oral thrush---- Dr. Melida Quitter  . Hypertension   . Insomnia   . Malignant melanoma ([place]) 2009   Removed from left shoulder    Past Surgical History:  Procedure Laterality Date  . BASAL CELL CARCINOMA EXCISION  2012   Right side nose   . BREAST ENHANCEMENT SURGERY  2002  . ELBOW SURGERY     Right elbow surgery---Ruptured tendon   Family History:  Family History  Problem Relation Age of Onset  . Cirrhosis Father    Family Psychiatric  History: Denies Social History:  Social History   Substance and Sexual Activity  Alcohol Use Yes   Comment: >1 pint vodka daily and wine     Social History   Substance and Sexual Activity  Drug Use Yes  . Types: Marijuana    Social History   Socioeconomic History  . Marital status: Married    Spouse name: Not on file  . Number of children: Not on file  . Years of education: Not on file  . Highest education level: Not on file  Occupational History  . Not on file  Tobacco Use  . Smoking status: Current Every Day Smoker    Packs/day: 2.00    Types: Cigarettes  . Smokeless tobacco: Never Used  Substance and Sexual Activity  . Alcohol use: Yes    Comment: >1 pint vodka  daily and wine  . Drug use: Yes    Types: Marijuana  . Sexual activity: Not on file  Other Topics Concern  . Not on file  Social History Narrative  . Not on file   Social Determinants of Health   Financial Resource Strain:   . Difficulty of Paying Living Expenses: Not on file  Food Insecurity:   . Worried About Charity fundraiser in the Last Year: Not on file  . Ran Out of Food in the Last Year: Not on file  Transportation Needs:   . Lack of Transportation (Medical): Not on file  .  Lack of Transportation (Non-Medical): Not on file  Physical Activity:   . Days of Exercise per Week: Not on file  . Minutes of Exercise per Session: Not on file  Stress:   . Feeling of Stress : Not on file  Social Connections:   . Frequency of Communication with Friends and Family: Not on file  . Frequency of Social Gatherings with Friends and Family: Not on file  . Attends Religious Services: Not on file  . Active Member of Clubs or Organizations: Not on file  . Attends Archivist Meetings: Not on file  . Marital Status: Not on file    Has this patient used any form of tobacco in the last 30 days? (Cigarettes, Smokeless Tobacco, Cigars, and/or Pipes) A prescription for an FDA-approved tobacco cessation medication was offered at discharge and the patient refused  Current Medications: Current Facility-Administered Medications  Medication Dose Route Frequency Provider Last Rate Last Admin  . alum & mag hydroxide-simeth (MAALOX/MYLANTA) 200-200-20 MG/5ML suspension 30 mL  30 mL Oral Q6H PRN Fawze, Mina A, PA-C      . amLODipine (NORVASC) tablet 5 mg  5 mg Oral Daily Fawze, Mina A, PA-C      . FLUoxetine (PROZAC) capsule 20 mg  20 mg Oral Daily Fawze, Mina A, PA-C      . losartan (COZAAR) tablet 50 mg  50 mg Oral Daily Daleen Bo, MD       And  . hydrochlorothiazide (MICROZIDE) capsule 12.5 mg  12.5 mg Oral Daily Daleen Bo, MD      . ibuprofen (ADVIL) tablet 600 mg  600 mg Oral Q8H PRN Fawze, Mina A, PA-C   600 mg at 08/27/19 2026  . LORazepam (ATIVAN) injection 0-4 mg  0-4 mg Intravenous Q6H Fawze, Mina A, PA-C       Or  . LORazepam (ATIVAN) tablet 0-4 mg  0-4 mg Oral Q6H Fawze, Mina A, PA-C   1 mg at 08/28/19 0819  . [START ON 08/30/2019] LORazepam (ATIVAN) injection 0-4 mg  0-4 mg Intravenous Q12H Fawze, Mina A, PA-C       Or  . [START ON 08/30/2019] LORazepam (ATIVAN) tablet 0-4 mg  0-4 mg Oral Q12H Fawze, Mina A, PA-C      . nicotine (NICODERM CQ - dosed in mg/24  hours) patch 21 mg  21 mg Transdermal Once Fawze, Mina A, PA-C   21 mg at 08/27/19 1657  . ondansetron (ZOFRAN) tablet 4 mg  4 mg Oral Q8H PRN Fawze, Mina A, PA-C      . thiamine tablet 100 mg  100 mg Oral Daily Fawze, Mina A, PA-C   100 mg at 08/27/19 1901   Or  . thiamine (B-1) injection 100 mg  100 mg Intravenous Daily Fawze, Mina A, PA-C      . traZODone (DESYREL) tablet 150 mg  150 mg Oral QHS PRN Rodell Perna A, PA-C   150 mg at 08/27/19 2211   Current Outpatient Medications  Medication Sig Dispense Refill  . amLODipine (NORVASC) 5 MG tablet Take 5 mg by mouth daily.    Marland Kitchen FLUoxetine (PROZAC) 20 MG tablet Take 20 mg by mouth daily.    Marland Kitchen losartan-hydrochlorothiazide (HYZAAR) 50-12.5 MG tablet Take 0.5 tablets by mouth daily.     . traZODone (DESYREL) 150 MG tablet Take 150 mg by mouth at bedtime as needed for sleep.    . ARIPiprazole (ABILIFY) 5 MG tablet Take 1 tablet (5 mg total) by mouth daily. (Patient not taking: Reported on 08/27/2019) 30 tablet 0  . hydrOXYzine (ATARAX/VISTARIL) 50 MG tablet Take 1 tablet (50 mg total) by mouth every 6 (six) hours as needed for anxiety. (Patient not taking: Reported on 08/27/2019) 30 tablet 0  . traZODone (DESYREL) 100 MG tablet Take 1 tablet (100 mg total) by mouth at bedtime as needed for sleep. (Patient not taking: Reported on 08/27/2019) 30 tablet 0   PTA Medications: (Not in a hospital admission)   Musculoskeletal: Strength & Muscle Tone: within normal limits Gait & Station: normal Patient leans: N/A  Psychiatric Specialty Exam: Physical Exam Vitals and nursing note reviewed.  Constitutional:      Appearance: Normal appearance.  Pulmonary:     Effort: Pulmonary effort is normal.  Neurological:     Mental Status: She is alert.  Psychiatric:        Mood and Affect: Mood normal.        Behavior: Behavior normal.        Thought Content: Thought content normal.        Judgment: Judgment normal.     Review of Systems  Constitutional:  Negative for diaphoresis.  Neurological: Negative for tremors.  Psychiatric/Behavioral: Negative for agitation, behavioral problems, dysphoric mood, hallucinations, self-injury, sleep disturbance and suicidal ideas.    Blood pressure (!) 181/92, pulse 89, temperature 98.1 F (36.7 C), temperature source Oral, resp. rate 19, SpO2 94 %.There is no height or weight on file to calculate BMI.  General Appearance: Casual  Eye Contact:  Good  Speech:  Clear and Coherent and Normal Rate  Volume:  Normal  Mood:  " Better."  Appropriate  Affect:  Appropriate and Congruent  Thought Process:  Coherent, Goal Directed and Descriptions of Associations: Intact  Orientation:  Full (Time, Place, and Person)  Thought Content:  WDL  Suicidal Thoughts:  No  Homicidal Thoughts:  No  Memory:  Immediate;   Good Recent;   Good  Judgement:  Intact  Insight:  Present  Psychomotor Activity:  Normal  Concentration:  Concentration: Good and Attention Span: Good  Recall:  Good  Fund of Knowledge:  Good  Language:  Good  Akathisia:  No  Handed:  Right  AIMS (if indicated):     Assets:  Communication Skills Desire for Improvement Resilience  ADL's:  Intact  Cognition:  WNL  Sleep:        Demographic Factors:  Caucasian, Low socioeconomic status, Living alone and Unemployed  Loss Factors: Financial problems/change in socioeconomic status  Historical Factors: NA  Risk Reduction Factors:   Religious beliefs about death and Positive social support  Continued Clinical Symptoms:  Alcohol/Substance Abuse/Dependencies Previous Psychiatric Diagnoses and Treatments  Cognitive Features That Contribute To Risk:  None    Suicide Risk:  Minimal: No identifiable suicidal ideation.  Patients presenting with no risk factors but with morbid ruminations; may  be classified as minimal risk based on the severity of the depressive symptoms    Plan Of Care/Follow-up recommendations:  Activity:  As  tolerated Diet:  Heart healthy  Follow-up [facility] Schedule an appointment as soon as possible for a visit.   Specialty: Professional Counselor Why: Walk in Mon-Friday Contact information: [facility] [id number] [phone]          Disposition: Patient psychiatrically cleared No evidence of imminent risk to self or others at present.   Patient does not meet criteria for psychiatric inpatient admission. Supportive therapy provided about ongoing stressors. Refer to IOP. Discussed crisis plan, support from social network, calling 911, coming to the Emergency Department, and calling Suicide Hotline.  Aristotle Lieb, NP 08/28/2019, 10:31 AM

## 2019-08-28 NOTE — BH Assessment (Signed)
[facility] Assessment Progress Note  Per Hampton Abbot, MD, this pt does not require psychiatric hospitalization at this time.  Pt is to be discharged from [facility] with recommendation to follow up with [facility] [place].  This has been included in pt's discharge instructions.  Pt would also benefit from seeing Peer Support Specialists; they will be asked to speak to pt.  Pt's nurse, Nena Jordan, has been notified.  Jalene Mullet, [place] Triage Specialist [phone]

## 2020-10-30 ENCOUNTER — Ambulatory Visit (INDEPENDENT_AMBULATORY_CARE_PROVIDER_SITE_OTHER): Payer: 59 | Admitting: Pulmonary Disease

## 2020-10-30 ENCOUNTER — Encounter: Payer: Self-pay | Admitting: Pulmonary Disease

## 2020-10-30 ENCOUNTER — Other Ambulatory Visit: Payer: Self-pay

## 2020-10-30 ENCOUNTER — Other Ambulatory Visit (HOSPITAL_COMMUNITY)
Admission: RE | Admit: 2020-10-30 | Discharge: 2020-10-30 | Disposition: A | Discharge status: Home / Self Care | Payer: 59 | Source: Ambulatory Visit | Attending: Pulmonary Disease | Admitting: Pulmonary Disease

## 2020-10-30 ENCOUNTER — Other Ambulatory Visit (HOSPITAL_COMMUNITY): Payer: 59

## 2020-10-30 VITALS — BP 136/88 | HR 87 | Temp 98.4°F | Ht 66.0 in | Wt 148.2 lb

## 2020-10-30 DIAGNOSIS — Z20822 Contact with and (suspected) exposure to covid-19: Secondary | ICD-10-CM | POA: Diagnosis present

## 2020-10-30 DIAGNOSIS — R059 Cough, unspecified: Secondary | ICD-10-CM | POA: Diagnosis not present

## 2020-10-30 DIAGNOSIS — R0600 Dyspnea, unspecified: Secondary | ICD-10-CM

## 2020-10-30 DIAGNOSIS — R0609 Other forms of dyspnea: Secondary | ICD-10-CM

## 2020-10-30 LAB — SARS CORONAVIRUS 2 (TAT 6-24 HRS): SARS Coronavirus 2: NEGATIVE

## 2020-10-30 MED ORDER — ANORO ELLIPTA 62.5-25 MCG/INH IN AEPB: 1.0000 | INHALATION_SPRAY | Freq: Every day | RESPIRATORY_TRACT | 0 refills | Status: DC

## 2020-10-30 NOTE — Progress Notes (Signed)
'@Patient'  ID: Dorothy [name], female    DOB: [birth year], 65 y.o.   MRN: [id number]  Chief Complaint  Patient presents with  . Consult    Referred by PCP Dr. Inda Merlin for breathalyzer test. States she attempted to get it placed on her car but she can not blow out hard enough. States prior to this, she has had increased SOB and coughing. Still smokes.     Referring provider: [name], PA  HPI:   65 year old whom we are seeing for evaluation of concern for expiratory flow issues with breathalyzer on cognition.  Most recent PCP note prompting referral reviewed.  She states that the URI summer 2021.  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  She states that she was unable to adequately perform maneuvers for this.  She states that sometimes she would blow to hard, sometimes both to slow.  She reports about 15 minutes of training.  No further training.  She was sent to [facility] care doctor to further evaluate this and to be seen by a lung doctor to evaluate for breathing test.  She is a current smoker, significant nearly 50-year smoking history.  Chronic cough productive of phlegm, worse in the morning, at times there they were better or worse otherwise.  No seasonal changes.  No environmental changes.  Also mild dyspnea exertion.  Prolonged services over relatively long distances.  Worse with inclines or stairs.  Overall relatively stable, maybe mildly worsened over the last couple of years.  Most recent chest imaging 11/2015 reviewed on my interpretation with clear lungs.  PMH: Alcohol abuse, hypertension Surgical history: Elbow surgery, breast augmentation, skin cancer resection right side of nose Family history: Father with cirrhosis, denies significant respiratory issues in first-degree relatives Social history: Current smoker, 60+ pack year history, lives in [place]  ACT:  No flowsheet data found.  MMRC: No flowsheet data found.  Epworth:  No  flowsheet data found.  Tests:   FENO:  No results found for: NITRICOXIDE  PFT: No flowsheet data found.  WALK:  No flowsheet data found.  Imaging: Personally reviewed as per EMR discussion of this note.  Lab Results: Personally reviewed CBC    Component Value Date/Time   WBC 6.6 08/27/2019 1817   RBC 4.60 08/27/2019 1817   HGB 15.0 08/27/2019 1817   HCT 44.6 08/27/2019 1817   PLT 242 08/27/2019 1817   MCV 97.0 08/27/2019 1817   MCH 32.6 08/27/2019 1817   MCHC 33.6 08/27/2019 1817   RDW 15.0 08/27/2019 1817    BMET    Component Value Date/Time   NA 140 08/27/2019 1817   K 4.0 08/27/2019 1817   CL 101 08/27/2019 1817   CO2 26 08/27/2019 1817   GLUCOSE 78 08/27/2019 1817   BUN 9 08/27/2019 1817   CREATININE 0.37 (L) 08/27/2019 1817   CALCIUM 8.8 (L) 08/27/2019 1817   GFRNONAA >60 08/27/2019 1817   GFRAA >60 08/27/2019 1817    BNP No results found for: BNP  ProBNP No results found for: PROBNP  Specialty Problems   None     No Known Allergies  There is no immunization history for the selected administration types on file for this patient.  Past Medical History:  Diagnosis Date  . Alcoholic ([place])   . Chronic bronchitis (HCC)    Still smoking  . Depression   . GERD (gastroesophageal reflux disease)   . Hoarseness 06/2010   Oral thrush---- Dr. Melida Quitter  .  Hypertension   . Insomnia   . Malignant melanoma ([place]) 2009   Removed from left shoulder    Tobacco History: Social History   Tobacco Use  Smoking Status Current Every Day Smoker  . Packs/day: 1.50  . Types: Cigarettes  . Start date: 08/24/1972  Smokeless Tobacco Never Used   Ready to quit: Not Answered Counseling given: Not Answered   Advised absence of smoking, plans to revisit with hypnotist which has been successful in the past  Outpatient Encounter Medications as of 10/30/2020  Medication Sig  . amLODipine (NORVASC) 5 MG tablet Take 5 mg by mouth daily.  Marland Kitchen escitalopram  (LEXAPRO) 20 MG tablet Take 20 mg by mouth daily.  . fluocinonide (LIDEX) 0.05 % external solution Apply topically.  Marland Kitchen FLUoxetine (PROZAC) 20 MG capsule Take 40 mg by mouth every morning.  . traZODone (DESYREL) 100 MG tablet trazodone 100 mg tablet  Take 1.5 tablets every day by oral route at bedtime.  . [DISCONTINUED] amLODipine (NORVASC) 5 MG tablet Take 5 mg by mouth daily.  . [DISCONTINUED] ARIPiprazole (ABILIFY) 5 MG tablet Take 1 tablet (5 mg total) by mouth daily. (Patient not taking: Reported on 08/27/2019)  . [DISCONTINUED] FLUoxetine (PROZAC) 20 MG tablet Take 20 mg by mouth daily.  . [DISCONTINUED] hydrOXYzine (ATARAX/VISTARIL) 50 MG tablet Take 1 tablet (50 mg total) by mouth every 6 (six) hours as needed for anxiety. (Patient not taking: Reported on 08/27/2019)  . [DISCONTINUED] losartan-hydrochlorothiazide (HYZAAR) 50-12.5 MG tablet Take 0.5 tablets by mouth daily.   . [DISCONTINUED] traZODone (DESYREL) 100 MG tablet Take 1 tablet (100 mg total) by mouth at bedtime as needed for sleep. (Patient not taking: Reported on 08/27/2019)  . [DISCONTINUED] traZODone (DESYREL) 150 MG tablet Take 150 mg by mouth at bedtime as needed for sleep.   No facility-administered encounter medications on file as of 10/30/2020.     Review of Systems  Review of Systems  No chest pain with exertion.  No orthopnea or PND.  No lower extremity swelling.  Comprehensive review of systems otherwise negative. Physical Exam  BP 136/88   Pulse 87   Temp 98.4 F (36.9 C) (Temporal)   Ht '5\' 6"'  (1.676 m)   Wt 148 lb 3.2 oz (67.2 kg)   SpO2 95% Comment: on RA  BMI 23.92 kg/m   Wt Readings from Last 5 Encounters:  10/30/20 148 lb 3.2 oz (67.2 kg)  08/21/19 129 lb (58.5 kg)  06/06/18 130 lb (59 kg)  01/23/17 145 lb (65.8 kg)    BMI Readings from Last 5 Encounters:  10/30/20 23.92 kg/m  08/21/19 20.82 kg/m  06/06/18 19.20 kg/m  01/23/17 23.40 kg/m     Physical Exam General: Well-appearing, sitting  in exam chair Eyes: EOMI, no icterus Neck: Supple, JVP appreciate Respiratory: Clear to auscultate bilaterally, no wheeze or crackle Cardiovascular: Regular rate and rhythm, no murmur appreciated Abdomen: Nondistended, bowel sounds present MSK: No synovitis, no joint effusion neuro: normal gait, no weakness psych: normal mood, full affect    Assessment & Plan:   Issue with breathalyzer on car ignition: Per report, sometimes blows too hard, sometimes to slow.  Sounds like a coaching issue given able to generate expiratory force that is reportedly too high.  Will obtain PFTs for further evaluation.  Not sure how much more helpful I can be, likely would benefit from longer coaching sessions with the mechanism needed to start her car.  Dyspnea on exertion, chronic cough: Suspect chronic bronchitis in the setting  of 60+ more pack-year history.  Trial of Anoro.  If beneficial, happy to provide refills.   Return if symptoms worsen or fail to improve.  If PFTs are normal, no need for follow-up unless patient wishes to continue work-up for shortness of breath, cough.  If COPD present, would recommend 8-monthfollow-up to check in on symptoms etc.   MLanier Clam MD 10/30/2020

## 2020-10-30 NOTE — Patient Instructions (Addendum)
Nice to meet you  We will get breathing test scheduled in the coming days so we can get more information to help with the blowing issue.  The breathing test will also tell us of COPD is present or not.  I provided some samples for Anoro.  Please use this 1 puff daily to see if it helps with your shortness of breath, cough.  If it is beneficial, we can send in a new prescription for you to use every day.  This will help especially if there is COPD present.  PFTs next available  Follow-up pending results, if breathing tests are normal I will not schedule a follow-up visit but am happy to see you anytime as needed.  If COPD is present, I would recommend we touch base after a few months to see how your symptoms are doing.

## 2020-10-30 NOTE — Progress Notes (Signed)
Patient seen in the office today and instructed on use of Anoro.  Patient expressed understanding and demonstrated technique.  [facility] 10/30/20

## 2020-10-30 NOTE — Addendum Note (Signed)
Addended by: Valerie Salts on: 10/30/2020 11:30 AM   Modules accepted: Orders

## 2020-11-01 ENCOUNTER — Other Ambulatory Visit: Payer: Self-pay

## 2020-11-01 ENCOUNTER — Ambulatory Visit (INDEPENDENT_AMBULATORY_CARE_PROVIDER_SITE_OTHER): Payer: 59 | Admitting: Pulmonary Disease

## 2020-11-01 DIAGNOSIS — R0600 Dyspnea, unspecified: Secondary | ICD-10-CM

## 2020-11-01 DIAGNOSIS — R0609 Other forms of dyspnea: Secondary | ICD-10-CM

## 2020-11-01 LAB — PULMONARY FUNCTION TEST
DL/VA % pred: 78 %
DL/VA: 3.27 ml/min/mmHg/L
DLCO cor % pred: 85 %
DLCO cor: 18.39 ml/min/mmHg
DLCO unc % pred: 85 %
DLCO unc: 18.39 ml/min/mmHg
FEF 25-75 Post: 2.32 L/sec
FEF 25-75 Pre: 1.98 L/sec
FEF2575-%Change-Post: 16 %
FEF2575-%Pred-Post: 97 %
FEF2575-%Pred-Pre: 83 %
FEV1-%Change-Post: 6 %
FEV1-%Pred-Post: 102 %
FEV1-%Pred-Pre: 96 %
FEV1-Post: 2.77 L
FEV1-Pre: 2.59 L
FEV1FVC-%Change-Post: 6 %
FEV1FVC-%Pred-Pre: 92 %
FEV6-%Change-Post: 0 %
FEV6-%Pred-Post: 107 %
FEV6-%Pred-Pre: 107 %
FEV6-Post: 3.61 L
FEV6-Pre: 3.62 L
FEV6FVC-%Pred-Post: 103 %
FEV6FVC-%Pred-Pre: 103 %
FVC-%Change-Post: 0 %
FVC-%Pred-Post: 103 %
FVC-%Pred-Pre: 103 %
FVC-Post: 3.61 L
FVC-Pre: 3.62 L
Post FEV1/FVC ratio: 77 %
Post FEV6/FVC ratio: 100 %
Pre FEV1/FVC ratio: 72 %
Pre FEV6/FVC Ratio: 100 %
RV % pred: 88 %
RV: 1.88 L
TLC % pred: 104 %
TLC: 5.58 L

## 2020-11-01 NOTE — Patient Instructions (Signed)
Pulmonary function test performed today. 

## 2020-11-01 NOTE — Progress Notes (Signed)
Full Pulmonary function test performed today. 

## 2020-11-08 ENCOUNTER — Telehealth: Payer: Self-pay | Admitting: Pulmonary Disease

## 2020-11-08 NOTE — Telephone Encounter (Signed)
Spoke with pt and informed her that PFT results had not been reviewed. Pt ask for PFTs to be resulted quickly as she has to have a "blow and go" installed on car and needs results for that to be done. Dr. Silas Flood please provide results on PFT preformed on 10/30/20.

## 2020-11-08 NOTE — Telephone Encounter (Signed)
ATC LVMTC X1  ?

## 2020-11-11 NOTE — Telephone Encounter (Signed)
Called patient but she did not answer. Left message for her to call us back.  

## 2020-11-12 NOTE — Telephone Encounter (Signed)
Lmtcb for pt.  

## 2020-11-15 NOTE — Telephone Encounter (Signed)
Pt returning a phone call. Pt can be reached at [phone]

## 2020-11-15 NOTE — Telephone Encounter (Signed)
Called and spoke with pt letting her know the results of PFT and info stated by [facility]. Pt verbalized understanding. Nothing further needed.

## 2021-10-15 ENCOUNTER — Observation Stay (HOSPITAL_COMMUNITY)
Admission: EM | Admit: 2021-10-15 | Discharge: 2021-10-16 | Disposition: A | Discharge status: Home / Self Care | Payer: Self-pay | Attending: Internal Medicine | Admitting: Internal Medicine

## 2021-10-15 ENCOUNTER — Encounter (HOSPITAL_COMMUNITY): Payer: Self-pay

## 2021-10-15 ENCOUNTER — Other Ambulatory Visit: Payer: Self-pay

## 2021-10-15 ENCOUNTER — Emergency Department (HOSPITAL_COMMUNITY): Payer: Self-pay

## 2021-10-15 DIAGNOSIS — Z7982 Long term (current) use of aspirin: Secondary | ICD-10-CM | POA: Insufficient documentation

## 2021-10-15 DIAGNOSIS — F331 Major depressive disorder, recurrent, moderate: Secondary | ICD-10-CM | POA: Diagnosis present

## 2021-10-15 DIAGNOSIS — Z85828 Personal history of other malignant neoplasm of skin: Secondary | ICD-10-CM | POA: Insufficient documentation

## 2021-10-15 DIAGNOSIS — J42 Unspecified chronic bronchitis: Secondary | ICD-10-CM | POA: Diagnosis present

## 2021-10-15 DIAGNOSIS — E785 Hyperlipidemia, unspecified: Secondary | ICD-10-CM | POA: Diagnosis present

## 2021-10-15 DIAGNOSIS — Z20822 Contact with and (suspected) exposure to covid-19: Secondary | ICD-10-CM | POA: Insufficient documentation

## 2021-10-15 DIAGNOSIS — G453 Amaurosis fugax: Principal | ICD-10-CM | POA: Insufficient documentation

## 2021-10-15 DIAGNOSIS — F332 Major depressive disorder, recurrent severe without psychotic features: Secondary | ICD-10-CM | POA: Diagnosis present

## 2021-10-15 DIAGNOSIS — F17210 Nicotine dependence, cigarettes, uncomplicated: Secondary | ICD-10-CM | POA: Insufficient documentation

## 2021-10-15 DIAGNOSIS — F1020 Alcohol dependence, uncomplicated: Secondary | ICD-10-CM | POA: Diagnosis present

## 2021-10-15 DIAGNOSIS — F32A Depression, unspecified: Secondary | ICD-10-CM | POA: Insufficient documentation

## 2021-10-15 DIAGNOSIS — I10 Essential (primary) hypertension: Secondary | ICD-10-CM | POA: Diagnosis present

## 2021-10-15 DIAGNOSIS — Z79899 Other long term (current) drug therapy: Secondary | ICD-10-CM | POA: Insufficient documentation

## 2021-10-15 DIAGNOSIS — G459 Transient cerebral ischemic attack, unspecified: Secondary | ICD-10-CM | POA: Diagnosis present

## 2021-10-15 LAB — RAPID URINE DRUG SCREEN, HOSP PERFORMED
Amphetamines: NOT DETECTED
Barbiturates: NOT DETECTED
Benzodiazepines: NOT DETECTED
Cocaine: NOT DETECTED
Opiates: NOT DETECTED
Tetrahydrocannabinol: NOT DETECTED

## 2021-10-15 LAB — CBC WITH DIFFERENTIAL/PLATELET
Abs Immature Granulocytes: 0.03 10*3/uL (ref 0.00–0.07)
Basophils Absolute: 0 10*3/uL (ref 0.0–0.1)
Basophils Relative: 1 %
Eosinophils Absolute: 0.1 10*3/uL (ref 0.0–0.5)
Eosinophils Relative: 1 %
HCT: 42.8 % (ref 36.0–46.0)
Hemoglobin: 14.2 g/dL (ref 12.0–15.0)
Immature Granulocytes: 0 %
Lymphocytes Relative: 27 %
Lymphs Abs: 2.3 10*3/uL (ref 0.7–4.0)
MCH: 32.6 pg (ref 26.0–34.0)
MCHC: 33.2 g/dL (ref 30.0–36.0)
MCV: 98.2 fL (ref 80.0–100.0)
Monocytes Absolute: 0.7 10*3/uL (ref 0.1–1.0)
Monocytes Relative: 8 %
Neutro Abs: 5.4 10*3/uL (ref 1.7–7.7)
Neutrophils Relative %: 63 %
Platelets: 189 10*3/uL (ref 150–400)
RBC: 4.36 MIL/uL (ref 3.87–5.11)
RDW: 14 % (ref 11.5–15.5)
WBC: 8.6 10*3/uL (ref 4.0–10.5)
nRBC: 0 % (ref 0.0–0.2)

## 2021-10-15 LAB — COMPREHENSIVE METABOLIC PANEL
ALT: 16 U/L (ref 0–44)
AST: 21 U/L (ref 15–41)
Albumin: 3.8 g/dL (ref 3.5–5.0)
Alkaline Phosphatase: 61 U/L (ref 38–126)
Anion gap: 9 (ref 5–15)
BUN: 13 mg/dL (ref 8–23)
CO2: 24 mmol/L (ref 22–32)
Calcium: 9.2 mg/dL (ref 8.9–10.3)
Chloride: 103 mmol/L (ref 98–111)
Creatinine, Ser: 0.57 mg/dL (ref 0.44–1.00)
GFR, Estimated: >60 mL/min (ref >60)
Glucose, Bld: 92 mg/dL (ref 70–99)
Potassium: 4 mmol/L (ref 3.5–5.1)
Sodium: 136 mmol/L (ref 135–145)
Total Bilirubin: 0.6 mg/dL (ref 0.3–1.2)
Total Protein: 6.6 g/dL (ref 6.5–8.1)

## 2021-10-15 LAB — ETHANOL: Alcohol, Ethyl (B): <10 mg/dL (ref <10)

## 2021-10-15 MED ORDER — ACETAMINOPHEN 650 MG RE SUPP: 650.0000 mg | RECTAL | Status: DC | PRN

## 2021-10-15 MED ORDER — ACETAMINOPHEN 325 MG PO TABS
650.0000 mg | ORAL_TABLET | ORAL | Status: DC | PRN
  Administered 2021-10-16: 650 mg via ORAL
  Filled 2021-10-15: qty 2

## 2021-10-15 MED ORDER — ASPIRIN EC 81 MG PO TBEC
81.0000 mg | DELAYED_RELEASE_TABLET | Freq: Every day | ORAL | Status: DC
  Administered 2021-10-16: 81 mg via ORAL
  Filled 2021-10-15: qty 1

## 2021-10-15 MED ORDER — UMECLIDINIUM-VILANTEROL 62.5-25 MCG/ACT IN AEPB
1.0000 | INHALATION_SPRAY | Freq: Every day | RESPIRATORY_TRACT | Status: DC
  Filled 2021-10-15: qty 14

## 2021-10-15 MED ORDER — CLOPIDOGREL BISULFATE 75 MG PO TABS
75.0000 mg | ORAL_TABLET | Freq: Every day | ORAL | Status: DC
  Administered 2021-10-16: 75 mg via ORAL
  Filled 2021-10-15: qty 1

## 2021-10-15 MED ORDER — AMLODIPINE BESYLATE 5 MG PO TABS
5.0000 mg | ORAL_TABLET | Freq: Every day | ORAL | Status: DC
Start: 2021-10-16 — End: 2021-10-15

## 2021-10-15 MED ORDER — TRAZODONE HCL 50 MG PO TABS: 150.0000 mg | ORAL_TABLET | Freq: Every evening | ORAL | Status: DC | PRN

## 2021-10-15 MED ORDER — ATORVASTATIN CALCIUM 40 MG PO TABS
80.0000 mg | ORAL_TABLET | Freq: Every day | ORAL | Status: DC
  Administered 2021-10-16: 80 mg via ORAL
  Filled 2021-10-15: qty 2

## 2021-10-15 MED ORDER — IOHEXOL 350 MG/ML SOLN
65.0000 mL | Freq: Once | INTRAVENOUS | Status: AC | PRN
  Administered 2021-10-15: 65 mL via INTRAVENOUS

## 2021-10-15 MED ORDER — ENOXAPARIN SODIUM 40 MG/0.4ML IJ SOSY
40.0000 mg | PREFILLED_SYRINGE | INTRAMUSCULAR | Status: DC
  Administered 2021-10-15: 40 mg via SUBCUTANEOUS
  Filled 2021-10-15: qty 0.4

## 2021-10-15 MED ORDER — ACETAMINOPHEN 160 MG/5ML PO SOLN: 650.0000 mg | ORAL | Status: DC | PRN

## 2021-10-15 MED ORDER — STROKE: EARLY STAGES OF RECOVERY BOOK
Freq: Once | Status: DC
  Filled 2021-10-15: qty 1

## 2021-10-15 NOTE — Consult Note (Signed)
Neurology Consultation  Reason for Consult: Visual changes Referring Physician: Dr. Billy Fischer  CC: Visual changes  History is obtained from: Patient, chart  HPI: Dorothy [name] is a 66 y.o. female past medical history of alcoholism, tobacco abuse, hypertension, depression, presented to the emergency room for evaluation of what she describes as altitudinal visual loss from the right eye.  This happened sometime this morning-she cannot tell me the exact time but lasted only for 1 minute.  She said that she started seeing some squiggly lines in the middle of the visual field and everything below that line was gray/blurred everything above was clear.  Lasted for about a minute.  She did feel some pressure behind the eye and pain in the eye but no florid headache. Has a history of hypertension-does not take medicines.  Has not seen her primary care doctor in many years.   LKW: Sometime this morning. tpa given?: no, symptoms resolved Premorbid modified Rankin scale (mRS): 0 ROS: Full ROS was performed and is negative except as noted in the HPI  Past Medical History:  Diagnosis Date   Alcoholic ([place])    Chronic bronchitis ([place])    Still smoking   Depression    GERD (gastroesophageal reflux disease)    Hoarseness 06/2010   Oral thrush---- Dr. Melida Quitter   Hypertension    Insomnia    Malignant melanoma ([place]) 2009   Removed from left shoulder   Family History  Problem Relation Age of Onset   Cirrhosis Father    Social History:   reports that she has been smoking cigarettes. She started smoking about 49 years ago. She has been smoking an average of 1.5 packs per day. She has never used smokeless tobacco. She reports current alcohol use. She reports current drug use. Drug: Marijuana.  Medications No current facility-administered medications for this encounter.  Current Outpatient Medications:    amLODipine (NORVASC) 5 MG tablet, Take 5 mg by mouth daily., Disp: , Rfl:     escitalopram (LEXAPRO) 20 MG tablet, Take 20 mg by mouth daily., Disp: , Rfl:    fluocinonide (LIDEX) 0.05 % external solution, Apply topically., Disp: , Rfl:    FLUoxetine (PROZAC) 20 MG capsule, Take 40 mg by mouth every morning., Disp: , Rfl:    traZODone (DESYREL) 100 MG tablet, trazodone 100 mg tablet  Take 1.5 tablets every day by oral route at bedtime., Disp: , Rfl:    umeclidinium-vilanterol (ANORO ELLIPTA) 62.5-25 MCG/INH AEPB, Inhale 1 puff into the lungs daily., Disp: 2 each, Rfl: 0  Exam: Current vital signs: BP (!) 176/93 (BP Location: Right Arm)    Pulse 71    Temp 98.9 F (37.2 C) (Oral)    Resp 17    Ht 5\' 6"  (1.676 m)    Wt 59 kg    SpO2 95%    BMI 20.98 kg/m  Vital signs in last 24 hours: Temp:  [98.9 F (37.2 C)] 98.9 F (37.2 C) (02/22 1525) Pulse Rate:  [62-91] 71 (02/22 1919) Resp:  [17-18] 17 (02/22 1919) BP: (153-176)/(93-98) 176/93 (02/22 1919) SpO2:  [95 %-99 %] 95 % (02/22 1919) Weight:  [59 kg] 59 kg (02/22 1528)  GENERAL: Awake, alert in NAD HEENT: - Normocephalic and atraumatic, dry mm, no LN++, no Thyromegally LUNGS - Clear to auscultation bilaterally with no wheezes CV - S1S2 RRR, no m/r/g, equal pulses bilaterally. ABDOMEN - Soft, nontender, nondistended with normoactive BS Ext: warm, well perfused, intact peripheral pulses, no edema-has a right  ankle bracelet for alcohol monitoring  NEURO:  Mental Status: AA&Ox3  Language: speech is nondysarthric.  Naming, repetition, fluency, and comprehension intact. Cranial Nerves: PERRLEOMI, visual fields full, no facial asymmetry, facial sensation intact, hearing intact, tongue/uvula/soft palate midline, normal sternocleidomastoid and trapezius muscle strength. No evidence of tongue atrophy or fibrillations Motor: No drift in any of the 4 extremities Tone: is normal and bulk is normal Sensation- Intact to light touch bilaterally Coordination: FTN intact bilaterally, no ataxia in BLE. Gait-  deferred  NIHSS--0   Labs I have reviewed labs in epic and the results pertinent to this consultation are:  CBC    Component Value Date/Time   WBC 8.6 10/15/2021 1916   RBC 4.36 10/15/2021 1916   HGB 14.2 10/15/2021 1916   HCT 42.8 10/15/2021 1916   PLT 189 10/15/2021 1916   MCV 98.2 10/15/2021 1916   MCH 32.6 10/15/2021 1916   MCHC 33.2 10/15/2021 1916   RDW 14.0 10/15/2021 1916   LYMPHSABS 2.3 10/15/2021 1916   MONOABS 0.7 10/15/2021 1916   EOSABS 0.1 10/15/2021 1916   BASOSABS 0.0 10/15/2021 1916    CMP     Component Value Date/Time   NA 136 10/15/2021 1916   K 4.0 10/15/2021 1916   CL 103 10/15/2021 1916   CO2 24 10/15/2021 1916   GLUCOSE 92 10/15/2021 1916   BUN 13 10/15/2021 1916   CREATININE 0.57 10/15/2021 1916   CALCIUM 9.2 10/15/2021 1916   PROT 6.6 10/15/2021 1916   ALBUMIN 3.8 10/15/2021 1916   AST 21 10/15/2021 1916   ALT 16 10/15/2021 1916   ALKPHOS 61 10/15/2021 1916   BILITOT 0.6 10/15/2021 1916   GFRNONAA >60 10/15/2021 1916   GFRAA >60 08/27/2019 1817   Imaging I have reviewed the images obtained:  CT-head-done as a part of CT angiography head and neck-full study pending.  Preliminary review of CT head with no acute changes-question pontine hypodensity centered close to midline preferring the left pons-consistent with old stroke versus changes of chronic hypertension.  Assessment: 66 year old with above past medical history presenting for transient episode of altitudinal visual defect which was monocular.  Concern for right-sided carotid stenosis versus right-sided BRA O/CRAO like etiology. Will benefit from stroke/TIA risk factor work-up.  Impression: -Question amaurosis fugax versus transient altitudinal visual defect from branch retinal artery occlusion -Evaluate for stroke/TIA  Recommendations: Admit to hospitalist MRI brain without contrast CTA head and neck Frequent neurochecks Telemetry Aspirin 81 and Plavix 75 for 3 weeks High  intensity statin 2D echo A1c Lipid panel PT OT Speech therapy Stroke team to follow. Plan discussed with Dr. Billy Fischer in person in the emergency room  -- Amie [place], MD Neurologist [facility] Pager: [phone]

## 2021-10-15 NOTE — ED Triage Notes (Signed)
Pt arrived POV from home c/o vision changes. Pt stated this morning she had some pressure behind her right eye then a line went through her vision and under the line everything was grey and above the line her vision was fine. Pt states it resolved but the eye doctor told her to come here.

## 2021-10-15 NOTE — ED Provider Triage Note (Signed)
Emergency Medicine Provider Triage Evaluation Note  Dorothy [name] , a 66 y.o. female  was evaluated in triage.  Pt complains of visual disturbance.  Patient reports that today approximately 8:30 AM she saw a line appear across the vision of her right eye and then she developed vision loss below that line.  Patient describes vision loss as a " curtain."  This lasted approximately 1 minute before completely resolving.  Patient also reported feeling pressure behind her right eye at that time as well.  Denies any numbness, weakness, facial asymmetry, dysarthria.  Patient does not wear any contacts.  Review of Systems  Positive: Visual changes Negative: See above  Physical Exam  BP (!) 153/98 (BP Location: Right Arm)    Pulse 91    Temp 98.9 F (37.2 C) (Oral)    Resp 18    Ht 5\' 6"  (1.676 m)    Wt 59 kg    SpO2 99%    BMI 20.98 kg/m  Gen:   Awake, no distress   Resp:  Normal effort  MSK:   Moves extremities without difficulty  Other:  EOM intact bilaterally.  Pupils PERRL.  Medical Decision Making  Medically screening exam initiated at 3:34 PM.  Appropriate orders placed.  SHANTIKA BERMEA was informed that the remainder of the evaluation will be completed by another provider, this initial triage assessment does not replace that evaluation, and the importance of remaining in the ED until their evaluation is complete.     Loni Beckwith, [place] 10/15/21 1536

## 2021-10-15 NOTE — ED Notes (Signed)
Pt applied 21mg  nicotine patch to right shoulder while waiting in the ED at approximately 1900.

## 2021-10-15 NOTE — H&P (Signed)
History and Physical   [name] XTG:[id number] DOB: 1955-11-18 DOA: 10/15/2021  PCP: Dorothy Huddle, MD   Patient coming from: Home  Chief Complaint: Vision changes  HPI: Dorothy [name] is a 66 y.o. female with medical history significant of depression, alcohol use, bronchitis, GERD, hypertension who presents with abnormal vision changes.  Patient states that this occurred in her right eye earlier this morning and lasted about a minute.  She describes episode of squiggly lines appearing in the middle of her vision with all the vision below the lines blurry or gray in all the vision above the lines clear.  She also reports some pressure sensation behind the eye.  She denies fevers, chills, chest pain, shortness of breath, abdominal pain, constipation, diarrhea, nausea, vomiting, numbness, weakness.  ED Course: Vital signs in the ED significant for blood pressure in the 270J to 500X systolic.  Lab work-up showed CMP within normal limits.  CBC within normal limits.  Ethanol level negative.  UDS negative.  CTA of the head and neck is pending and MRI brain has been ordered but is delayed.  Neurology was consulted in the ED and recommended stroke work-up.  Review of Systems: As per HPI otherwise all other systems reviewed and are negative.  Past Medical History:  Diagnosis Date   Alcoholic ([place])    Chronic bronchitis ([place])    Still smoking   Depression    GERD (gastroesophageal reflux disease)    Hoarseness 06/2010   Oral thrush---- Dr. Melida [name]   Hypertension    Insomnia    Malignant melanoma ([place]) 2009   Removed from left shoulder    Past Surgical History:  Procedure Laterality Date   BASAL CELL CARCINOMA EXCISION  2012   Right side nose    BREAST ENHANCEMENT SURGERY  2002   ELBOW SURGERY     Right elbow surgery---Ruptured tendon    Social History  reports that she has been smoking cigarettes. She started smoking about 49 years ago. She has been smoking an  average of 1.5 packs per day. She has never used smokeless tobacco. She reports current alcohol use. She reports current drug use. Drug: Marijuana.  No Known Allergies  Family History  Problem Relation Age of Onset   Cirrhosis Father   Reviewed on admission  Prior to Admission medications   Medication Sig Start Date End Date Taking? Authorizing Provider  amLODipine (NORVASC) 5 MG tablet Take 5 mg by mouth daily.    [provider]  escitalopram (LEXAPRO) 20 MG tablet Take 20 mg by mouth daily.    [provider]  fluocinonide (LIDEX) 0.05 % external solution Apply topically. 07/26/20   [provider]  FLUoxetine (PROZAC) 20 MG capsule Take 40 mg by mouth every morning. 08/23/20   [provider]  traZODone (DESYREL) 100 MG tablet trazodone 100 mg tablet  Take 1.5 tablets every day by oral route at bedtime.    [provider]  umeclidinium-vilanterol (ANORO ELLIPTA) 62.5-25 MCG/INH AEPB Inhale 1 puff into the lungs daily. 10/30/20   [name], Dorothy Gains, MD    Physical Exam: Vitals:   10/15/21 1805 10/15/21 1919 10/15/21 2100 10/15/21 2145  BP:  (!) 176/93 133/72 (!) 162/82  Pulse: 62 71 61 62  Resp:  17  15  Temp:      TempSrc:      SpO2: 95% 95% 96% 93%  Weight:      Height:        Physical Exam  Constitutional:      General: She is not in acute distress.    Appearance: Normal appearance.  HENT:     Head: Normocephalic and atraumatic.     Mouth/Throat:     Mouth: Mucous membranes are moist.     Pharynx: Oropharynx is clear.  Eyes:     Extraocular Movements: Extraocular movements intact.     Pupils: Pupils are equal, round, and reactive to light.  Cardiovascular:     Rate and Rhythm: Normal rate and regular rhythm.     Pulses: Normal pulses.     Heart sounds: Normal heart sounds.  Pulmonary:     Effort: Pulmonary effort is normal. No respiratory distress.     Breath sounds: Normal breath sounds.  Abdominal:     General:  Bowel sounds are normal. There is no distension.     Palpations: Abdomen is soft.     Tenderness: There is no abdominal tenderness.  Musculoskeletal:        General: No swelling or deformity.  Skin:    General: Skin is warm and dry.  Neurological:     Comments: Mental Status: Patient is awake, alert, oriented x3 No signs of aphasia or neglect Cranial Nerves: II: Pupils equal, round, and reactive to light.   III,IV, VI: EOMI without ptosis or diploplia.  V: Facial sensation is symmetric to light touch and  Temperature. VII: Facial movement is symmetric.  VIII: hearing is intact to voice X: Uvula elevates symmetrically XI: Shoulder shrug is symmetric. XII: tongue is midline without atrophy or fasciculations.  Motor: Good effort thorughout, at Least 5/5 bilateral UE, 5/5 bilateral lower extremitiy  Sensory: Sensation is grossly intact bilateral UEs & LEs   Labs on Admission: I have personally reviewed following labs and imaging studies  CBC: Recent Labs  Lab 10/15/21 1916  WBC 8.6  NEUTROABS 5.4  HGB 14.2  HCT 42.8  MCV 98.2  PLT 182    Basic Metabolic Panel: Recent Labs  Lab 10/15/21 1916  NA 136  K 4.0  CL 103  CO2 24  GLUCOSE 92  BUN 13  CREATININE 0.57  CALCIUM 9.2    GFR: Estimated Creatinine Clearance: 67 mL/min (by C-G formula based on SCr of 0.57 mg/dL).  Liver Function Tests: Recent Labs  Lab 10/15/21 1916  AST 21  ALT 16  ALKPHOS 61  BILITOT 0.6  PROT 6.6  ALBUMIN 3.8    Urine analysis: No results found for: COLORURINE, APPEARANCEUR, LABSPEC, PHURINE, GLUCOSEU, HGBUR, BILIRUBINUR, KETONESUR, PROTEINUR, UROBILINOGEN, NITRITE, LEUKOCYTESUR  Radiological Exams on Admission: CT ANGIO HEAD NECK W WO CM  Result Date: 10/15/2021 CLINICAL DATA:  Visual changes stroke suspected EXAM: CT ANGIOGRAPHY HEAD AND NECK TECHNIQUE: Multidetector CT imaging of the head and neck was performed using the standard protocol during bolus administration of  intravenous contrast. Multiplanar CT image reconstructions and MIPs were obtained to evaluate the vascular anatomy. Carotid stenosis measurements (when applicable) are obtained utilizing NASCET criteria, using the distal internal carotid diameter as the denominator. RADIATION DOSE REDUCTION: This exam was performed according to the departmental dose-optimization program which includes automated exposure control, adjustment of the mA and/or kV according to patient size and/or use of iterative reconstruction technique. CONTRAST:  49mL OMNIPAQUE IOHEXOL 350 MG/ML SOLN COMPARISON:  None. FINDINGS: CT HEAD FINDINGS Brain: No evidence of acute infarction, hemorrhage, cerebral edema, mass, mass effect, or midline shift. No hydrocephalus or extra-axial fluid collection. Vascular: No hyperdense vessel. Skull: Normal. Negative for fracture or focal lesion.  Sinuses/Orbits: Mucous retention cyst in the right maxillary sinus. Otherwise negative. The orbits are unremarkable. Other: The mastoid air cells are well aerated. CTA NECK FINDINGS Aortic arch: Standard branching. Imaged portion shows no evidence of aneurysm or dissection. No significant stenosis of the major arch vessel origins. Aortic atherosclerosis, which extends into the origins of the branch vessels. Right carotid system: No evidence of dissection or occlusion. Approximately 70% narrowing at the origin of the right external carotid artery. Calcifications in the common carotid, at the bifurcation and in the proximal right ICA are not hemodynamically significant. Left carotid system: No evidence of dissection, stenosis (50% or greater) or occlusion. Calcifications in the left common carotid artery, at bifurcation, and in the left ICA and ECA are not hemodynamically significant. Vertebral arteries: Codominant. No evidence of dissection, stenosis (50% or greater) or occlusion. Skeleton: No acute osseous abnormality. Other neck: Negative. Upper chest: Centrilobular and  paraseptal emphysema. No focal pulmonary opacity or pleural effusion. Review of the MIP images confirms the above findings CTA HEAD Anterior circulation: Both internal carotid arteries are patent to the termini, with mild-to-moderate calcification but without significant stenosis. A1 segments patent. Normal anterior communicating artery. Anterior cerebral arteries are patent to their distal aspects. No M1 stenosis or occlusion. Normal MCA bifurcations. Distal MCA branches perfused and symmetric. Posterior circulation: Vertebral arteries patent to the vertebrobasilar junction without stenosis. Posterior inferior cerebral arteries patent bilaterally. Basilar patent to its distal aspect. Superior cerebellar arteries patent bilaterally. Patent P1 segments. PCAs perfused to their distal aspects without stenosis. The bilateral posterior communicating arteries are not visualized. Venous sinuses: As permitted by contrast timing, patent. Anatomic variants: None significant. Review of the MIP images confirms the above findings IMPRESSION: 1.  No acute intracranial process. 2.  No intracranial large vessel occlusion or significant stenosis. 3. Approximately 70 percent narrowing at the origin of the right ECA. No other hemodynamically significant stenosis in the neck. Electronically Signed   By: Merilyn Baba M.D.   On: 10/15/2021 21:51    EKG: Ordered in ED, but not yet performed  Assessment/Plan Principal Problem:   TIA (transient ischemic attack) Active Problems:   Severe recurrent major depression without psychotic features (HCC)   HTN (hypertension)   Chronic bronchitis (HCC)   Suspected TIA Vision changes > Patient presenting after visual field changes in her right eye that lasted about a minute earlier this morning.  Consisting of squiggly lines across the middle of her vision with clear vision above and blurry vision below. > Neurology has seen and evaluated the patient and recommended stroke work-up.   Consideration for amaurosis fugax versus altitudinal visual loss. - Neurology consult - Allow for permissive HTN (systolic < 829 and diastolic < 937)  - ASA 81 mg daily and Plavix 75 mg daily per neurology - Start Atorvastatin - Echocardiogram  - Follow-up CTA head & neck  - Follow-up MRI, has been delayed due to monitoring device on ankle - A1C  - Lipid panel  - Tele monitoring  - SLP eval - PT/OT  Hypertension > Hypertensive in the ED.  Not currently taking home medications. -We will not restart medications due to permissive hypertension  History of chronic bronchitis -Has not been taking her previously prescribed inhaler, does not feel it helps.  Depression - Not currently on any medications - Requesting to resume home trazodone due to issues with sleep, this has been reordered  DVT prophylaxis: Lovenox Code Status:   Full Family Communication:  None on admission Disposition  Plan:   Patient is from:  Home  Anticipated DC to:  Home  Anticipated DC date:  1 to 2 days  Anticipated DC barriers: None  Consults called:  Neurology, consulted by EDP Admission status:  Observation, telemetry  Severity of Illness: The appropriate patient status for this patient is OBSERVATION. Observation status is judged to be reasonable and necessary in order to provide the required intensity of service to ensure the patient's safety. The patient's presenting symptoms, physical exam findings, and initial radiographic and laboratory data in the context of their medical condition is felt to place them at decreased risk for further clinical deterioration. Furthermore, it is anticipated that the patient will be medically stable for discharge from the hospital within 2 midnights of admission.    Marcelyn Bruins MD [facility]  How to contact the [facility] Attending or Consulting provider [place] or covering provider during after hours [place], for this patient?   Check the care team in [facility] and  look for a) attending/consulting [facility] provider listed and b) the [facility] team listed Log into [email or web address] and use [place]'s universal password to access. If you do not have the password, please contact the hospital operator. Locate the [facility] provider you are looking for under [facility] and page to a number that you can be directly reached. If you still have difficulty reaching the provider, please page the [facility] (Director on Call) for the Hospitalists listed on amion for assistance.  10/15/2021, 10:55 PM

## 2021-10-16 ENCOUNTER — Observation Stay (HOSPITAL_BASED_OUTPATIENT_CLINIC_OR_DEPARTMENT_OTHER): Payer: Self-pay

## 2021-10-16 ENCOUNTER — Observation Stay (HOSPITAL_COMMUNITY): Payer: Self-pay

## 2021-10-16 DIAGNOSIS — G453 Amaurosis fugax: Secondary | ICD-10-CM | POA: Insufficient documentation

## 2021-10-16 DIAGNOSIS — E785 Hyperlipidemia, unspecified: Secondary | ICD-10-CM | POA: Diagnosis present

## 2021-10-16 DIAGNOSIS — F17200 Nicotine dependence, unspecified, uncomplicated: Secondary | ICD-10-CM | POA: Insufficient documentation

## 2021-10-16 DIAGNOSIS — G459 Transient cerebral ischemic attack, unspecified: Secondary | ICD-10-CM

## 2021-10-16 DIAGNOSIS — F1020 Alcohol dependence, uncomplicated: Secondary | ICD-10-CM

## 2021-10-16 LAB — LIPID PANEL
Cholesterol: 217 mg/dL — ABNORMAL HIGH (ref 0–200)
HDL: 43 mg/dL (ref >40)
LDL Cholesterol: 155 mg/dL — ABNORMAL HIGH (ref 0–99)
Total CHOL/HDL Ratio: 5 RATIO
Triglycerides: 96 mg/dL (ref <150)
VLDL: 19 mg/dL (ref 0–40)

## 2021-10-16 LAB — ECHOCARDIOGRAM COMPLETE
AR max vel: 2.41 cm2
AV Area VTI: 2.01 cm2
AV Area mean vel: 2.22 cm2
AV Mean grad: 5 mmHg
AV Peak grad: 8.9 mmHg
Ao pk vel: 1.49 m/s
Area-P 1/2: 2.6 cm2
Calc EF: 72.8 %
Height: 66 in
S' Lateral: 2.1 cm
Single Plane A2C EF: 74.3 %
Single Plane A4C EF: 73.4 %
Weight: 2080 oz

## 2021-10-16 LAB — RESP PANEL BY RT-PCR (FLU A&B, COVID) ARPGX2
Influenza A by PCR: NEGATIVE
Influenza B by PCR: NEGATIVE
SARS Coronavirus 2 by RT PCR: NEGATIVE

## 2021-10-16 LAB — HIV ANTIBODY (ROUTINE TESTING W REFLEX): HIV Screen 4th Generation wRfx: NONREACTIVE

## 2021-10-16 MED ORDER — HYDRALAZINE HCL 20 MG/ML IJ SOLN
5.0000 mg | Freq: Four times a day (QID) | INTRAMUSCULAR | Status: DC | PRN
  Administered 2021-10-16: 5 mg via INTRAVENOUS
  Filled 2021-10-16: qty 1

## 2021-10-16 MED ORDER — CLOPIDOGREL BISULFATE 75 MG PO TABS: 75.0000 mg | ORAL_TABLET | Freq: Every day | ORAL | 0 refills | Status: AC

## 2021-10-16 MED ORDER — ASPIRIN 81 MG PO TBEC: 81.0000 mg | DELAYED_RELEASE_TABLET | Freq: Every day | ORAL | 11 refills | Status: DC

## 2021-10-16 MED ORDER — ATORVASTATIN CALCIUM 80 MG PO TABS: 80.0000 mg | ORAL_TABLET | Freq: Every day | ORAL | 2 refills | Status: DC

## 2021-10-16 NOTE — Progress Notes (Signed)
SLP Cancellation Note  Patient Details Name: Dorothy [name] MRN: [id number] DOB: May 24, 1956   Cancelled treatment:        Attempted to see pt for cognitive linguistic evaluation.  Pt denies changes to speech, word finding, thinking, or memory.  MRI 2/23 negative for acute findings.  Pt politely declines assessment at this time and does not appear to have any acute ST needs.  SLP will sign off.  If there is a change in pt level of function, please re-consult speech therapy.                                                                                                Celedonio Savage, MA, [place] Office: [phone] 10/16/2021, 11:59 AM

## 2021-10-16 NOTE — Evaluation (Signed)
Occupational Therapy Evaluation and Discharge  Patient Details Name: Dorothy [name] MRN: [id number] DOB: 1956/01/08 Today's Date: 10/16/2021   History of Present Illness Pt is a 66 y/o female presenting 2/22 with abdnormal vision changes. CT negative, MRI pending. PMH includes: depression, alcohol use, bronchitis, HTN.   Clinical Impression   Pt admitted for above and presenting at baseline independent level for ADLs, mobility.  NO cognitive, visual, strength or sensation changes noted.  She has intermittent support from daughter, lives alone and uses ub[employer] to get meds/groceries.  She has a headache during session, but VSS. No further acute OT needs identified and OT will sign off.      Recommendations for follow up therapy are one component of a multi-disciplinary discharge planning process, led by the attending physician.  Recommendations may be updated based on patient status, additional functional criteria and insurance authorization.   Follow Up Recommendations  No OT follow up    Assistance Recommended at Discharge PRN  Patient can return home with the following      Functional Status Assessment  Patient has not had a recent decline in their functional status  Equipment Recommendations  None recommended by OT    Recommendations for Other Services       Precautions / Restrictions Precautions Precautions: None Restrictions Weight Bearing Restrictions: No      Mobility Bed Mobility Overal bed mobility: Independent                  Transfers                          Balance Overall balance assessment: Independent                                         ADL either performed or assessed with clinical judgement   ADL Overall ADL's : Independent                                             Vision Baseline Vision/History: 1 Wears glasses (reading) Ability to See in Adequate Light: 0 Adequate Vision  Assessment?: No apparent visual deficits Additional Comments: visual assessment completed- [facility]     Perception     Praxis      Pertinent Vitals/Pain Pain Assessment Pain Assessment: 0-10 Pain Score: 3  Pain Descriptors / Indicators: Headache Pain Intervention(s): Limited activity within patient's tolerance, Monitored during session, Patient requesting pain meds-RN notified     Hand Dominance Right   Extremity/Trunk Assessment Upper Extremity Assessment Upper Extremity Assessment: Overall WFL for tasks assessed   Lower Extremity Assessment Lower Extremity Assessment: Overall WFL for tasks assessed       Communication Communication Communication: No difficulties   Cognition Arousal/Alertness: Awake/alert Behavior During Therapy: WFL for tasks assessed/performed Overall Cognitive Status: Within Functional Limits for tasks assessed                                       General Comments  reviewed BEFAST    Exercises     Shoulder Instructions      Home Living Family/patient expects to be discharged to:: Private residence Living Arrangements: Alone Available  Help at Discharge: Family;Available PRN/intermittently Type of Home: House Home Access: Stairs to enter [employer] of Steps: 5 Entrance Stairs-Rails: Can reach both Home Layout: One level     Bathroom Shower/Tub: Teacher, early years/pre: Standard     Home Equipment: None          Prior Functioning/Environment Prior Level of Function : Independent/Modified Independent               ADLs Comments: not driving or working, uses [employer]        OT Problem List:        OT Treatment/Interventions:      OT Goals(Current goals can be found in the care plan section) Acute Rehab OT Goals Patient Stated Goal: home OT Goal Formulation: With patient  OT Frequency:      Co-evaluation              AM-PAC OT "6 Clicks" Daily Activity     Outcome Measure Help  from another person eating meals?: None Help from another person taking care of personal grooming?: None Help from another person toileting, which includes using toliet, bedpan, or urinal?: None Help from another person bathing (including washing, rinsing, drying)?: None Help from another person to put on and taking off regular upper body clothing?: None Help from another person to put on and taking off regular lower body clothing?: None 6 Click Score: 24   End of Session Nurse Communication: Mobility status  Activity Tolerance: Patient tolerated treatment well Patient left: in bed;with call bell/phone within reach  OT Visit Diagnosis: Other (comment) (visual changes)                Time: 4259-5638 OT Time Calculation (min): 12 min Charges:  OT General Charges $OT Visit: 1 Visit OT Evaluation $OT Eval Low Complexity: 1 Low  Jolaine Artist, OT Acute Rehabilitation Services Pager [phone] Office [phone]   Delight Stare 10/16/2021, 10:08 AM

## 2021-10-16 NOTE — ED Provider Notes (Signed)
[facility] EMERGENCY DEPARTMENT Provider Note   CSN: [id number] Arrival date & time: 10/15/21  1509     History  Chief Complaint  Patient presents with   Visual Field Change    Dorothy [name] is a 66 y.o. female.  HPI     66yo female with history of hypertension, alcohol dependence, depression, melanoma, presents with concern for right eye visual change.  This AM she suddenly had feeling of eye pressure in her right eye, squiggly line across middle of vision horizontally and then everything below it went gray. Everything above it was clear. Did not think she had vision change in left eye but did not really test it.  Denies numbness, weakness, difficulty talking or walking, facial droop.  No chest pain, dyspnea.  Had mild headache.  No hx of migraines. No nausea, vomiting, fever. Symptoms lasted one minute then resolved.   Past Medical History:  Diagnosis Date   Alcoholic ([place])    Chronic bronchitis ([place])    Still smoking   Depression    GERD (gastroesophageal reflux disease)    Hoarseness 06/2010   Oral thrush---- Dr. Melida Quitter   Hypertension    Insomnia    Malignant melanoma ([place]) 2009   Removed from left shoulder    Home Medications Prior to Admission medications   Medication Sig Start Date End Date Taking? Authorizing Provider  acetaminophen (TYLENOL) 500 MG tablet Take 500 mg by mouth every 6 (six) hours as needed for mild pain or moderate pain.   Yes [provider]  aspirin EC 81 MG EC tablet Take 1 tablet (81 mg total) by mouth daily. Swallow whole. 10/17/21   Annita Brod, MD  atorvastatin (LIPITOR) 80 MG tablet Take 1 tablet (80 mg total) by mouth daily. 10/17/21   Annita Brod, MD  clopidogrel (PLAVIX) 75 MG tablet Take 1 tablet (75 mg total) by mouth daily for 21 days. 10/17/21 11/07/21  Annita Brod, MD      Allergies    Patient has no known allergies.    Review of Systems   Review of Systems  Physical  Exam Updated Vital Signs BP (!) 155/112    Pulse 90    Temp 98.9 F (37.2 C) (Oral)    Resp 17    Ht 5\' 6"  (1.676 m)    Wt 59 kg    SpO2 97%    BMI 20.98 kg/m  Physical Exam Vitals and nursing note reviewed.  Constitutional:      General: She is not in acute distress.    Appearance: Normal appearance. She is well-developed. She is not ill-appearing or diaphoretic.  HENT:     Head: Normocephalic and atraumatic.  Eyes:     General: No visual field deficit.    Extraocular Movements: Extraocular movements intact.     Conjunctiva/sclera: Conjunctivae normal.     Pupils: Pupils are equal, round, and reactive to light.  Cardiovascular:     Rate and Rhythm: Normal rate and regular rhythm.     Pulses: Normal pulses.     Heart sounds: Normal heart sounds. No murmur heard.   No friction rub. No gallop.  Pulmonary:     Effort: Pulmonary effort is normal. No respiratory distress.     Breath sounds: Normal breath sounds. No wheezing or rales.  Abdominal:     General: There is no distension.     Palpations: Abdomen is soft.     Tenderness: There is no abdominal  tenderness. There is no guarding.  Musculoskeletal:        General: No swelling or tenderness.     Cervical back: Normal range of motion.  Skin:    General: Skin is warm and dry.     Findings: No erythema or rash.  Neurological:     General: No focal deficit present.     Mental Status: She is alert and oriented to person, place, and time.     GCS: GCS eye subscore is 4. GCS verbal subscore is 5. GCS motor subscore is 6.     Cranial Nerves: No cranial nerve deficit, dysarthria or facial asymmetry.     Sensory: No sensory deficit.     Motor: No weakness or tremor.     Coordination: Coordination normal. Finger-Nose-Finger Test normal.     Gait: Gait normal.    ED Results / Procedures / Treatments   Labs (all labs ordered are listed, but only abnormal results are displayed) Labs Reviewed  LIPID PANEL - Abnormal; Notable for the  following components:      Result Value   Cholesterol 217 (*)    LDL Cholesterol 155 (*)    All other components within normal limits  RESP PANEL BY RT-PCR (FLU A&B, COVID) ARPGX2  CBC WITH DIFFERENTIAL/PLATELET  COMPREHENSIVE METABOLIC PANEL  ETHANOL  RAPID URINE DRUG SCREEN, HOSP PERFORMED  HIV ANTIBODY (ROUTINE TESTING W REFLEX)  HEMOGLOBIN A1C    EKG EKG Interpretation  Date/Time:  Wednesday October 15 2021 22:31:39 EST Ventricular Rate:  78 PR Interval:  160 QRS Duration: 102 QT Interval:  431 QTC Calculation: 491 R Axis:   92 Text Interpretation: Sinus rhythm Probable anterolateral infarct, old Confirmed by Addison Lank [phone]) on 10/16/2021 7:55:55 PM  Radiology CT ANGIO HEAD NECK W WO CM  Result Date: 10/15/2021 CLINICAL DATA:  Visual changes stroke suspected EXAM: CT ANGIOGRAPHY HEAD AND NECK TECHNIQUE: Multidetector CT imaging of the head and neck was performed using the standard protocol during bolus administration of intravenous contrast. Multiplanar CT image reconstructions and MIPs were obtained to evaluate the vascular anatomy. Carotid stenosis measurements (when applicable) are obtained utilizing NASCET criteria, using the distal internal carotid diameter as the denominator. RADIATION DOSE REDUCTION: This exam was performed according to the departmental dose-optimization program which includes automated exposure control, adjustment of the mA and/or kV according to patient size and/or use of iterative reconstruction technique. CONTRAST:  66mL OMNIPAQUE IOHEXOL 350 MG/ML SOLN COMPARISON:  None. FINDINGS: CT HEAD FINDINGS Brain: No evidence of acute infarction, hemorrhage, cerebral edema, mass, mass effect, or midline shift. No hydrocephalus or extra-axial fluid collection. Vascular: No hyperdense vessel. Skull: Normal. Negative for fracture or focal lesion. Sinuses/Orbits: Mucous retention cyst in the right maxillary sinus. Otherwise negative. The orbits are unremarkable.  Other: The mastoid air cells are well aerated. CTA NECK FINDINGS Aortic arch: Standard branching. Imaged portion shows no evidence of aneurysm or dissection. No significant stenosis of the major arch vessel origins. Aortic atherosclerosis, which extends into the origins of the branch vessels. Right carotid system: No evidence of dissection or occlusion. Approximately 70% narrowing at the origin of the right external carotid artery. Calcifications in the common carotid, at the bifurcation and in the proximal right ICA are not hemodynamically significant. Left carotid system: No evidence of dissection, stenosis (50% or greater) or occlusion. Calcifications in the left common carotid artery, at bifurcation, and in the left ICA and ECA are not hemodynamically significant. Vertebral arteries: Codominant. No evidence of dissection, stenosis (  50% or greater) or occlusion. Skeleton: No acute osseous abnormality. Other neck: Negative. Upper chest: Centrilobular and paraseptal emphysema. No focal pulmonary opacity or pleural effusion. Review of the MIP images confirms the above findings CTA HEAD Anterior circulation: Both internal carotid arteries are patent to the termini, with mild-to-moderate calcification but without significant stenosis. A1 segments patent. Normal anterior communicating artery. Anterior cerebral arteries are patent to their distal aspects. No M1 stenosis or occlusion. Normal MCA bifurcations. Distal MCA branches perfused and symmetric. Posterior circulation: Vertebral arteries patent to the vertebrobasilar junction without stenosis. Posterior inferior cerebral arteries patent bilaterally. Basilar patent to its distal aspect. Superior cerebellar arteries patent bilaterally. Patent P1 segments. PCAs perfused to their distal aspects without stenosis. The bilateral posterior communicating arteries are not visualized. Venous sinuses: As permitted by contrast timing, patent. Anatomic variants: None  significant. Review of the MIP images confirms the above findings IMPRESSION: 1.  No acute intracranial process. 2.  No intracranial large vessel occlusion or significant stenosis. 3. Approximately 70 percent narrowing at the origin of the right ECA. No other hemodynamically significant stenosis in the neck. Electronically Signed   By: Merilyn Baba M.D.   On: 10/15/2021 21:51   MR BRAIN WO CONTRAST  Result Date: 10/16/2021 CLINICAL DATA:  Transient ischemic attack (TIA) EXAM: MRI HEAD WITHOUT CONTRAST TECHNIQUE: Multiplanar, multiecho pulse sequences of the brain and surrounding structures were obtained without intravenous contrast. COMPARISON:  None. FINDINGS: Brain: There is no acute infarction or intracranial hemorrhage. There is no intracranial mass, mass effect, or edema. There is no hydrocephalus or extra-axial fluid collection. Ventricles and sulci are normal in size and configuration. Vascular: Major vessel flow voids at the skull base are preserved. Skull and upper cervical spine: Normal marrow signal is preserved. Sinuses/Orbits: Minor mucosal thickening. Right maxillary sinus retention cyst or polyp. Orbits are unremarkable. Other: Sella is unremarkable.  Mastoid air cells are clear. IMPRESSION: No evidence of recent infarction, hemorrhage, or mass. Electronically Signed   By: Macy Mis M.D.   On: 10/16/2021 09:06   ECHOCARDIOGRAM COMPLETE  Result Date: 10/16/2021    ECHOCARDIOGRAM REPORT   Patient Name:   Dorothy [name] Date of Exam: 10/16/2021 Medical Rec #:  [id number]          Height:       66.0 in Accession #:    [id number]         Weight:       130.0 lb Date of Birth:  1955-09-22         BSA:          1.665 m Patient Age:    6 years           BP:           146/106 mmHg Patient Gender: F                  HR:           58 bpm. Exam Location:  Inpatient Procedure: 2D Echo, Cardiac Doppler and Color Doppler Indications:    G45.9 TIA  History:        Patient has no prior history of  Echocardiogram examinations.                 Risk Factors:Hypertension. ALCOHOLIC.  Sonographer:    Beryle Beams Referring Phys: [id number] Candace Gallus MELVIN  Sonographer Comments: Suboptimal parasternal window. Image acquisition challenging due to breast implants. IMPRESSIONS  1. Left ventricular ejection fraction, by estimation,  is 70 to 75%. The left ventricle has hyperdynamic function. The left ventricle has no regional wall motion abnormalities. There is mild left ventricular hypertrophy. Left ventricular diastolic parameters are indeterminate.  2. Right ventricular systolic function is normal. The right ventricular size is normal.  3. The mitral valve is normal in structure. Trivial mitral valve regurgitation. No evidence of mitral stenosis.  4. The aortic valve has an indeterminant number of cusps. Aortic valve regurgitation is not visualized. No aortic stenosis is present.  5. The inferior vena cava is normal in size with greater than 50% respiratory variability, suggesting right atrial pressure of 3 mmHg. Comparison(s): No prior Echocardiogram. FINDINGS  Left Ventricle: Left ventricular ejection fraction, by estimation, is 70 to 75%. The left ventricle has hyperdynamic function. The left ventricle has no regional wall motion abnormalities. The left ventricular internal cavity size was normal in size. There is mild left ventricular hypertrophy. Left ventricular diastolic parameters are indeterminate. Right Ventricle: The right ventricular size is normal. Right ventricular systolic function is normal. Left Atrium: Left atrial size was normal in size. Right Atrium: Right atrial size was normal in size. Pericardium: There is no evidence of pericardial effusion. Mitral Valve: The mitral valve is normal in structure. Trivial mitral valve regurgitation. No evidence of mitral valve stenosis. Tricuspid Valve: The tricuspid valve is normal in structure. Tricuspid valve regurgitation is trivial. No evidence of  tricuspid stenosis. Aortic Valve: The aortic valve has an indeterminant number of cusps. Aortic valve regurgitation is not visualized. No aortic stenosis is present. Aortic valve mean gradient measures 5.0 mmHg. Aortic valve peak gradient measures 8.9 mmHg. Aortic valve area, by VTI measures 2.01 cm. Pulmonic Valve: The pulmonic valve was not well visualized. Pulmonic valve regurgitation is not visualized. No evidence of pulmonic stenosis. Aorta: The aortic root is normal in size and structure. Venous: The inferior vena cava is normal in size with greater than 50% respiratory variability, suggesting right atrial pressure of 3 mmHg. IAS/Shunts: No atrial level shunt detected by color flow Doppler.  LEFT VENTRICLE PLAX 2D LVIDd:         3.70 cm LVIDs:         2.10 cm LV PW:         1.00 cm LV IVS:        0.90 cm LVOT diam:     1.80 cm LV SV:         66 LV SV Index:   40 LVOT Area:     2.54 cm  LV Volumes (MOD) LV vol d, MOD A2C: 77.4 ml LV vol d, MOD A4C: 59.1 ml LV vol s, MOD A2C: 19.9 ml LV vol s, MOD A4C: 15.7 ml LV SV MOD A2C:     57.5 ml LV SV MOD A4C:     59.1 ml LV SV MOD BP:      50.5 ml RIGHT VENTRICLE             IVC RV S prime:     11.40 cm/s  IVC diam: 2.10 cm TAPSE (M-mode): 2.2 cm LEFT ATRIUM             Index        RIGHT ATRIUM           Index LA diam:        2.70 cm 1.62 cm/m   RA Area:     10.10 cm LA Vol (A2C):   35.1 ml 21.08 ml/m  RA Volume:   21.90  ml  13.15 ml/m LA Vol (A4C):   46.6 ml 27.98 ml/m LA Biplane Vol: 43.5 ml 26.12 ml/m  AORTIC VALVE                     PULMONIC VALVE AV Area (Vmax):    2.41 cm      PV Vmax:       0.61 m/s AV Area (Vmean):   2.22 cm      PV Vmean:      44.800 cm/s AV Area (VTI):     2.01 cm      PV VTI:        0.146 m AV Vmax:           149.00 cm/s   PV Peak grad:  1.5 mmHg AV Vmean:          105.000 cm/s  PV Mean grad:  1.0 mmHg AV VTI:            0.329 m AV Peak Grad:      8.9 mmHg AV Mean Grad:      5.0 mmHg LVOT Vmax:         141.00 cm/s LVOT Vmean:         91.500 cm/s LVOT VTI:          0.260 m LVOT/AV VTI ratio: 0.79  AORTA Ao Root diam: 2.50 cm Ao Asc diam:  2.50 cm MITRAL VALVE MV Area (PHT): 2.60 cm    SHUNTS MV Decel Time: 292 msec    Systemic VTI:  0.26 m MV E velocity: 69.80 cm/s  Systemic Diam: 1.80 cm MV A velocity: 90.00 cm/s MV E/A ratio:  0.78 Kirk Ruths MD Electronically signed by Kirk Ruths MD Signature Date/Time: 10/16/2021/1:22:41 PM    Final     Procedures Procedures    Medications Ordered in ED Medications  iohexol (OMNIPAQUE) 350 MG/ML injection 65 mL (65 mLs Intravenous Contrast Given 10/15/21 2121)    ED Course/ Medical Decision Making/ A&P                           Medical Decision Making Amount and/or Complexity of Data Reviewed Labs: ordered. Radiology: ordered.  Risk Prescription drug management. Decision regarding hospitalization.    66yo female with history of hypertension, alcohol dependence, depression, melanoma, presents with concern for right eye visual change.  DDx includes retinal detachment, TIA, CVA, amaurosis fugax, optical migraine.  Temporary symptoms now resolved, most consistent with amaurosis fugax vs branch retinal artery occlusion.    Will admit for continued care.  Ordered MRI, has ankle bracelet for etoh use--will need to get more information to remove.         Final Clinical Impression(s) / ED Diagnoses Final diagnoses:  Amaurosis fugax of right eye    Rx / DC Orders ED Discharge Orders          Ordered    aspirin EC 81 MG EC tablet  Daily        10/16/21 1234    atorvastatin (LIPITOR) 80 MG tablet  Daily        10/16/21 1234    clopidogrel (PLAVIX) 75 MG tablet  Daily        10/16/21 1234    Increase activity slowly        10/16/21 1234    Diet - low sodium heart healthy        10/16/21 1234    Ambulatory referral  to Neurology       Comments: Follow up with stroke clinic NP (Jessica Parrott or Cecille Rubin, if both not available, consider Zachery Dauer, or [place]) at [facility] in about 4 weeks. Thanks.   10/16/21 [place], Latesa Fratto, MD 10/16/21 2211

## 2021-10-16 NOTE — Hospital Course (Signed)
66 year old female past medical history of depression, alcohol abuse and hypertension presented to the emergency room on 2/22 with complaints of abnormal vision changes, specifically complaining of visual field changes in her right eye that lasted about a minute consisting of squiggly lines across the middle of her vision.  Seen by neurology who recommended stroke work-up.  MRI unremarkable.  Noted to have LDL of 155.

## 2021-10-16 NOTE — Progress Notes (Signed)
PT Cancellation Note  Patient Details Name: Dorothy [name] MRN: [id number] DOB: 1956/04/30   Cancelled Treatment:    Reason Eval/Treat Not Completed: PT screened, no needs identified, will sign off. Per OT pt without any PT needs.    Shary Decamp [facility] 10/16/2021, 9:41 AM [place] Pager [phone] Office [phone]

## 2021-10-16 NOTE — TOC CAGE-AID Note (Signed)
Transition of Care [facility]) - CAGE-AID Screening   Patient Details  Name: Dorothy [name] MRN: [id number] Date of Birth: 01-19-1956  [facility] CM/SW Contact:    Gaetano Hawthorne Tarpley-Carter, [place] Phone Number: 10/16/2021, 9:24 AM   Clinical Narrative: Pt participated in [place].  Pt stated she does use substance (cigarettes and marijuana), and drinks ETOH.  Pt was offered resources, due to usage of substance and ETOH.     Raley Novicki Tarpley-Carter, MSW, LCSW-A Pronouns:  She/Her/Hers [place] Transitions of Care Clinical Social Worker Direct Number:  [phone] [email or web address]   CAGE-AID Screening:    Have You Ever Felt You Ought to Cut Down on Your Drinking or Drug Use?: No Have People Annoyed You By [employer] Your Drinking Or Drug Use?: No Have You Felt Bad Or Guilty About Your Drinking Or Drug Use?: No Have You Ever Had a Drink or Used Drugs First Thing In The Morning to Steady Your Nerves or to Get Rid of a Hangover?: No CAGE-AID Score: 0  Substance Abuse Education Offered: Yes  Substance abuse interventions: Scientist, clinical (histocompatibility and immunogenetics)

## 2021-10-16 NOTE — Progress Notes (Signed)
° °  Echocardiogram 2D Echocardiogram has been performed.  Dorothy [name] 10/16/2021, 11:36 AM

## 2021-10-16 NOTE — ED Notes (Signed)
MD notified of pt's HTN. Awaiting new orders

## 2021-10-16 NOTE — Progress Notes (Addendum)
STROKE TEAM PROGRESS NOTE   SUBJECTIVE (INTERVAL HISTORY) Dorothy [name] is seen without family at the bedside; 2D echo being obtained.  Overall her condition is completely resolved.  Patient reports that yesterday Dorothy [name] had the acute onset of transient right lower visual field loss for approximately 1 minute.  This morning, Dorothy [name] reports her vision was restored, but Dorothy [name] has a frontal temporal headache that is all in nature.  Dorothy [name] denies history of migraines or other types of headache.  Dorothy [name] does report a history of hypertension and running out of her antihypertensives.  CTH and MRI with no acute abnormalities.   OBJECTIVE CBC:  Recent Labs  Lab 10/15/21 1916  WBC 8.6  NEUTROABS 5.4  HGB 14.2  HCT 42.8  MCV 98.2  PLT 854   Basic Metabolic Panel:  Recent Labs  Lab 10/15/21 1916  NA 136  K 4.0  CL 103  CO2 24  GLUCOSE 92  BUN 13  CREATININE 0.57  CALCIUM 9.2   Lipid Panel:  Recent Labs  Lab 10/16/21 0605  CHOL 217*  TRIG 96  HDL 43  CHOLHDL 5.0  VLDL 19  LDLCALC 155*   HgbA1c: No results for input(s): HGBA1C in the last 168 hours. Urine Drug Screen:  Recent Labs  Lab 10/15/21 1909  LABOPIA NONE DETECTED  COCAINSCRNUR NONE DETECTED  LABBENZ NONE DETECTED  AMPHETMU NONE DETECTED  THCU NONE DETECTED  LABBARB NONE DETECTED    Alcohol Level  Recent Labs  Lab 10/15/21 1916  ETH <10    IMAGING past 24 hours CT ANGIO HEAD NECK W WO CM  Result Date: 10/15/2021 CLINICAL DATA:  Visual changes stroke suspected EXAM: CT ANGIOGRAPHY HEAD AND NECK TECHNIQUE: Multidetector CT imaging of the head and neck was performed using the standard protocol during bolus administration of intravenous contrast. Multiplanar CT image reconstructions and MIPs were obtained to evaluate the vascular anatomy. Carotid stenosis measurements (when applicable) are obtained utilizing NASCET criteria, using the distal internal carotid diameter as the denominator. RADIATION DOSE REDUCTION: This exam was  performed according to the departmental dose-optimization program which includes automated exposure control, adjustment of the mA and/or kV according to patient size and/or use of iterative reconstruction technique. CONTRAST:  52mL OMNIPAQUE IOHEXOL 350 MG/ML SOLN COMPARISON:  None. FINDINGS: CT HEAD FINDINGS Brain: No evidence of acute infarction, hemorrhage, cerebral edema, mass, mass effect, or midline shift. No hydrocephalus or extra-axial fluid collection. Vascular: No hyperdense vessel. Skull: Normal. Negative for fracture or focal lesion. Sinuses/Orbits: Mucous retention cyst in the right maxillary sinus. Otherwise negative. The orbits are unremarkable. Other: The mastoid air cells are well aerated. CTA NECK FINDINGS Aortic arch: Standard branching. Imaged portion shows no evidence of aneurysm or dissection. No significant stenosis of the major arch vessel origins. Aortic atherosclerosis, which extends into the origins of the branch vessels. Right carotid system: No evidence of dissection or occlusion. Approximately 70% narrowing at the origin of the right external carotid artery. Calcifications in the common carotid, at the bifurcation and in the proximal right ICA are not hemodynamically significant. Left carotid system: No evidence of dissection, stenosis (50% or greater) or occlusion. Calcifications in the left common carotid artery, at bifurcation, and in the left ICA and ECA are not hemodynamically significant. Vertebral arteries: Codominant. No evidence of dissection, stenosis (50% or greater) or occlusion. Skeleton: No acute osseous abnormality. Other neck: Negative. Upper chest: Centrilobular and paraseptal emphysema. No focal pulmonary opacity or pleural effusion. Review of the MIP images confirms the above findings  CTA HEAD Anterior circulation: Both internal carotid arteries are patent to the termini, with mild-to-moderate calcification but without significant stenosis. A1 segments patent. Normal  anterior communicating artery. Anterior cerebral arteries are patent to their distal aspects. No M1 stenosis or occlusion. Normal MCA bifurcations. Distal MCA branches perfused and symmetric. Posterior circulation: Vertebral arteries patent to the vertebrobasilar junction without stenosis. Posterior inferior cerebral arteries patent bilaterally. Basilar patent to its distal aspect. Superior cerebellar arteries patent bilaterally. Patent P1 segments. PCAs perfused to their distal aspects without stenosis. The bilateral posterior communicating arteries are not visualized. Venous sinuses: As permitted by contrast timing, patent. Anatomic variants: None significant. Review of the MIP images confirms the above findings IMPRESSION: 1.  No acute intracranial process. 2.  No intracranial large vessel occlusion or significant stenosis. 3. Approximately 70 percent narrowing at the origin of the right ECA. No other hemodynamically significant stenosis in the neck. Electronically Signed   By: Merilyn Baba M.D.   On: 10/15/2021 21:51   MR BRAIN WO CONTRAST  Result Date: 10/16/2021 CLINICAL DATA:  Transient ischemic attack (TIA) EXAM: MRI HEAD WITHOUT CONTRAST TECHNIQUE: Multiplanar, multiecho pulse sequences of the brain and surrounding structures were obtained without intravenous contrast. COMPARISON:  None. FINDINGS: Brain: There is no acute infarction or intracranial hemorrhage. There is no intracranial mass, mass effect, or edema. There is no hydrocephalus or extra-axial fluid collection. Ventricles and sulci are normal in size and configuration. Vascular: Major vessel flow voids at the skull base are preserved. Skull and upper cervical spine: Normal marrow signal is preserved. Sinuses/Orbits: Minor mucosal thickening. Right maxillary sinus retention cyst or polyp. Orbits are unremarkable. Other: Sella is unremarkable.  Mastoid air cells are clear. IMPRESSION: No evidence of recent infarction, hemorrhage, or mass.  Electronically Signed   By: Macy Mis M.D.   On: 10/16/2021 09:06     PHYSICAL EXAM Temp:  [98.9 F (37.2 C)] 98.9 F (37.2 C) (02/22 1525) Pulse Rate:  [61-91] 67 (02/23 1100) Resp:  [12-23] 17 (02/23 1100) BP: (126-198)/(65-106) 168/98 (02/23 1100) SpO2:  [93 %-100 %] 99 % (02/23 1100) Weight:  [59 kg] 59 kg (02/22 1528)  General - Well nourished, well developed, age congruent woman in no apparent distress.  Cardiovascular - Regular rhythm and rate on telemetry.  Mental Status -  Level of arousal and orientation to time, place, and person were intact. Language including expression, naming, repetition, comprehension was assessed and found intact. Attention span and concentration were normal. Recent and remote memory were intact. Fund of Knowledge was assessed and was intact.  Cranial Nerves II - XII - II - Visual field intact OU. III, IV, VI - Extraocular movements intact. V - Facial sensation intact bilaterally. VII - Facial movement intact bilaterally. VIII - Hearing & vestibular intact bilaterally. X - Palate elevates symmetrically. XI - Chin turning & shoulder shrug intact bilaterally. XII - Tongue protrusion intact.  Motor Strength - The patients strength was normal in all extremities and pronator drift was absent.  Bulk was normal and fasciculations were absent.   Motor Tone - Muscle tone was assessed at the neck and appendages and was normal.  Sensory - Light touch was assessed and symmetrical.    Coordination - The patient had normal movements in the hands and feet with no ataxia or dysmetria.  Tremor was absent.  Gait and Station - deferred.    ASSESSMENT/PLAN Dorothy [name] is a 66 y.o. female with history of  alcoholism, tobacco abuse, hypertension,  and depression presenting with transient altitudinal visual loss from the right eye.   R amaurosis fugax CT Head/ CTA head & neck No acute abnormalities; no large vessel occlusion or stenosis; 70%  narrowing at origin of R ECA. MRI no acute intracranial abnormalities 2D Echo EF 70-75% LDL 155 HgbA1c pending VTE prophylaxis - SCDs    Diet   Diet Heart Room service appropriate? Yes; Fluid consistency: Thin   No antithrombotic prior to admission, now on aspirin 81 mg daily and clopidogrel 75 mg daily x 3 weeks, then ASA alone Therapy recommendations: No follow-up Disposition: Home upon discharge  Hypertension Home meds: Norvasc 5 mg Unstable Goal SBP <160, but gradually normalize in 5-7 days Long-term BP goal normotensive  Hyperlipidemia Home meds:  N/A LDL 155, goal < 70 Continue Atorvastatin 80 mg  High intensity statin indicated  Continue statin at discharge  Other Stroke Risk Factors Cigarette smoker; advised to stop smoking ETOH use, alcohol level <10, advised to drink no more than 1-2 drink(s) a day Substance abuse - UDS:  THC NONE DETECTED, Cocaine NONE DETECTED.   Other Active Problems Tobacco Use Disorder 1.5ppd smoker; Nicotine patch in place Chronic Bronchitis  Hospital day # 0   Rosezetta Schlatter, MD Stroke Neurology- Neuro Psych Resident 10/16/2021 12:21 PM  ATTENDING NOTE: I reviewed above note and agree with the assessment and plan. Pt was seen and examined.   66 year old female with history of alcohol abuse, smoker, hypertension, depression presented with amaurosis fugax.  Dorothy [name] had right lower vision loss for 5 minutes and resolved.  Currently back to baseline.  CT no acute abnormality, CTA head and neck no ICA or CCA stenosis or unstable plaque.  MRI no acute infarct.  EF 70 to 75%.  LDL 155, A1c pending.  UDS negative.  Creatinine 0.57.  On exam, patient neurologically intact, no neurodeficit.  Etiology for patient's symptoms concerning for atherosclerosis likely within ophthalmic artery system given risk factors.  Recommend aggressive risk factor modification including alcohol limitation and quit smoking.  Continue aspirin 81 and Plavix 75 DAPT for 3  weeks and then aspirin alone.  Continue Lipitor 80.  Follow-up at [facility] as outpatient.  For detailed assessment and plan, please refer to above as I have made changes wherever appropriate.   Neurology will sign off. Please call with questions. Pt will follow up with stroke clinic NP at [facility] in about 4 weeks. Thanks for the consult.   [name], MD PhD Stroke Neurology 10/16/2021 1:49 PM    To contact Stroke Continuity provider, please refer to [email or web address] After hours, contact General Neurology

## 2021-10-17 ENCOUNTER — Telehealth: Payer: Self-pay | Admitting: *Deleted

## 2021-10-17 LAB — HEMOGLOBIN A1C
Hgb A1c MFr Bld: 5.5 % (ref 4.8–5.6)
Mean Plasma Glucose: 111 mg/dL

## 2021-10-17 NOTE — Telephone Encounter (Signed)
Pt called regarding pharmacy not receiving Rx as stated on After Visit Summary (AVS).  RNCM called [facility] to confirm receipt as noted in communication, and pharmacy staff confirmed that they DO have pt Rx and will prepare it for pick-up.  RNCM called pt back and left voicemail to pick-up Rx around lunchtime.

## 2021-11-14 ENCOUNTER — Other Ambulatory Visit: Payer: Self-pay

## 2021-11-14 ENCOUNTER — Encounter: Payer: Self-pay | Admitting: Nurse Practitioner

## 2021-11-14 ENCOUNTER — Ambulatory Visit (INDEPENDENT_AMBULATORY_CARE_PROVIDER_SITE_OTHER): Payer: Self-pay | Admitting: Nurse Practitioner

## 2021-11-14 VITALS — BP 134/80 | HR 78 | Ht 66.0 in | Wt 138.0 lb

## 2021-11-14 DIAGNOSIS — F32A Depression, unspecified: Secondary | ICD-10-CM | POA: Insufficient documentation

## 2021-11-14 DIAGNOSIS — G4700 Insomnia, unspecified: Secondary | ICD-10-CM | POA: Insufficient documentation

## 2021-11-14 DIAGNOSIS — F419 Anxiety disorder, unspecified: Secondary | ICD-10-CM

## 2021-11-14 MED ORDER — TRAZODONE HCL 150 MG PO TABS: 150.0000 mg | ORAL_TABLET | Freq: Every day | ORAL | 0 refills | Status: DC

## 2021-11-14 MED ORDER — FLUOXETINE HCL 20 MG PO TABS: 20.0000 mg | ORAL_TABLET | Freq: Every day | ORAL | 0 refills | Status: DC

## 2021-11-14 MED ORDER — TRAZODONE HCL 50 MG PO TABS: 50.0000 mg | ORAL_TABLET | Freq: Every day | ORAL | 0 refills | Status: DC

## 2021-11-14 NOTE — Progress Notes (Signed)
$'@Patient'g$  ID: Dorothy [name], female    DOB: [birth year], 66 y.o.   MRN: [id number] ? ?Chief Complaint  ?Patient presents with  ? New Patient (Initial Visit)  ? ? ?Referring provider: ?Josetta Huddle, MD ? ? ?HPI ? ?Patient presents today to establish care.  Patient is needing refills on her medications today.  Patient states that she does have a history of hypertension currently on Norvasc, hyperlipidemia currently on Lipitor, insomnia currently on trazodone 150 mg, anxiety and depression previous previously on Prozac, current everyday smoker, alcoholism.  Patient states that she is struggling with anxiety and depression again and would like to be restarted on her Prozac.  She states that she has planned a quit date for smoking.  She states that she has stopped drinking any alcohol.  We discussed that we do have counselors available in the office and she declines counseling at this time but states that she may set up an appointment in the near future.  Patient is currently trying to get a new job but does not have good transportation available.  She is trying to get a job in this area close to her home and would like to establish care here at this office because it is also close to her home.  Due to transportation issues as well. Denies f/c/s, n/v/d, hemoptysis, PND, chest pain or edema. ? ? ? ?No Known Allergies ? ?There is no immunization history for the selected administration types on file for this patient. ? ?Past Medical History:  ?Diagnosis Date  ? Alcoholic ([place])   ? Chronic bronchitis ([place])   ? Still smoking  ? Depression   ? GERD (gastroesophageal reflux disease)   ? Hoarseness 06/2010  ? Oral thrush---- Dr. Melida Quitter  ? Hypertension   ? Insomnia   ? Malignant melanoma ([place]) 2009  ? 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 ? ?Tobacco History: ?Social History  ? ?Tobacco Use  ?Smoking Status Every Day  ? Packs/day: 1.50  ? Types: Cigarettes  ? Start date: 08/24/1972  ?Smokeless Tobacco Never  ? ?Ready to quit: Not  Answered ?Counseling given: Not Answered ? ? ?Outpatient Encounter Medications as of 11/14/2021  ?Medication Sig  ? amLODipine (NORVASC) 5 MG tablet Take 5 mg by mouth daily.  ? atorvastatin (LIPITOR) 80 MG tablet Take 1 tablet (80 mg total) by mouth daily.  ? clopidogrel (PLAVIX) 75 MG tablet Take 75 mg by mouth daily.  ? FLUoxetine (PROZAC) 20 MG tablet Take 1 tablet (20 mg total) by mouth daily.  ? [DISCONTINUED] traZODone (DESYREL) 150 MG tablet Take 1 tablet (150 mg total) by mouth at bedtime.  ? [DISCONTINUED] traZODone (DESYREL) 50 MG tablet Take 1 tablet (50 mg total) by mouth at bedtime.  ? acetaminophen (TYLENOL) 500 MG tablet Take 500 mg by mouth every 6 (six) hours as needed for mild pain or moderate pain. (Patient not taking: Reported on 11/14/2021)  ? aspirin EC 81 MG EC tablet Take 1 tablet (81 mg total) by mouth daily. Swallow whole. (Patient not taking: Reported on 11/14/2021)  ? traZODone (DESYREL) 150 MG tablet Take 1 tablet (150 mg total) by mouth at bedtime.  ? ?No facility-administered encounter medications on file as of 11/14/2021.  ? ? ? ?Review of Systems ? ?Review of Systems  ?Constitutional: Negative.   ?HENT: Negative.    ?Cardiovascular: Negative.   ?Gastrointestinal: Negative.   ?Allergic/Immunologic: Negative.   ?Neurological: Negative.   ?Psychiatric/Behavioral: Negative.     ? ? ? ?Physical Exam ? ?  BP 134/80   Pulse 78   Ht '5\' 6"'$  (1.676 m)   Wt 138 lb (62.6 kg)   SpO2 96%   BMI 22.27 kg/m?  ? ?Wt Readings from Last 5 Encounters:  ?11/14/21 138 lb (62.6 kg)  ?10/15/21 130 lb (59 kg)  ?10/30/20 148 lb 3.2 oz (67.2 kg)  ?08/21/19 129 lb (58.5 kg)  ?06/06/18 130 lb (59 kg)  ? ? ? ?Physical Exam ?Vitals and nursing note reviewed.  ?Constitutional:   ?   General: She is not in acute distress. ?   Appearance: She is well-developed.  ?Cardiovascular:  ?   Rate and Rhythm: Normal rate and regular rhythm.  ?Pulmonary:  ?   Effort: Pulmonary effort is normal.  ?   Breath sounds: Normal breath  sounds.  ?Neurological:  ?   Mental Status: She is alert and oriented to person, place, and time.  ? ? ? ? ? ?Assessment & Plan:  ? ?Anxiety and depression ?- FLUoxetine (PROZAC) 20 MG tablet; Take 1 tablet (20 mg total) by mouth daily.  Dispense: 30 tablet; Refill: 0 ? ?Counseling available if needed ? ?2. Insomnia, unspecified type ? ?- traZODone (DESYREL) 150 MG tablet; Take 1 tablet (150 mg total) by mouth at bedtime.  Dispense: 30 tablet; Refill: 0 ? ?Follow up: ? ?Follow up in 4 weeks for anxiety and depression with Dr. Redmond Pulling or Amy ? ? ? ? ?Fenton Foy, NP ?11/14/2021 ? ?

## 2021-11-14 NOTE — Assessment & Plan Note (Signed)
-   FLUoxetine (PROZAC) 20 MG tablet; Take 1 tablet (20 mg total) by mouth daily.  Dispense: 30 tablet; Refill: 0 ? ?Counseling available if needed ? ?2. Insomnia, unspecified type ? ?- traZODone (DESYREL) 150 MG tablet; Take 1 tablet (150 mg total) by mouth at bedtime.  Dispense: 30 tablet; Refill: 0 ? ?Follow up: ? ?Follow up in 4 weeks for anxiety and depression with Dr. Redmond Pulling or Amy ?

## 2021-11-14 NOTE — Patient Instructions (Signed)
1. Anxiety and depression ? ?- FLUoxetine (PROZAC) 20 MG tablet; Take 1 tablet (20 mg total) by mouth daily.  Dispense: 30 tablet; Refill: 0 ? ?Counseling available if needed ? ?2. Insomnia, unspecified type ? ?- traZODone (DESYREL) 150 MG tablet; Take 1 tablet (150 mg total) by mouth at bedtime.  Dispense: 30 tablet; Refill: 0 ? ?Follow up: ? ?Follow up in 4 weeks for anxiety and depression with Dr. Redmond Pulling or Amy ? ? ? ?Managing the Challenge of Quitting Smoking ?Quitting smoking is a physical and mental challenge. You will face cravings, withdrawal symptoms, and temptation. Before quitting, work with your health care provider to make a plan that can help you manage quitting. Preparation can help you quit and keep you from giving in. ?How to manage lifestyle changes ?Managing stress ?Stress can make you want to smoke, and wanting to smoke may cause stress. It is important to find ways to manage your stress. You might try some of the following: ?Practice relaxation techniques. ?Breathe slowly and deeply, in through your nose and out through your mouth. ?Listen to music. ?Soak in a bath or take a shower. ?Imagine a peaceful place or vacation. ?Get some support. ?Talk with family or friends about your stress. ?Join a support group. ?Talk with a counselor or therapist. ?Get some physical activity. ?Go for a walk, run, or bike ride. ?Play a favorite sport. ?Practice yoga. ? ?Medicines ?Talk with your health care provider about medicines that might help you deal with cravings and make quitting easier for you. ?Relationships ?Social situations can be difficult when you are quitting smoking. To manage this, you can: ?Avoid parties and other social situations where people might be smoking. ?Avoid alcohol. ?Leave right away if you have the urge to smoke. ?Explain to your family and friends that you are quitting smoking. Ask for support and let them know you might be a bit grumpy. ?Plan activities where smoking is not an  option. ?General instructions ?Be aware that many people gain weight after they quit smoking. However, not everyone does. To keep from gaining weight, have a plan in place before you quit and stick to the plan after you quit. Your plan should include: ?Having healthy snacks. When you have a craving, it may help to: ?Eat popcorn, carrots, celery, or other cut vegetables. ?Chew sugar-free gum. ?Changing how you eat. ?Eat small portion sizes at meals. ?Eat 4-6 small meals throughout the day instead of 1-2 large meals a day. ?Be mindful when you eat. Do not watch television or do other things that might distract you as you eat. ?Exercising regularly. ?Make time to exercise each day. If you do not have time for a long workout, do short bouts of exercise for 5-10 minutes several times a day. ?Do some form of strengthening exercise, such as weight lifting. ?Do some exercise that gets your heart beating and causes you to breathe deeply, such as walking fast, running, swimming, or biking. This is very important. ?Drinking plenty of water or other low-calorie or no-calorie drinks. Drink 6-8 glasses of water daily. ? ?How to recognize withdrawal symptoms ?Your body and mind may experience discomfort as you try to get used to not having nicotine in your system. These effects are called withdrawal symptoms. They may include: ?Feeling hungrier than normal. ?Having trouble concentrating. ?Feeling irritable or restless. ?Having trouble sleeping. ?Feeling depressed. ?Craving a cigarette. ?To manage withdrawal symptoms: ?Avoid places, people, and activities that trigger your cravings. ?Remember why you want to quit. ?  Get plenty of sleep. ?Avoid coffee and other caffeinated drinks. These may worsen some of your symptoms. ?These symptoms may surprise you. But be assured that they are normal to have when quitting smoking. ?How to manage cravings ?Come up with a plan for how to deal with your cravings. The plan should include the  following: ?A definition of the specific situation you want to deal with. ?An alternative action you will take. ?A clear idea for how this action will help. ?The name of someone who might help you with this. ?Cravings usually last for 5-10 minutes. Consider taking the following actions to help you with your plan to deal with cravings: ?Keep your mouth busy. ?Chew sugar-free gum. ?Suck on hard candies or a straw. ?Brush your teeth. ?Keep your hands and body busy. ?Change to a different activity right away. ?Squeeze or play with a ball. ?Do an activity or a hobby, such as making bead jewelry, practicing needlepoint, or working with wood. ?Mix up your normal routine. ?Take a short exercise break. Go for a quick walk or run up and down stairs. ?Focus on doing something kind or helpful for someone else. ?Call a friend or family member to talk during a craving. ?Join a support group. ?Contact a quitline. ?Where to find support ?To get help or find a support group: ?Call the [place] [facility]'s Smoking Quitline: 1-800-QUIT NOW (581) 691-3306) ?Visit the website of the Substance Abuse and Mental Health Services Administration: ktimeonline.com ?Text QUIT to SmokefreeTXT: 450388 ?Where to find more information ?Visit these websites to find more information on quitting smoking: ?[place]: www.smokefree.gov ?American Lung Association: www.lung.org ?American Cancer Society: www.cancer.org ?Centers for Disease Control and Prevention: http://www.wolf.info/ ?American Heart Association: www.heart.org ?Contact a health care provider if: ?You want to change your plan for quitting. ?The medicines you are taking are not helping. ?Your eating feels out of control or you cannot sleep. ?Get help right away if: ?You feel depressed or become very anxious. ?Summary ?Quitting smoking is a physical and mental challenge. You will face cravings, withdrawal symptoms, and temptation to smoke again. Preparation can help you as you go  through these challenges. ?Try different techniques to manage stress, handle social situations, and prevent weight gain. ?You can deal with cravings by keeping your mouth busy (such as by chewing gum), keeping your hands and body busy, calling family or friends, or contacting a quitline for people who want to quit smoking. ?You can deal with withdrawal symptoms by avoiding places where people smoke, getting plenty of rest, and avoiding drinks with caffeine. ?This information is not intended to replace advice given to you by your health care provider. Make sure you discuss any questions you have with your health care provider. ?Document Revised: 04/18/2021 Document Reviewed: 05/30/2019 ?Elsevier Patient Education ? 2022 [place]. ? ? ?

## 2021-12-10 NOTE — Progress Notes (Signed)
? ? ?Patient ID: Dorothy [name], female    DOB: April 20, 1956  MRN: [id number] ? ?CC: Anxiety Depression Follow-Up ? ?Subjective: ?Dorothy [name] is a 66 y.o. female who presents for anxiety depression follow-up.  ? ?Her concerns today include:  ?ANXIETY DEPRESSION FOLLOW-UP: ?11/14/2021 with Lazaro Arms, NP: ?- FLUoxetine (PROZAC) 20 MG tablet; Take 1 tablet (20 mg total) by mouth daily.  Dispense: 30 tablet; Refill: 0 ?Counseling available if needed ? ?12/15/2021: ?Doing well on current regimen, no issues/concerns. Reports not taking medication daily as prescribed stating she forgets to take.  ? ?2. INSOMNIA FOLLOW-UP: ?11/14/2021 with Lazaro Arms, NP: ?- traZODone (DESYREL) 150 MG tablet; Take 1 tablet (150 mg total) by mouth at bedtime.  Dispense: 30 tablet; Refill: 0 ? ?12/15/2021: ?Doing well on current regimen, no issues/concerns. Reports taking 75 mg dosage is better than taking full dose.  ? ?3. HYPERTENSION: ?Taking Amlodipine as prescribed, no issues/concerns. Does not check blood pressure at home. Smoking 2 packs cigarettes daily. Adds salt to food. Not exercising outside of normal routine. Denies red flag symptoms such as but not limited to chest pain and shortness of breath.  ? ?4. HYPERLIPIDEMIA: ?Requesting refills of Atorvastatin, no issues/concerns.  ? ? ? ?  12/15/2021  ?  1:40 PM 11/14/2021  ? 10:52 AM  ?Depression screen PHQ 2/9  ?Decreased Interest 0 0  ?Down, Depressed, Hopeless 2 1  ?PHQ - 2 Score 2 1  ?Altered sleeping 1 1  ?Tired, decreased energy 1 1  ?Change in appetite 0 0  ?Feeling bad or failure about yourself  1 1  ?Trouble concentrating 0 0  ?Moving slowly or fidgety/restless 0 0  ?Suicidal thoughts 0 0  ?PHQ-9 Score 5 4  ?Difficult doing work/chores Somewhat difficult   ? ? ?Patient Active Problem List  ? Diagnosis Date Noted  ? Anxiety and depression 11/14/2021  ? Insomnia 11/14/2021  ? Hyperlipidemia 10/16/2021  ? Amaurosis fugax of right eye   ? Smoker   ? TIA (transient  ischemic attack) 10/15/2021  ? HTN (hypertension) 10/15/2021  ? Chronic bronchitis ([place]) 10/15/2021  ? 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 MDD (major depressive disorder), recurrent episode, moderate ([place]) 08/28/2019  ? 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 Alcohol use disorder, severe, dependence ([place]) 06/08/2018  ?  ? ?Current Outpatient Medications on File Prior to Visit  ?Medication Sig Dispense Refill  ? acetaminophen (TYLENOL) 500 MG tablet Take 500 mg by mouth every 6 (six) hours as needed for mild pain or moderate pain. (Patient not taking: Reported on 11/14/2021)    ? aspirin EC 81 MG EC tablet Take 1 tablet (81 mg total) by mouth daily. Swallow whole. (Patient not taking: Reported on 11/14/2021) 30 tablet 11  ? clopidogrel (PLAVIX) 75 MG tablet Take 75 mg by mouth daily.    ? ?No current facility-administered medications on file prior to visit.  ? ? ?No Known Allergies ? ?Social History  ? ?Socioeconomic History  ? Marital status: Divorced  ?  Spouse name: Not on file  ? Number of children: Not on file  ? Years of education: Not on file  ? Highest education level: Not on file  ?Occupational History  ? Not on file  ?Tobacco Use  ? Smoking status: Every Day  ?  Packs/day: 1.50  ?  Types: Cigarettes  ?  Start date: 08/24/1972  ?  Passive exposure: Current  ? Smokeless tobacco: Never  ?Vaping Use  ?  Vaping Use: Never used  ?Substance and Sexual Activity  ? Alcohol use: Yes  ?  Comment: >1 pint vodka daily and wine  ? Drug use: Yes  ?  Types: Marijuana  ? Sexual activity: Not on file  ?Other Topics Concern  ? Not on file  ?Social History Narrative  ? Not on file  ? ?Social Determinants of Health  ? ?Financial Resource Strain: Not on file  ?Food Insecurity: Not on file  ?Transportation Needs: Not on file  ?Physical Activity: Not on file  ?Stress: Not on file  ?Social Connections: Not on file  ?Intimate Partner Violence: Not on file  ? ? ?Family History   ?Problem Relation Age of Onset  ? Cirrhosis Father   ? ? ?Past Surgical History:  ?Procedure Laterality Date  ? BASAL CELL CARCINOMA EXCISION  2012  ? Right side nose   ? BREAST ENHANCEMENT SURGERY  2002  ? ELBOW SURGERY    ? Right elbow surgery---Ruptured tendon  ? ? ?ROS: ?Review of Systems ?Negative except as stated above ? ?PHYSICAL EXAM: ?BP 115/70 (BP Location: Left Arm, Patient Position: Sitting, Cuff Size: Normal)   Pulse 63   Temp 98.3 ?F (36.8 ?C)   Resp 18   Ht 5' 5.98" (1.676 m)   Wt 136 lb (61.7 kg)   SpO2 97%   BMI 21.96 kg/m?  ? ?Physical Exam ?HENT:  ?   Head: Normocephalic and atraumatic.  ?Eyes:  ?   Extraocular Movements: Extraocular movements intact.  ?   Conjunctiva/sclera: Conjunctivae normal.  ?   Pupils: Pupils are equal, round, and reactive to light.  ?Cardiovascular:  ?   Rate and Rhythm: Normal rate and regular rhythm.  ?   Pulses: Normal pulses.  ?   Heart sounds: Normal heart sounds.  ?Pulmonary:  ?   Effort: Pulmonary effort is normal.  ?   Breath sounds: Normal breath sounds.  ?Musculoskeletal:  ?   Cervical back: Normal range of motion and neck supple.  ?Neurological:  ?   General: No focal deficit present.  ?   Mental Status: She is alert and oriented to person, place, and time.  ?Psychiatric:     ?   Mood and Affect: Mood normal.     ?   Behavior: Behavior normal.  ? ? ?ASSESSMENT AND PLAN: ?1. Anxiety and depression: ?- Patient denies thoughts of self-harm, suicidal ideations, homicidal ideations. ?- Continue Fluoxetine as prescribed. Counseled on medication adherence and adverse effects. ?- Follow-up with primary provider in 4 months or sooner if needed.  ?- FLUoxetine (PROZAC) 20 MG tablet; Take 1 tablet (20 mg total) by mouth daily.  Dispense: 30 tablet; Refill: 3 ? ?2. Insomnia, unspecified type: ?- Continue Trazodone as prescribed. Counseled on medication adherence and adverse effects.  ?- Follow-up with primary provider in 4 months or sooner if needed.  ?- traZODone  (DESYREL) 150 MG tablet; Take 0.5 tablets (75 mg total) by mouth at bedtime.  Dispense: 30 tablet; Refill: 1 ? ?3. Essential (primary) hypertension: ?- Continue Amlodipine as prescribed. Counseled on medication adherence and adverse effects.  ?- Follow-up with primary provider in 4 months or sooner if needed.  ?- amLODipine (NORVASC) 5 MG tablet; Take 1 tablet (5 mg total) by mouth daily.  Dispense: 30 tablet; Refill: 3 ? ?4. Hyperlipidemia, unspecified hyperlipidemia type: ?- Continue Atorvastatin as prescribed. Counseled on medication adherence and adverse effects. ?- Follow-up with primary provider in 4 months or sooner if needed.  ?- atorvastatin (LIPITOR)  80 MG tablet; Take 1 tablet (80 mg total) by mouth daily.  Dispense: 90 tablet; Refill: 1 ? ? ? ?Patient was given the opportunity to ask questions.  Patient verbalized understanding of the plan and was able to repeat key elements of the plan. Patient was given clear instructions to go to Emergency Department or return to medical center if symptoms don't improve, worsen, or new problems develop.The patient verbalized understanding. ? ? ?Requested Prescriptions  ? ?Signed Prescriptions Disp Refills  ? FLUoxetine (PROZAC) 20 MG tablet 30 tablet 3  ?  Sig: Take 1 tablet (20 mg total) by mouth daily.  ? traZODone (DESYREL) 150 MG tablet 30 tablet 1  ?  Sig: Take 0.5 tablets (75 mg total) by mouth at bedtime.  ? amLODipine (NORVASC) 5 MG tablet 30 tablet 3  ?  Sig: Take 1 tablet (5 mg total) by mouth daily.  ? atorvastatin (LIPITOR) 80 MG tablet 90 tablet 1  ?  Sig: Take 1 tablet (80 mg total) by mouth daily.  ? ? ?Return in about 4 months (around 04/16/2022) for Follow-Up or next available HTN, anxiety depression . ? ?Camillia Herter, NP  ?

## 2021-12-15 ENCOUNTER — Ambulatory Visit (INDEPENDENT_AMBULATORY_CARE_PROVIDER_SITE_OTHER): Payer: Self-pay | Admitting: Family

## 2021-12-15 ENCOUNTER — Encounter: Payer: Self-pay | Admitting: Family

## 2021-12-15 VITALS — BP 115/70 | HR 63 | Temp 98.3°F | Resp 18 | Ht 65.98 in | Wt 136.0 lb

## 2021-12-15 DIAGNOSIS — I10 Essential (primary) hypertension: Secondary | ICD-10-CM

## 2021-12-15 DIAGNOSIS — G4700 Insomnia, unspecified: Secondary | ICD-10-CM

## 2021-12-15 DIAGNOSIS — F419 Anxiety disorder, unspecified: Secondary | ICD-10-CM

## 2021-12-15 DIAGNOSIS — F32A Depression, unspecified: Secondary | ICD-10-CM

## 2021-12-15 DIAGNOSIS — E785 Hyperlipidemia, unspecified: Secondary | ICD-10-CM

## 2021-12-15 MED ORDER — TRAZODONE HCL 150 MG PO TABS: 75.0000 mg | ORAL_TABLET | Freq: Every day | ORAL | 1 refills | Status: DC

## 2021-12-15 MED ORDER — FLUOXETINE HCL 20 MG PO TABS
20.0000 mg | ORAL_TABLET | Freq: Every day | ORAL | 3 refills | Status: DC
Start: 2021-12-15 — End: 2022-09-22

## 2021-12-15 MED ORDER — AMLODIPINE BESYLATE 5 MG PO TABS: 5.0000 mg | ORAL_TABLET | Freq: Every day | ORAL | 3 refills | Status: DC

## 2021-12-15 MED ORDER — ATORVASTATIN CALCIUM 80 MG PO TABS: 80.0000 mg | ORAL_TABLET | Freq: Every day | ORAL | 1 refills | Status: DC

## 2021-12-15 NOTE — Progress Notes (Signed)
Pt presents for anxiety/depression follow-up  ?

## 2021-12-15 NOTE — Patient Instructions (Signed)
Fluoxetine Capsules or Tablets (Depression/Mood Disorders) ?What is this medication? ?FLUOXETINE (floo OX e teen) treats depression, anxiety, obsessive-compulsive disorder (OCD), and eating disorders. It increases the amount of serotonin in the brain, a hormone that helps regulate mood. It belongs to a group of medications called SSRIs. ?This medicine may be used for other purposes; ask your health care provider or pharmacist if you have questions. ?COMMON BRAND NAME(S): Prozac ?What should I tell my care team before I take this medication? ?They need to know if you have any of these conditions: ?Bipolar disorder or a family history of bipolar disorder ?Bleeding disorders ?Glaucoma ?Heart disease ?Liver disease ?Low levels of sodium in the blood ?Seizures ?Suicidal thoughts, plans, or attempt; a previous suicide attempt by you or a family member ?Take MAOIs like Carbex, Eldepryl, Marplan, Nardil, and Parnate ?Take medications that treat or prevent blood clots ?Thyroid disease ?An unusual or allergic reaction to fluoxetine, other medications, foods, dyes, or preservatives ?Pregnant or trying to get pregnant ?Breast-feeding ?How should I use this medication? ?Take this medication by mouth with a glass of water. Follow the directions on the prescription label. You can take this medication with or without food. Take your medication at regular intervals. Do not take it more often than directed. Do not stop taking this medication suddenly except upon the advice of your care team. Stopping this medication too quickly may cause serious side effects or your condition may worsen. ?A special MedGuide will be given to you by the pharmacist with each prescription and refill. Be sure to read this information carefully each time. ?Talk to your care team about the use of this medication in children. While it may be prescribed for children as young as 7 years for selected conditions, precautions do apply. ?Overdosage: If you think  you have taken too much of this medicine contact a poison control center or emergency room at once. ?NOTE: This medicine is only for you. Do not share this medicine with others. ?What if I miss a dose? ?If you miss a dose, skip the missed dose and go back to your regular dosing schedule. Do not take double or extra doses. ?What may interact with this medication? ?Do not take this medication with any of the following: ?Other medications containing fluoxetine, like Sarafem or Symbyax ?Cisapride ?Dronedarone ?Linezolid ?MAOIs like Carbex, Eldepryl, Marplan, Nardil, and Parnate ?Methylene blue (injected into a vein) ?Pimozide ?Thioridazine ?This medication may also interact with the following: ?Alcohol ?Amphetamines ?Aspirin and aspirin-like medications ?Carbamazepine ?Certain medications for depression, anxiety, or psychotic disturbances ?Certain medications for migraine headaches like almotriptan, eletriptan, frovatriptan, naratriptan, rizatriptan, sumatriptan, zolmitriptan ?Digoxin ?Diuretics ?Fentanyl ?Flecainide ?Furazolidone ?Isoniazid ?Lithium ?Medications for sleep ?Medications that treat or prevent blood clots like warfarin, enoxaparin, and dalteparin ?NSAIDs, medications for pain and inflammation, like ibuprofen or naproxen ?Other medications that prolong the QT interval (an abnormal heart rhythm) ?Phenytoin ?Procarbazine ?Propafenone ?Rasagiline ?Ritonavir ?Supplements like St. John's wort, kava kava, valerian ?Tramadol ?Tryptophan ?Vinblastine ?This list may not describe all possible interactions. Give your health care provider a list of all the medicines, herbs, non-prescription drugs, or dietary supplements you use. Also tell them if you smoke, drink alcohol, or use illegal drugs. Some items may interact with your medicine. ?What should I watch for while using this medication? ?Tell your care team if your symptoms do not get better or if they get worse. Visit your care team for regular checks on your  progress. Because it may take several weeks to see the  full effects of this medication, it is important to continue your treatment as prescribed. ?Watch for new or worsening thoughts of suicide or depression. This includes sudden changes in mood, behavior, or thoughts. These changes can happen at any time but are more common in the beginning of treatment or after a change in dose. Call your care team right away if you experience these thoughts or worsening depression. ?Manic episodes may happen in patients with bipolar disorder who take this medication. Watch for changes in feelings or behaviors such as feeling anxious, nervous, agitated, panicky, irritable, hostile, aggressive, impulsive, severely restless, overly excited and hyperactive, or trouble sleeping. These symptoms can happen at anytime but are more common in the beginning of treatment or after a change in dose. Call your care team right away if you notice any of these symptoms. ?You may get drowsy or dizzy. Do not drive, use machinery, or do anything that needs mental alertness until you know how this medication affects you. Do not stand or sit up quickly, especially if you are an older patient. This reduces the risk of dizzy or fainting spells. Alcohol may interfere with the effect of this medication. Avoid alcoholic drinks. ?Your mouth may get dry. Chewing sugarless gum or sucking hard candy, and drinking plenty of water may help. Contact your care team if the problem does not go away or is severe. ?This medication may affect blood sugar levels. If you have diabetes, check with your care team before you make changes to your diet or medications. ?What side effects may I notice from receiving this medication? ?Side effects that you should report to your care team as soon as possible: ?Allergic reactions--skin rash, itching, hives, swelling of the face, lips, tongue, or throat ?Bleeding--bloody or black, tar-like stools, red or dark brown urine, vomiting  blood or brown material that looks like coffee grounds, small, red or purple spots on skin, unusual bleeding or bruising ?Heart rhythm changes--fast or irregular heartbeat, dizziness, feeling faint or lightheaded, chest pain, trouble breathing ?Loss of appetite with weight loss ?Low sodium level--muscle weakness, fatigue, dizziness, headache, confusion ?Serotonin syndrome--irritability, confusion, fast or irregular heartbeat, muscle stiffness, twitching muscles, sweating, high fever, seizure, chills, vomiting, diarrhea ?Sudden eye pain or change in vision such as blurry vision, seeing halos around lights, vision loss ?Thoughts of suicide or self-harm, worsening mood, feelings of depression ?Side effects that usually do not require medical attention (report to your care team if they continue or are bothersome): ?Anxiety, nervousness ?Change in sex drive or performance ?Diarrhea ?Dry mouth ?Headache ?Excessive sweating ?Nausea ?Tremors or shaking ?Trouble sleeping ?Upset stomach ?This list may not describe all possible side effects. Call your doctor for medical advice about side effects. You may report side effects to FDA at 1-800-FDA-1088. ?Where should I keep my medication? ?Keep out of the reach of children and pets. ?Store at room temperature between 15 and 30 degrees C (59 and 86 degrees F). Get rid of any unused medication after the expiration date. ?NOTE: This sheet is a summary. It may not cover all possible information. If you have questions about this medicine, talk to your doctor, pharmacist, or health care provider. ?? 2023 Elsevier/Gold Standard (2020-10-23 00:00:00) ? ?

## 2022-01-08 ENCOUNTER — Other Ambulatory Visit: Payer: Self-pay | Admitting: Family

## 2022-01-08 DIAGNOSIS — G4700 Insomnia, unspecified: Secondary | ICD-10-CM

## 2022-01-08 NOTE — Telephone Encounter (Signed)
Requested medication (s) are due for refill today: historical medication, requesting new medication   Requested medication (s) are on the active medication list: yes, plavix, trazadone, no losartan- HCTZ 0.5 tablet  Last refill:  trazadone- 12/15/21-04/14/22 #30 1 refill  Future visit scheduled: yes in 3 months  Notes to clinic:  requesting new medication losartan- HCTZ 0.5 tablet, historical medication- plavix. Do you want to order Rx? Marland Kitchen      Requested Prescriptions  Pending Prescriptions Disp Refills   clopidogrel (PLAVIX) 75 MG tablet      Sig: Take 1 tablet (75 mg total) by mouth daily.     Hematology: Antiplatelets - clopidogrel Passed - 01/08/2022 10:31 AM      Passed - HCT in normal range and within 180 days    HCT  Date Value Ref Range Status  10/15/2021 42.8 36.0 - 46.0 % Final         Passed - HGB in normal range and within 180 days    Hemoglobin  Date Value Ref Range Status  10/15/2021 14.2 12.0 - 15.0 g/dL Final         Passed - PLT in normal range and within 180 days    Platelets  Date Value Ref Range Status  10/15/2021 189 150 - 400 K/uL Final         Passed - Cr in normal range and within 360 days    Creatinine, Ser  Date Value Ref Range Status  10/15/2021 0.57 0.44 - 1.00 mg/dL Final         Passed - Valid encounter within last 6 months    Recent Outpatient Visits           3 weeks ago Anxiety and depression   [facility] at [facility], Amy J, NP   1 month ago Anxiety and depression   Primary Care at [facility], Kriste Basque, NP       Future Appointments             In 3 months Camillia Herter, NP [facility] at [facility]              traZODone (DESYREL) 150 MG tablet 30 tablet 1    Sig: Take 0.5 tablets (75 mg total) by mouth at bedtime.     Psychiatry: Antidepressants - Serotonin Modulator Passed - 01/08/2022 10:31 AM      Passed - Completed PHQ-2 or PHQ-9 in the last 360 days      Passed - Valid  encounter within last 6 months    Recent Outpatient Visits           3 weeks ago Anxiety and depression   [facility] at [facility], Amy J, NP   1 month ago Anxiety and depression   Primary Care at [facility], Kriste Basque, NP       Future Appointments             In 3 months Camillia Herter, NP Primary Care at [facility]

## 2022-01-08 NOTE — Telephone Encounter (Signed)
Medication Refill - Medication: losartan-hydrochlorothiazide (HYZAAR) 50-12.5 MG per tablet 0.5 tablet, clopidogrel (PLAVIX) 75 MG tablet traZODone (DESYREL) 150 MG tablet  Has the patient contacted their pharmacy? Yes.    Pt stated pharmacy faxed a form for refills but have not heard back.  (Agent: If yes, when and what did the pharmacy advise?) [facility], [address] [place] [address]  Phone: [phone] Fax: [phone]  Hours: Not open 24 hours   Preferred Pharmacy (with phone number or street name):  Has the patient been seen for an appointment in the last year OR does the patient have an upcoming appointment? Yes.    Agent: Please be advised that RX refills may take up to 3 business days. We ask that you follow-up with your pharmacy.

## 2022-01-09 ENCOUNTER — Ambulatory Visit
Admission: EM | Admit: 2022-01-09 | Discharge: 2022-01-09 | Disposition: A | Discharge status: Home / Self Care | Payer: Self-pay

## 2022-01-09 ENCOUNTER — Other Ambulatory Visit: Payer: Self-pay | Admitting: Family

## 2022-01-09 DIAGNOSIS — E785 Hyperlipidemia, unspecified: Secondary | ICD-10-CM

## 2022-01-09 DIAGNOSIS — I10 Essential (primary) hypertension: Secondary | ICD-10-CM

## 2022-01-09 DIAGNOSIS — Z760 Encounter for issue of repeat prescription: Secondary | ICD-10-CM

## 2022-01-09 DIAGNOSIS — G4700 Insomnia, unspecified: Secondary | ICD-10-CM

## 2022-01-09 DIAGNOSIS — F419 Anxiety disorder, unspecified: Secondary | ICD-10-CM

## 2022-01-09 DIAGNOSIS — F32A Depression, unspecified: Secondary | ICD-10-CM

## 2022-01-09 NOTE — ED Provider Notes (Signed)
[facility]    CSN: [id number] Arrival date & time: 01/09/22  1216      History   Chief Complaint Chief Complaint  Patient presents with   Medication Refill    HPI Dorothy [name] is a 66 y.o. female.   Patient presents for medication refill for amlodipine, losartan, Plavix, trazodone, Lipitor, Prozac.  Patient originally stated that she had been taking amlodipine for multiple years and ran out about 3 days ago.  Then, patient reported that she has not taken amlodipine or losartan for multiple years but she felt like her blood pressure was high, so she started taking leftover amlodipine and losartan approximately 3 days ago.  She had her blood pressure checked at pharmacy and it was 858 systolic.  Patient states that she started taking Plavix and Lipitor about 1 month ago after being told she had a "mini stroke" at hospitalization.  Patient states that she started having changes in her vision at the time of her mini stroke, so she went to the ER.  She has been taking trazodone for multiple years.  Patient reports that she attempted to get medication refills from her primary care doctor but was unable to get them.  Last saw PCP 1 month ago.    Medication Refill  Past Medical History:  Diagnosis Date   Alcoholic ([place])    Chronic bronchitis ([place])    Still smoking   Depression    GERD (gastroesophageal reflux disease)    Hoarseness 06/2010   Oral thrush---- Dr. 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      Home Medications    Prior to Admission medications   Medication Sig Start Date End Date Taking? Authorizing Provider  acetaminophen (TYLENOL) 500 MG tablet Take 500 mg by mouth every 6 (six) hours as needed for mild pain or moderate pain. Patient not taking: Reported on 11/14/2021    [provider]  amLODipine (NORVASC) 5 MG tablet Take 1 tablet (5 mg total) by mouth daily. 12/15/21 04/14/22  Camillia Herter, NP  aspirin EC 81 MG EC tablet Take 1 tablet (81 mg total) by mouth daily. Swallow whole. Patient not taking: Reported on 11/14/2021 10/17/21   Annita Brod, MD  atorvastatin (LIPITOR) 80 MG tablet Take 1 tablet (80 mg total) by mouth daily. 12/15/21 06/13/22  Camillia Herter, NP  clopidogrel (PLAVIX) 75 MG tablet Take 75 mg by mouth daily.    [provider]  FLUoxetine (PROZAC) 20 MG tablet Take 1 tablet (20 mg total)  by mouth daily. 12/15/21 04/14/22  Camillia Herter, NP  traZODone (DESYREL) 150 MG tablet Take 0.5 tablets (75 mg total) by mouth at bedtime. 12/15/21 04/14/22  Camillia Herter, NP    Family History Family History  Problem Relation Age of Onset   Cirrhosis Father     Social History Social History   Tobacco Use   Smoking status: Every Day    Packs/day: 1.50    Types: Cigarettes    Start date: 08/24/1972    Passive exposure: Current   Smokeless tobacco: Never  Vaping Use   Vaping Use: Never used  Substance Use Topics   Alcohol use: Yes    Comment: >1 pint vodka daily and wine   Drug use: Yes    Types: Marijuana      Allergies   Patient has no known allergies.   Review of Systems Review of Systems Per HPI  Physical Exam Triage Vital Signs ED Triage Vitals  Enc Vitals Group     BP 01/09/22 1325 125/84     Pulse Rate 01/09/22 1325 100     Resp 01/09/22 1325 18     Temp 01/09/22 1325 98.2 F (36.8 C)     Temp Source 01/09/22 1325 Oral     SpO2 01/09/22 1325 95 %     Weight --      Height --      Head Circumference --      Peak Flow --      Pain Score 01/09/22 1323 0     Pain Loc --      Pain Edu? --      Excl. in [place]? --    No data found.  Updated Vital Signs BP 125/84 (BP Location: Right Arm) Comment: has not had blood pressure meds in 3 days  Pulse 100   Temp 98.2 F (36.8 C) (Oral)   Resp 18   SpO2 95%   Visual Acuity Right Eye Distance:   Left Eye Distance:   Bilateral Distance:    Right Eye Near:   Left Eye Near:    Bilateral Near:     Physical Exam Constitutional:      General: She is not in acute distress.    Appearance: Normal appearance. She is not toxic-appearing or diaphoretic.  HENT:     Head: Normocephalic and atraumatic.  Eyes:     Extraocular Movements: Extraocular movements intact.     Conjunctiva/sclera: Conjunctivae normal.  Cardiovascular:     Rate and Rhythm: Normal rate and regular rhythm.     Pulses: Normal pulses.     Heart sounds: Normal heart sounds.  Pulmonary:     Effort: Pulmonary effort is normal. No respiratory distress.     Breath sounds: Normal breath sounds.  Neurological:     General: No focal deficit present.     Mental Status: She is alert and oriented to person, place, and time. Mental status is at baseline.  Psychiatric:        Mood and Affect: Mood normal.        Behavior: Behavior normal.        Thought Content: Thought content normal.        Judgment: Judgment normal.     UC Treatments / Results  Labs (all labs ordered are listed, but only abnormal results are displayed) Labs Reviewed - No data to  display  EKG   Radiology No results found.  Procedures Procedures (including critical care time)  Medications Ordered  in UC Medications - No data to display  Initial Impression / Assessment and Plan / UC Course  I have reviewed the triage vital signs and the nursing notes.  Pertinent labs & imaging results that were available during my care of the patient were reviewed by me and considered in my medical decision making (see chart for details).     Due to inconsistencies in patient's story and what medications she takes, I consulted patient's PCP in person to discuss what medications need to be refilled. Patient's PCP stated that she was seen 1 month ago and amlodipine, Prozac, trazodone, Lipitor were all refilled and should be at pharmacy.  I called pharmacy and this was confirmed.  Pharmacy reported that she did not take any losartan since before 2021.  Plavix was on patient's medication list per pharmacy, but no refills have been sent in.    Per patient's PCP: "Will not add Losartan at this time as recent blood pressure on 12/15/21 normal. Recommendation to pickup refills available at pharmacy per your list. After further review patient is established with Barrie Dunker, MD at Neurology. Her last appointment with them was on 10/16/2021 and counseled to follow-up at stroke clinic located at [facility] in 4 weeks. It doesn't seem she made it to that appointment. She should request refills of Plavix from Neurology. Follow-up with me with any additional issues/concerns."  Patient was advised of this information.  Patient was given strict return and ER precautions.  Patient verbalized understanding and was agreeable with plan. Final Clinical Impressions(s) / UC Diagnoses   Final diagnoses:  Encounter for medication refill  Essential hypertension  Anxiety and depression  Insomnia, unspecified type  Hyperlipidemia, unspecified hyperlipidemia type     Discharge Instructions      Amlodipine,  Prozac, Lipitor, trazodone are at the pharmacy awaiting pickup.  These were already prescribed by your primary care doctor at previous visit.  Your primary care doctor recommended that you follow-up with neurology for any refills on Plavix.  She does not want you to take the losartan at this time.     ED Prescriptions   None    PDMP not reviewed this encounter.   Teodora Medici, [place] 01/09/22 1409

## 2022-01-09 NOTE — Telephone Encounter (Signed)
Patient is established with [name], MD at Neurology. Her last appointment was on 10/16/2021 and counseled to follow-up at stroke clinic located at [facility] in 4 weeks. It doesn't seem she made it to that appointment. Make patient aware to request refills of Plavix from Neurology.

## 2022-01-09 NOTE — Discharge Instructions (Addendum)
Amlodipine, Prozac, Lipitor, trazodone are at the pharmacy awaiting pickup.  These were already prescribed by your primary care doctor at previous visit.  Your primary care doctor recommended that you follow-up with neurology for any refills on Plavix.  She does not want you to take the losartan at this time.

## 2022-01-09 NOTE — ED Triage Notes (Addendum)
Pt presents for medication refill of amlodipine, plavix, trazadone, and prozac; pt attempted to get a refill with her PCP but there was a confusion with the medications and getting the refills.  Pt has not had blood pressure medication in 3 days.

## 2022-01-09 NOTE — Telephone Encounter (Signed)
Pt stated she was seen at [facility] at [facility] and was advised they couldn't not refill the medication clopidogrel (PLAVIX) 75 MG tablet because she needs a neurologist.    Pt stated she does not have a neurologist.  Please advise. Pt is requesting a call back.

## 2022-04-11 NOTE — Progress Notes (Signed)
Erroneous encounter-disregard

## 2022-04-20 ENCOUNTER — Encounter: Payer: Self-pay | Admitting: Family

## 2022-04-20 DIAGNOSIS — Z131 Encounter for screening for diabetes mellitus: Secondary | ICD-10-CM

## 2022-04-20 DIAGNOSIS — I10 Essential (primary) hypertension: Secondary | ICD-10-CM

## 2022-09-15 ENCOUNTER — Emergency Department (HOSPITAL_COMMUNITY)
Admission: EM | Admit: 2022-09-15 | Discharge: 2022-09-15 | Disposition: A | Discharge status: Other Acute Inpatient Hospital | Payer: 59 | Attending: Emergency Medicine | Admitting: Emergency Medicine

## 2022-09-15 ENCOUNTER — Other Ambulatory Visit: Payer: Self-pay

## 2022-09-15 ENCOUNTER — Inpatient Hospital Stay
Admission: AD | Admit: 2022-09-15 | Discharge: 2022-09-22 | DRG: 885 | Disposition: A | Discharge status: Home / Self Care | Payer: 59 | Source: Intra-hospital | Attending: Psychiatry | Admitting: Psychiatry

## 2022-09-15 DIAGNOSIS — F332 Major depressive disorder, recurrent severe without psychotic features: Secondary | ICD-10-CM | POA: Diagnosis present

## 2022-09-15 DIAGNOSIS — Z85820 Personal history of malignant melanoma of skin: Secondary | ICD-10-CM | POA: Insufficient documentation

## 2022-09-15 DIAGNOSIS — F17210 Nicotine dependence, cigarettes, uncomplicated: Secondary | ICD-10-CM | POA: Diagnosis present

## 2022-09-15 DIAGNOSIS — I10 Essential (primary) hypertension: Secondary | ICD-10-CM | POA: Diagnosis present

## 2022-09-15 DIAGNOSIS — R64 Cachexia: Secondary | ICD-10-CM | POA: Diagnosis present

## 2022-09-15 DIAGNOSIS — Z1152 Encounter for screening for COVID-19: Secondary | ICD-10-CM | POA: Insufficient documentation

## 2022-09-15 DIAGNOSIS — F419 Anxiety disorder, unspecified: Secondary | ICD-10-CM | POA: Diagnosis present

## 2022-09-15 DIAGNOSIS — Z7902 Long term (current) use of antithrombotics/antiplatelets: Secondary | ICD-10-CM | POA: Diagnosis not present

## 2022-09-15 DIAGNOSIS — B001 Herpesviral vesicular dermatitis: Secondary | ICD-10-CM | POA: Diagnosis present

## 2022-09-15 DIAGNOSIS — Z638 Other specified problems related to primary support group: Secondary | ICD-10-CM | POA: Diagnosis not present

## 2022-09-15 DIAGNOSIS — F1020 Alcohol dependence, uncomplicated: Secondary | ICD-10-CM | POA: Diagnosis present

## 2022-09-15 DIAGNOSIS — Y908 Blood alcohol level of 240 mg/100 ml or more: Secondary | ICD-10-CM | POA: Diagnosis present

## 2022-09-15 DIAGNOSIS — R45851 Suicidal ideations: Secondary | ICD-10-CM | POA: Diagnosis present

## 2022-09-15 DIAGNOSIS — K219 Gastro-esophageal reflux disease without esophagitis: Secondary | ICD-10-CM | POA: Diagnosis present

## 2022-09-15 DIAGNOSIS — G4700 Insomnia, unspecified: Secondary | ICD-10-CM

## 2022-09-15 DIAGNOSIS — Z79899 Other long term (current) drug therapy: Secondary | ICD-10-CM

## 2022-09-15 DIAGNOSIS — Z85828 Personal history of other malignant neoplasm of skin: Secondary | ICD-10-CM | POA: Diagnosis not present

## 2022-09-15 DIAGNOSIS — F1024 Alcohol dependence with alcohol-induced mood disorder: Secondary | ICD-10-CM | POA: Insufficient documentation

## 2022-09-15 DIAGNOSIS — Z6821 Body mass index (BMI) 21.0-21.9, adult: Secondary | ICD-10-CM

## 2022-09-15 DIAGNOSIS — Z7901 Long term (current) use of anticoagulants: Secondary | ICD-10-CM | POA: Insufficient documentation

## 2022-09-15 LAB — RAPID URINE DRUG SCREEN, HOSP PERFORMED
Amphetamines: NOT DETECTED
Barbiturates: NOT DETECTED
Benzodiazepines: NOT DETECTED
Cocaine: NOT DETECTED
Opiates: NOT DETECTED
Tetrahydrocannabinol: NOT DETECTED

## 2022-09-15 LAB — CBC WITH DIFFERENTIAL/PLATELET
Abs Immature Granulocytes: 0.02 10*3/uL (ref 0.00–0.07)
Basophils Absolute: 0.1 10*3/uL (ref 0.0–0.1)
Basophils Relative: 1 %
Eosinophils Absolute: 0.1 10*3/uL (ref 0.0–0.5)
Eosinophils Relative: 1 %
HCT: 48.3 % — ABNORMAL HIGH (ref 36.0–46.0)
Hemoglobin: 16.6 g/dL — ABNORMAL HIGH (ref 12.0–15.0)
Immature Granulocytes: 0 %
Lymphocytes Relative: 32 %
Lymphs Abs: 2.1 10*3/uL (ref 0.7–4.0)
MCH: 33.1 pg (ref 26.0–34.0)
MCHC: 34.4 g/dL (ref 30.0–36.0)
MCV: 96.2 fL (ref 80.0–100.0)
Monocytes Absolute: 0.7 10*3/uL (ref 0.1–1.0)
Monocytes Relative: 10 %
Neutro Abs: 3.7 10*3/uL (ref 1.7–7.7)
Neutrophils Relative %: 56 %
Platelets: 214 10*3/uL (ref 150–400)
RBC: 5.02 MIL/uL (ref 3.87–5.11)
RDW: 14.8 % (ref 11.5–15.5)
WBC: 6.6 10*3/uL (ref 4.0–10.5)
nRBC: 0 % (ref 0.0–0.2)

## 2022-09-15 LAB — COMPREHENSIVE METABOLIC PANEL
ALT: 28 U/L (ref 0–44)
AST: 44 U/L — ABNORMAL HIGH (ref 15–41)
Albumin: 4.2 g/dL (ref 3.5–5.0)
Alkaline Phosphatase: 78 U/L (ref 38–126)
Anion gap: 14 (ref 5–15)
BUN: 12 mg/dL (ref 8–23)
CO2: 22 mmol/L (ref 22–32)
Calcium: 8.7 mg/dL — ABNORMAL LOW (ref 8.9–10.3)
Chloride: 103 mmol/L (ref 98–111)
Creatinine, Ser: 0.55 mg/dL (ref 0.44–1.00)
GFR, Estimated: >60 mL/min (ref >60)
Glucose, Bld: 91 mg/dL (ref 70–99)
Potassium: 4 mmol/L (ref 3.5–5.1)
Sodium: 139 mmol/L (ref 135–145)
Total Bilirubin: 0.6 mg/dL (ref 0.3–1.2)
Total Protein: 7.4 g/dL (ref 6.5–8.1)

## 2022-09-15 LAB — RESP PANEL BY RT-PCR (RSV, FLU A&B, COVID)  RVPGX2
Influenza A by PCR: NEGATIVE
Influenza B by PCR: NEGATIVE
Resp Syncytial Virus by PCR: NEGATIVE
SARS Coronavirus 2 by RT PCR: NEGATIVE

## 2022-09-15 LAB — ACETAMINOPHEN LEVEL: Acetaminophen (Tylenol), Serum: <10 ug/mL — ABNORMAL LOW (ref 10–30)

## 2022-09-15 LAB — ETHANOL: Alcohol, Ethyl (B): 313 mg/dL (ref <10)

## 2022-09-15 LAB — SALICYLATE LEVEL: Salicylate Lvl: <7 mg/dL — ABNORMAL LOW (ref 7.0–30.0)

## 2022-09-15 MED ORDER — OLANZAPINE 5 MG PO TABS: 10.0000 mg | ORAL_TABLET | Freq: Four times a day (QID) | ORAL | Status: DC | PRN

## 2022-09-15 MED ORDER — FLUOXETINE HCL 20 MG PO CAPS
20.0000 mg | ORAL_CAPSULE | Freq: Every day | ORAL | Status: DC
  Administered 2022-09-15 – 2022-09-22 (×8): 20 mg via ORAL
  Filled 2022-09-15 (×8): qty 1

## 2022-09-15 MED ORDER — MIRTAZAPINE 15 MG PO TBDP
15.0000 mg | ORAL_TABLET | Freq: Every day | ORAL | Status: DC
  Administered 2022-09-15: 15 mg via ORAL
  Filled 2022-09-15: qty 1

## 2022-09-15 MED ORDER — ACETAMINOPHEN 325 MG PO TABS: 650.0000 mg | ORAL_TABLET | Freq: Four times a day (QID) | ORAL | Status: DC | PRN

## 2022-09-15 MED ORDER — LORAZEPAM 1 MG PO TABS: 0.0000 mg | ORAL_TABLET | Freq: Two times a day (BID) | ORAL | Status: DC

## 2022-09-15 MED ORDER — AMLODIPINE BESYLATE 5 MG PO TABS
5.0000 mg | ORAL_TABLET | Freq: Every day | ORAL | Status: DC
  Administered 2022-09-15 – 2022-09-16 (×2): 5 mg via ORAL
  Filled 2022-09-15 (×2): qty 1

## 2022-09-15 MED ORDER — CLOPIDOGREL BISULFATE 75 MG PO TABS
75.0000 mg | ORAL_TABLET | Freq: Every day | ORAL | Status: DC
  Administered 2022-09-16 – 2022-09-22 (×7): 75 mg via ORAL
  Filled 2022-09-15 (×7): qty 1

## 2022-09-15 MED ORDER — ATORVASTATIN CALCIUM 80 MG PO TABS
80.0000 mg | ORAL_TABLET | Freq: Every day | ORAL | Status: DC
  Administered 2022-09-15 – 2022-09-21 (×7): 80 mg via ORAL
  Filled 2022-09-15 (×7): qty 1

## 2022-09-15 MED ORDER — MAGNESIUM HYDROXIDE 400 MG/5ML PO SUSP: 30.0000 mL | Freq: Every day | ORAL | Status: DC | PRN

## 2022-09-15 MED ORDER — LORAZEPAM 1 MG PO TABS
0.0000 mg | ORAL_TABLET | Freq: Four times a day (QID) | ORAL | Status: DC
  Administered 2022-09-15 (×2): 1 mg via ORAL
  Filled 2022-09-15 (×2): qty 1

## 2022-09-15 MED ORDER — LORAZEPAM 2 MG/ML IJ SOLN: 0.0000 mg | Freq: Two times a day (BID) | INTRAMUSCULAR | Status: DC

## 2022-09-15 MED ORDER — THIAMINE MONONITRATE 100 MG PO TABS
100.0000 mg | ORAL_TABLET | Freq: Every day | ORAL | Status: DC
  Administered 2022-09-16 – 2022-09-19 (×4): 100 mg via ORAL
  Filled 2022-09-15 (×4): qty 1

## 2022-09-15 MED ORDER — DIAZEPAM 5 MG PO TABS
5.0000 mg | ORAL_TABLET | Freq: Two times a day (BID) | ORAL | Status: DC
  Administered 2022-09-15 – 2022-09-19 (×8): 5 mg via ORAL
  Filled 2022-09-15 (×8): qty 1

## 2022-09-15 MED ORDER — ASPIRIN 81 MG PO TBEC
81.0000 mg | DELAYED_RELEASE_TABLET | Freq: Every day | ORAL | Status: DC
  Administered 2022-09-16 – 2022-09-22 (×7): 81 mg via ORAL
  Filled 2022-09-15 (×7): qty 1

## 2022-09-15 MED ORDER — THIAMINE HCL 100 MG/ML IJ SOLN: 100.0000 mg | Freq: Every day | INTRAMUSCULAR | Status: DC

## 2022-09-15 MED ORDER — LORAZEPAM 2 MG/ML IJ SOLN: 0.0000 mg | Freq: Four times a day (QID) | INTRAMUSCULAR | Status: AC

## 2022-09-15 MED ORDER — ALUM & MAG HYDROXIDE-SIMETH 200-200-20 MG/5ML PO SUSP: 30.0000 mL | ORAL | Status: DC | PRN

## 2022-09-15 MED ORDER — TRAZODONE HCL 100 MG PO TABS
100.0000 mg | ORAL_TABLET | Freq: Every day | ORAL | Status: DC
  Administered 2022-09-15: 100 mg via ORAL
  Filled 2022-09-15: qty 1

## 2022-09-15 MED ORDER — LORAZEPAM 1 MG PO TABS: 1.0000 mg | ORAL_TABLET | Freq: Once | ORAL | Status: DC

## 2022-09-15 MED ORDER — LORAZEPAM 1 MG PO TABS
0.0000 mg | ORAL_TABLET | Freq: Four times a day (QID) | ORAL | Status: AC
  Administered 2022-09-15: 2 mg via ORAL
  Administered 2022-09-16 (×2): 1 mg via ORAL
  Filled 2022-09-15 (×2): qty 1
  Filled 2022-09-15: qty 2

## 2022-09-15 MED ORDER — LORAZEPAM 2 MG/ML IJ SOLN: 0.0000 mg | Freq: Four times a day (QID) | INTRAMUSCULAR | Status: DC

## 2022-09-15 MED ORDER — ONDANSETRON 4 MG PO TBDP
4.0000 mg | ORAL_TABLET | Freq: Once | ORAL | Status: AC
  Administered 2022-09-15: 4 mg via ORAL
  Filled 2022-09-15: qty 1

## 2022-09-15 MED ORDER — MIRTAZAPINE 15 MG PO TBDP
15.0000 mg | ORAL_TABLET | Freq: Every day | ORAL | Status: DC
  Filled 2022-09-15: qty 1

## 2022-09-15 MED ORDER — THIAMINE MONONITRATE 100 MG PO TABS
100.0000 mg | ORAL_TABLET | Freq: Every day | ORAL | Status: DC
  Administered 2022-09-15: 100 mg via ORAL
  Filled 2022-09-15: qty 1

## 2022-09-15 MED ORDER — NICOTINE 21 MG/24HR TD PT24
21.0000 mg | MEDICATED_PATCH | Freq: Every day | TRANSDERMAL | Status: DC
  Administered 2022-09-16 – 2022-09-22 (×7): 21 mg via TRANSDERMAL
  Filled 2022-09-15 (×7): qty 1

## 2022-09-15 MED ORDER — NICOTINE 21 MG/24HR TD PT24
21.0000 mg | MEDICATED_PATCH | Freq: Every day | TRANSDERMAL | Status: DC
  Administered 2022-09-15: 21 mg via TRANSDERMAL
  Filled 2022-09-15: qty 1

## 2022-09-15 NOTE — Progress Notes (Signed)
Pt was accepted to [facility] TODAY 09/15/2022, pending IVC paperwork faxed to [phone]. Bed assignment: 35  Pt meets inpatient criteria per Charmaine Downs, NP  Attending Physician will be Caren Griffins, DO  Report can be called to: [phone]  Bed is ready now  Care Team Notified: [facility] Lynnda Shields, RN, Charmaine Downs, NP, Izola Price, Paramedic, Graceann Congress, RN, and Nicoletta Ba, Counselor  [place], [place]  09/15/2022 1:35 PM

## 2022-09-15 NOTE — ED Triage Notes (Signed)
Pt arrives with [employer] with reports of suicidal ideation after she called for herself. Pt arrives and reports she has been feeling suicidal and does not want to live any more. Pt reports she has been unable to get a job and does not have anything to look forward to. Pt repetitively talking about not having anything to do. Pt reports daily ETOH.

## 2022-09-15 NOTE — Progress Notes (Signed)
Pt came in with hx of alcohol abuse, depression, and anxiety. Pt appears anxious however is cooperative. Skin assessment preformed upon admission. Pt has scratches bilat hands that are healing and OTA. Pt also has scabbing on Right hip and middle of back that is OTA and healing. Pt reports being tired and wanting to go to her room and lay down.

## 2022-09-15 NOTE — BH Assessment (Signed)
Clinician attempted to complete assessment, however when NT Caryl Pina took the cart into patient's room, patient stated "I don't want to do it. I'm tired, my throat hurts and I don't feel good." Patient continue laying in bed with the blanket up to her face.   Darra Lis, MSW, LCSW Triage Specialist

## 2022-09-15 NOTE — ED Notes (Signed)
Pt is ambulatory to restroom w/out assistance. Pt provided with specimen cup and requested urine sample. Pt also provided with burgundy scrubs and instructed to change out of her clothes and place them in pt belonging bags.

## 2022-09-15 NOTE — Progress Notes (Signed)
   09/15/22 2200  Psych Admission Type (Psych Patients Only)  Admission Status Voluntary  Psychosocial Assessment  Patient Complaints  (alcohol abuse)  Eye Contact Brief  Facial Expression Flat  Affect Sad  Speech Slow  Interaction Minimal  Motor Activity Other (Comment) (lying in the bed)  Appearance/Hygiene Disheveled  Behavior Characteristics Appropriate to situation  Mood Sad;Depressed  Aggressive Behavior  Effect No apparent injury  Thought Process  Coherency WDL  Content WDL  Delusions None reported or observed  Perception WDL  Hallucination None reported or observed  Judgment Impaired  Confusion None  Danger to Self  Current suicidal ideation? Denies  Danger to Others  Danger to Others None reported or observed

## 2022-09-15 NOTE — ED Notes (Signed)
Pt refusing TTS consult stating "it's a little too late at night for that" and "my throat hurts and I dont feel good" while keeping face covered with blanket. TTS provider on the line and witnessed same.

## 2022-09-15 NOTE — Group Note (Deleted)
LCSW Group Therapy Note   Group Date: 09/15/2022 Start Time:  1:00 PM End Time:  1:30 PM   Type of Therapy and Topic:  Group Therapy: Boundaries  Participation Level:  {BHH PARTICIPATION [id number]}  Description of Group: This group will address the use of boundaries in their personal lives. Patients will explore why boundaries are important, the difference between healthy and unhealthy boundaries, and negative and postive outcomes of different boundaries and will look at how boundaries can be crossed.  Patients will be encouraged to identify current boundaries in their own lives and identify what kind of boundary is being set. Facilitators will guide patients in utilizing problem-solving interventions to address and correct types boundaries being used and to address when no boundary is being used. Understanding and applying boundaries will be explored and addressed for obtaining and maintaining a balanced life. Patients will be encouraged to explore ways to assertively make their boundaries and needs known to significant others in their lives, using other group members and facilitator for role play, support, and feedback.  Therapeutic Goals:  1.  Patient will identify areas in their life where setting clear boundaries could be  used to improve their life.  2.  Patient will identify signs/triggers that a boundary is not being respected. 3.  Patient will identify two ways to set boundaries in order to achieve balance in  their lives: 4.  Patient will demonstrate ability to communicate their needs and set boundaries  through discussion and/or role plays  Summary of Patient Progress:  *** was ***present/active throughout the session and proved open to feedback from [place] and peers. Patient demonstrated *** insight into the subject matter, was respectful of peers, and was present throughout the entire session.  Therapeutic Modalities:   Cognitive Behavioral Therapy Solution-Focused  Therapy  Giannis Corpuz A [place], LCSWA 09/15/2022  1:37 PM

## 2022-09-15 NOTE — ED Notes (Signed)
Pt has one belonging bag located at triage nurses station. Pt has a chain type necklace and 3 rings included in her belongings with her clothing. Pt wanded by security.

## 2022-09-15 NOTE — ED Provider Notes (Signed)
Emergency Medicine Observation Re-evaluation Note  Dorothy [name] is a 67 y.o. female, seen on rounds today.  Pt initially presented to the ED for complaints of Suicidal Currently, the patient is resting.  Physical Exam  BP (!) 168/96   Pulse 96   Temp 97.9 F (36.6 C)   Resp 16   SpO2 97%  Physical Exam General: resting comfortably, NAD Lungs: normal WOB Psych: currently calm and resting  ED Course / MDM  EKG:   I have reviewed the labs performed to date as well as medications administered while in observation.  Recent changes in the last 24 hours include none.  Plan  Current plan is for TTS evaluation and recommendations.    Lorelle Gibbs, [place] 09/15/22 [phone]

## 2022-09-15 NOTE — ED Provider Notes (Signed)
[place] EMERGENCY DEPARTMENT AT [facility] Provider Note   CSN: [id number] Arrival date & time: 09/15/22  0301     History  Chief Complaint  Patient presents with   Suicidal    Dorothy [name] is a 67 y.o. female.  The history is provided by the patient and medical records.   67 y.o. F with hx of alcohol abuse, mood disorder, HTN, HLP, depression, insomnia, presenting to the ED with suicidal ideation.  She called 911 tonight for mental health issues, dispatch sent officer out who states upon arrival to the home she was in front of the house yelling that she wanted to kill herself, she had nothing to live for, etc.  States she does have a firearm in the house and was going to shoot herself in the head.  [employer] has taken out IVC.  Patient denies HI/AVH.    Home Medications Prior to Admission medications   Medication Sig Start Date End Date Taking? Authorizing Provider  acetaminophen (TYLENOL) 500 MG tablet Take 500 mg by mouth every 6 (six) hours as needed for mild pain or moderate pain. Patient not taking: Reported on 11/14/2021    [provider]  amLODipine (NORVASC) 5 MG tablet Take 1 tablet (5 mg total) by mouth daily. 12/15/21 04/14/22  Camillia Herter, NP  aspirin EC 81 MG EC tablet Take 1 tablet (81 mg total) by mouth daily. Swallow whole. Patient not taking: Reported on 11/14/2021 10/17/21   Annita Brod, MD  atorvastatin (LIPITOR) 80 MG tablet Take 1 tablet (80 mg total) by mouth daily. 12/15/21 06/13/22  Camillia Herter, NP  clopidogrel (PLAVIX) 75 MG tablet Take 75 mg by mouth daily.    [provider]  FLUoxetine (PROZAC) 20 MG tablet Take 1 tablet (20 mg total) by mouth daily. 12/15/21 04/14/22  Camillia Herter, NP  traZODone (DESYREL) 150 MG tablet Take 0.5 tablets (75 mg total) by mouth at bedtime. 12/15/21 04/14/22  Camillia Herter, NP      Allergies    Patient has no known allergies.    Review of Systems   Review of Systems  All  other systems reviewed and are negative.   Physical Exam Updated Vital Signs BP (!) 168/96 (BP Location: Left Arm)   Pulse 96   Temp 97.9 F (36.6 C)   Resp 16   SpO2 97%   Physical Exam Vitals and nursing note reviewed.  Constitutional:      Appearance: She is well-developed.     Comments: Agitated and yelling upon arrival but does calm when spoken to  HENT:     Head: Normocephalic and atraumatic.  Eyes:     Conjunctiva/sclera: Conjunctivae normal.     Pupils: Pupils are equal, round, and reactive to light.  Cardiovascular:     Rate and Rhythm: Normal rate and regular rhythm.     Heart sounds: Normal heart sounds.  Pulmonary:     Effort: Pulmonary effort is normal.     Breath sounds: Normal breath sounds.  Abdominal:     General: Bowel sounds are normal.     Palpations: Abdomen is soft.  Musculoskeletal:        General: Normal range of motion.     Cervical back: Normal range of motion.  Skin:    General: Skin is warm and dry.  Neurological:     Mental Status: She is alert and oriented to person, place, and time.  Psychiatric:  Comments: Voiced hopelessness, "nothing to live for anymore" SI with plan to shoot herself in the head or turn weapon on [employer] to have them shoot her Denies HI/AVH     ED Results / Procedures / Treatments   Labs (all labs ordered are listed, but only abnormal results are displayed) Labs Reviewed  CBC WITH DIFFERENTIAL/PLATELET - Abnormal; Notable for the following components:      Result Value   Hemoglobin 16.6 (*)    HCT 48.3 (*)    All other components within normal limits  COMPREHENSIVE METABOLIC PANEL - Abnormal; Notable for the following components:   Calcium 8.7 (*)    AST 44 (*)    All other components within normal limits  ETHANOL - Abnormal; Notable for the following components:   Alcohol, Ethyl (B) 313 (*)    All other components within normal limits  SALICYLATE LEVEL - Abnormal; Notable for the following components:    Salicylate Lvl <2.9 (*)    All other components within normal limits  ACETAMINOPHEN LEVEL - Abnormal; Notable for the following components:   Acetaminophen (Tylenol), Serum <10 (*)    All other components within normal limits  RESP PANEL BY RT-PCR (RSV, FLU A&B, COVID)  RVPGX2  RAPID URINE DRUG SCREEN, HOSP PERFORMED    EKG None  Radiology No results found.  Procedures Procedures    Medications Ordered in ED Medications - No data to display  ED Course/ Medical Decision Making/ A&P                             Medical Decision Making Amount and/or Complexity of Data Reviewed Labs: ordered. ECG/medicine tests: ordered and independent interpretation performed.  Risk OTC drugs. Prescription drug management.   67 year old female presenting to the ED under IVC petitioned by [employer].  She apparently called 911 today for "mental health issues" and upon their arrival she was in the front yard yelling that she was going to kill herself.  She does admit to having a firearm.  She was taken into custody and brought to the hospital.  She continues to express suicidal ideation.  She does appear intoxicated.  Labs were obtained--ethanol 313.  Labs otherwise largely reassuring.  RVP is negative.  Medically cleared.  Given her longstanding history of alcohol abuse, she was placed on CIWA protocol.  First exam completed and filed.  Awaiting TTS evaluation.  6:24 AM TTS attempted assessment earlier, however patient unwilling to cooperate.  They will try again later this morning.  Day team to follow-up on recommendations.  Final Clinical Impression(s) / ED Diagnoses Final diagnoses:  Suicidal ideation    Rx / DC Orders ED Discharge Orders     None         Larene Pickett, PA-C 09/15/22 4765    Randal Buba, [place], MD 09/15/22 [phone]

## 2022-09-15 NOTE — Consult Note (Cosign Needed Addendum)
[place] ED ASSESSMENT   Reason for Consult:  Psychiatry evaluation Referring Physician:  ER Physician Patient Identification: Dorothy [name] MRN:  [id number] ED Chief Complaint: Alcohol use disorder, severe, dependence ([place])  Diagnosis:  Principal Problem:   Alcohol use disorder, severe, dependence ([place]) Active Problems:   Severe recurrent major depression without psychotic features [facility])   ED Assessment Time Calculation: Start Time: 1041 Stop Time: 1103 Total Time in Minutes (Assessment Completion): 22   Subjective:   Dorothy [name] is a 67 y.o. female patient admitted with  significant hx of Alcohol dependence, anxiety disorder, Nicotine use disorder and Major Depressive disorder brought in by [facility]  with report of feeling suicidal in the context of her inability to stop drinking alcohol.  On arrival to the ER alcohol level was 313.  HPI:  Patient was seen this morning and she was tearful the entire assessment.  Per last night notes she was intoxicated, irritable and argumentative last night.  This morning she is calm, shaking, trembling and anxious.  She reports and rated Depression 10/10 with 10 being severe depression.  She reports feeling worthless, helpless and guilty of falling back on Alcohol after two long term Alcohol rehab treatment.  She also cannot drive due to two DUI that led to her  losing her License.  Patient reports started drinking at age 73 and smokes two packs of Cigarettes a day.She is estranged from her children due to alcohol use.  She reports that her depression is as a result of her drinking.  Patient quantifies her drink of Wine as a big bottle daily.  She drinks until she passes out.  She denies Alcohol withdrawal seizures.  Patient also reports that she is unable to find a job but states she does not believe Alcohol is preventing her from getting a job.   Caucasian female, Disheveled, emaciated and unkempt seen this morning for Alcohol detox treatment want to  stop using Alcohol.  She is calmer this morning than when she came in last night intoxicated.  She reports long hx of Alcohol use.  She lives alone with her cat and is unemployed.  She meets criteria for alcohol detox treatment and depression treatment.  Her plan is to try another long term alcohol rehab treatment.  She denies SI/HI/AVH and no mention of paranoia.  She is currently on CIWA Protocol with Ativan coverage.  We will fax document to facilities with available bed.  Past Psychiatric History: with  significant hx of Alcohol dependence, anxiety disorder, Nicotine use disorder and Major Depressive disorde.  No inpatient Psychiatric hospitalization except two rehab treatment.  No hx of suicide attempts.  Risk to Self or Others: Is the patient at risk to self? No Has the patient been a risk to self in the past 6 months? No Has the patient been a risk to self within the distant past? No Is the patient a risk to others? No Has the patient been a risk to others in the past 6 months? No Has the patient been a risk to others within the distant past? No  [place] Scale:  [place] ED from 09/15/2022 in [facility] Emergency Department at [facility] ED from 01/09/2022 in [facility] at [facility] ED from 10/15/2021 in [facility] Emergency Department at [place] CATEGORY High Risk No Risk No Risk       AIMS:  , , ,  ,   ASAM:  Substance Abuse:     Past Medical History:  Past Medical History:  Diagnosis Date   Alcoholic ([place])    Chronic bronchitis (Grandfather)    Still smoking   Depression    GERD (gastroesophageal reflux disease)    Hoarseness 06/2010   Oral thrush---- Dr. Melida Quitter   Hypertension    Insomnia    Malignant melanoma ([place]) 2009   Removed from left shoulder    Past Surgical History:  Procedure Laterality Date   BASAL CELL CARCINOMA EXCISION  2012   Right side nose    BREAST ENHANCEMENT SURGERY  2002    ELBOW SURGERY     Right elbow surgery---Ruptured tendon   Family History:  Family History  Problem Relation Age of Onset   Cirrhosis Father    Family Psychiatric  History: Father -Alcoholic Social History:  Social History   Substance and Sexual Activity  Alcohol Use Yes   Comment: >1 pint vodka daily and wine     Social History   Substance and Sexual Activity  Drug Use Yes   Types: Marijuana    Social History   Socioeconomic History   Marital status: Divorced    Spouse name: Not on file   Number of children: Not on file   Years of education: Not on file   Highest education level: Not on file  Occupational History   Not on file  Tobacco Use   Smoking status: Every Day    Packs/day: 1.50    Types: Cigarettes    Start date: 08/24/1972    Passive exposure: Current   Smokeless tobacco: Never  Vaping Use   Vaping Use: Never used  Substance and Sexual Activity   Alcohol use: Yes    Comment: >1 pint vodka daily and wine   Drug use: Yes    Types: Marijuana   Sexual activity: Not on file  Other Topics Concern   Not on file  Social History Narrative   Not on file   Social Determinants of Health   Financial Resource Strain: Not on file  Food Insecurity: Not on file  Transportation Needs: Not on file  Physical Activity: Not on file  Stress: Not on file  Social Connections: Not on file   Additional Social History:    Allergies:  No Known Allergies  Labs:  Results for orders placed or performed during the hospital encounter of 09/15/22 (from the past 48 hour(s))  CBC with Differential     Status: Abnormal   Collection Time: 09/15/22  3:30 AM  Result Value Ref Range   WBC 6.6 4.0 - 10.5 K/uL   RBC 5.02 3.87 - 5.11 MIL/uL   Hemoglobin 16.6 (H) 12.0 - 15.0 g/dL   HCT 48.3 (H) 36.0 - 46.0 %   MCV 96.2 80.0 - 100.0 fL   MCH 33.1 26.0 - 34.0 pg   MCHC 34.4 30.0 - 36.0 g/dL   RDW 14.8 11.5 - 15.5 %   Platelets 214 150 - 400 K/uL   nRBC 0.0 0.0 - 0.2 %    Neutrophils Relative % 56 %   Neutro Abs 3.7 1.7 - 7.7 K/uL   Lymphocytes Relative 32 %   Lymphs Abs 2.1 0.7 - 4.0 K/uL   Monocytes Relative 10 %   Monocytes Absolute 0.7 0.1 - 1.0 K/uL   Eosinophils Relative 1 %   Eosinophils Absolute 0.1 0.0 - 0.5 K/uL   Basophils Relative 1 %   Basophils Absolute 0.1 0.0 - 0.1 K/uL  Immature Granulocytes 0 %   Abs Immature Granulocytes 0.02 0.00 - 0.07 K/uL    Comment: Performed at [facility], [place] [address], [place], [place] [address]  Comprehensive metabolic panel     Status: Abnormal   Collection Time: 09/15/22  3:30 AM  Result Value Ref Range   Sodium 139 135 - 145 mmol/L   Potassium 4.0 3.5 - 5.1 mmol/L   Chloride 103 98 - 111 mmol/L   CO2 22 22 - 32 mmol/L   Glucose, Bld 91 70 - 99 mg/dL    Comment: Glucose reference range applies only to samples taken after fasting for at least 8 hours.   BUN 12 8 - 23 mg/dL   Creatinine, Ser 0.55 0.44 - 1.00 mg/dL   Calcium 8.7 (L) 8.9 - 10.3 mg/dL   Total Protein 7.4 6.5 - 8.1 g/dL   Albumin 4.2 3.5 - 5.0 g/dL   AST 44 (H) 15 - 41 U/L   ALT 28 0 - 44 U/L   Alkaline Phosphatase 78 38 - 126 U/L   Total Bilirubin 0.6 0.3 - 1.2 mg/dL   GFR, Estimated >60 >60 mL/min    Comment: (NOTE) Calculated using the CKD-EPI Creatinine Equation (2021)    Anion gap 14 5 - 15    Comment: Performed at [facility], [place] [address], [place], [place] [address]  Ethanol     Status: Abnormal   Collection Time: 09/15/22  3:30 AM  Result Value Ref Range   Alcohol, Ethyl (B) 313 (HH) <10 mg/dL    Comment: (NOTE) Lowest detectable limit for serum alcohol is 10 mg/dL.  For medical purposes only. Performed at [facility], [place] [address] [place], [place] [address]   Salicylate level     Status: Abnormal   Collection Time: 09/15/22  3:30 AM  Result Value Ref Range   Salicylate Lvl <9.3 (L) 7.0 - 30.0 mg/dL    Comment: Performed at [facility], [place] [name] [place], [place] [address]  Acetaminophen level     Status: Abnormal   Collection Time: 09/15/22  3:30 AM  Result Value Ref Range   Acetaminophen (Tylenol), Serum <10 (L) 10 - 30 ug/mL    Comment: CRITICAL RESULT CALLED TO, READ BACK BY AND VERIFIED WITH MAYHEY,A RN @ [phone] ON [id number] BY MHAMOUD,S (NOTE) Therapeutic concentrations vary significantly. A range of 10-30 ug/mL  may be an effective concentration for many patients. However, some  are best treated at concentrations outside of this range. Acetaminophen concentrations >150 ug/mL at 4 hours after ingestion  and >50 ug/mL at 12 hours after ingestion are often associated with  toxic reactions.  Performed at [facility], [place] [address], [place], [place] [address]   Resp panel by RT-PCR (RSV, Flu A&B, Covid) Anterior Nasal Swab     Status: None   Collection Time: 09/15/22  3:30 AM   Specimen: Anterior Nasal Swab  Result Value Ref Range   SARS Coronavirus 2 by RT PCR NEGATIVE NEGATIVE    Comment: (NOTE) SARS-CoV-2 target nucleic acids are NOT DETECTED.  The SARS-CoV-2 RNA is generally detectable in upper respiratory specimens during the acute phase of infection. The lowest concentration of SARS-CoV-2 viral copies this assay can detect is 138 copies/mL. A negative result does not preclude SARS-Cov-2 infection and should not be used as the sole basis for treatment or other patient management decisions. A negative result may occur with  improper specimen collection/handling,  submission of specimen other than nasopharyngeal swab, presence of viral mutation(s) within the areas targeted by this assay, and inadequate number of viral copies(<138 copies/mL). A negative result must be combined with clinical observations, patient history, and epidemiological information. The expected result is Negative.  Fact Sheet for Patients:  EntrepreneurPulse.com.au  Fact Sheet for  Healthcare Providers:  IncredibleEmployment.be  This test is no t yet approved or cleared by the [place] FDA and  has been authorized for detection and/or diagnosis of SARS-CoV-2 by FDA under an Emergency Use Authorization (EUA). This EUA will remain  in effect (meaning this test can be used) for the duration of the COVID-19 declaration under Section 564(b)(1) of the Act, 21 U.S.C.section 360bbb-3(b)(1), unless the authorization is terminated  or revoked sooner.       Influenza A by PCR NEGATIVE NEGATIVE   Influenza B by PCR NEGATIVE NEGATIVE    Comment: (NOTE) The Xpert Xpress SARS-CoV-2/FLU/RSV plus assay is intended as an aid in the diagnosis of influenza from Nasopharyngeal swab specimens and should not be used as a sole basis for treatment. Nasal washings and aspirates are unacceptable for Xpert Xpress SARS-CoV-2/FLU/RSV testing.  Fact Sheet for Patients: EntrepreneurPulse.com.au  Fact Sheet for Healthcare Providers: IncredibleEmployment.be  This test is not yet approved or cleared by the [place] FDA and has been authorized for detection and/or diagnosis of SARS-CoV-2 by FDA under an Emergency Use Authorization (EUA). This EUA will remain in effect (meaning this test can be used) for the duration of the COVID-19 declaration under Section 564(b)(1) of the Act, 21 U.S.C. section 360bbb-3(b)(1), unless the authorization is terminated or revoked.     Resp Syncytial Virus by PCR NEGATIVE NEGATIVE    Comment: (NOTE) Fact Sheet for Patients: EntrepreneurPulse.com.au  Fact Sheet for Healthcare Providers: IncredibleEmployment.be  This test is not yet approved or cleared by the [place] FDA and has been authorized for detection and/or diagnosis of SARS-CoV-2 by FDA under an Emergency Use Authorization (EUA). This EUA will remain in effect (meaning this test can be used)  for the duration of the COVID-19 declaration under Section 564(b)(1) of the Act, 21 U.S.C. section 360bbb-3(b)(1), unless the authorization is terminated or revoked.  Performed at [facility], [place] [address] [place], [place] [address]   Rapid urine drug screen (hospital performed)     Status: None   Collection Time: 09/15/22  3:31 AM  Result Value Ref Range   Opiates NONE DETECTED NONE DETECTED   Cocaine NONE DETECTED NONE DETECTED   Benzodiazepines NONE DETECTED NONE DETECTED   Amphetamines NONE DETECTED NONE DETECTED   Tetrahydrocannabinol NONE DETECTED NONE DETECTED   Barbiturates NONE DETECTED NONE DETECTED    Comment: (NOTE) DRUG SCREEN FOR MEDICAL PURPOSES ONLY.  IF CONFIRMATION IS NEEDED FOR ANY PURPOSE, NOTIFY LAB WITHIN 5 DAYS.  LOWEST DETECTABLE LIMITS FOR URINE DRUG SCREEN Drug Class                     Cutoff (ng/mL) Amphetamine and metabolites    1000 Barbiturate and metabolites    200 Benzodiazepine                 200 Opiates and metabolites        300 Cocaine and metabolites        300 THC                            50 Performed  at [facility], [place] [address], [place], [place] [address]     Current Facility-Administered Medications  Medication Dose Route Frequency Provider Last Rate Last Admin   LORazepam (ATIVAN) injection 0-4 mg  0-4 mg Intravenous Q6H Quincy Carnes M, PA-C       Or   LORazepam (ATIVAN) tablet 0-4 mg  0-4 mg Oral Q6H Larene Pickett, PA-C   1 mg at 09/15/22 1029   [START ON 09/17/2022] LORazepam (ATIVAN) injection 0-4 mg  0-4 mg Intravenous Q12H Larene Pickett, PA-C       Or   Derrill Memo ON 09/17/2022] LORazepam (ATIVAN) tablet 0-4 mg  0-4 mg Oral Q12H Quincy Carnes M, PA-C       LORazepam (ATIVAN) tablet 1 mg  1 mg Oral Once Quincy Carnes M, PA-C       mirtazapine (REMERON SOL-TAB) disintegrating tablet 15 mg  15 mg Oral QHS Drako Maese C, NP       nicotine (NICODERM CQ - dosed in mg/24 hours)  patch 21 mg  21 mg Transdermal Daily Charmaine Downs C, NP   21 mg at 09/15/22 1031   thiamine (VITAMIN B1) tablet 100 mg  100 mg Oral Daily Quincy Carnes M, PA-C   100 mg at 09/15/22 1011   Or   thiamine (VITAMIN B1) injection 100 mg  100 mg Intravenous Daily Larene Pickett, PA-C       Current Outpatient Medications  Medication Sig Dispense Refill   acetaminophen (TYLENOL) 500 MG tablet Take 500 mg by mouth every 6 (six) hours as needed for mild pain or moderate pain. (Patient not taking: Reported on 11/14/2021)     amLODipine (NORVASC) 5 MG tablet Take 1 tablet (5 mg total) by mouth daily. 30 tablet 3   aspirin EC 81 MG EC tablet Take 1 tablet (81 mg total) by mouth daily. Swallow whole. (Patient not taking: Reported on 11/14/2021) 30 tablet 11   atorvastatin (LIPITOR) 80 MG tablet Take 1 tablet (80 mg total) by mouth daily. 90 tablet 1   clopidogrel (PLAVIX) 75 MG tablet Take 75 mg by mouth daily.     FLUoxetine (PROZAC) 20 MG tablet Take 1 tablet (20 mg total) by mouth daily. 30 tablet 3   traZODone (DESYREL) 150 MG tablet Take 0.5 tablets (75 mg total) by mouth at bedtime. 30 tablet 1    Musculoskeletal: Strength & Muscle Tone: within normal limits Gait & Station: normal Patient leans: Front   Psychiatric Specialty Exam: Presentation  General Appearance:  Casual; Disheveled; Other (comment) (Emaciated, thin.)  Eye Contact: Good  Speech: Clear and Coherent; Normal Rate  Speech Volume: Normal  Handedness: Right   Mood and Affect  Mood: Depressed; Anxious; Angry; Hopeless; Worthless  Affect: Congruent; Depressed; Tearful   Thought Process  Thought Processes: Coherent; Goal Directed; Linear  Descriptions of Associations:Intact  Orientation:Full (Time, Place and Person)  Thought Content:Logical  History of Schizophrenia/Schizoaffective disorder:No data recorded Duration of Psychotic Symptoms:No data recorded Hallucinations:Hallucinations: None  Ideas of  Reference:None  Suicidal Thoughts:Suicidal Thoughts: No  Homicidal Thoughts:Homicidal Thoughts: No   Sensorium  Memory: Immediate Good; Recent Good; Remote Good  Judgment: Intact  Insight: Present   Executive Functions  Concentration: Good  Attention Span: Good  Recall: Good  Fund of Knowledge: Good  Language: Good   Psychomotor Activity  Psychomotor Activity: Psychomotor Activity: Normal   Assets  Assets: Communication Skills; Desire for Improvement; Physical Health    Sleep  Sleep: Sleep: Good  Physical Exam: Physical Exam HENT:     Head: Normocephalic and atraumatic.     Nose: Nose normal.  Cardiovascular:     Rate and Rhythm: Normal rate and regular rhythm.  Pulmonary:     Effort: Pulmonary effort is normal.  Musculoskeletal:        General: Normal range of motion.     Cervical back: Normal range of motion.  Skin:    General: Skin is warm and dry.  Neurological:     Mental Status: She is alert.    Review of Systems  Constitutional: Negative.   HENT: Negative.    Eyes: Negative.   Respiratory: Negative.    Cardiovascular: Negative.   Gastrointestinal: Negative.   Genitourinary: Negative.   Musculoskeletal: Negative.   Skin: Negative.   Neurological: Negative.   Endo/Heme/Allergies: Negative.   Psychiatric/Behavioral:  Positive for depression. The patient is nervous/anxious.        Alcoholism   Blood pressure (!) 168/96, pulse 96, temperature 98.2 F (36.8 C), temperature source Oral, resp. rate 16, SpO2 97 %. There is no height or weight on file to calculate BMI.  Medical Decision Making: [place] female, Disheveled, emaciated and unkempt seen this morning for Alcohol detox treatment want to stop using Alcohol.  She is calmer this morning than when she came in last night intoxicated.  She reports long hx of Alcohol use.  She lives alone with her cat and is unemployed.  She meets criteria for alcohol detox treatment and  depression treatment.  Her plan is to try another long term alcohol rehab treatment.  She denies SI/HI/AVH and no mention of paranoia.  She is currently on CIWA Protocol with Ativan coverage.  We will fax document to facilities with available bed. Problem 1: Alcohol use disorder, severe dependence Problem 2: Recurrent Major Depressive disorder, without Psychotic features. Disposition:  Admit, seek bed placement.  Delfin Gant, NP-PMHNP-BC 09/15/2022 11:14 AM

## 2022-09-16 ENCOUNTER — Encounter: Payer: Self-pay | Admitting: Psychiatry

## 2022-09-16 DIAGNOSIS — F332 Major depressive disorder, recurrent severe without psychotic features: Secondary | ICD-10-CM | POA: Diagnosis not present

## 2022-09-16 MED ORDER — FOLIC ACID 1 MG PO TABS
1.0000 mg | ORAL_TABLET | Freq: Every day | ORAL | Status: DC
  Administered 2022-09-16 – 2022-09-19 (×4): 1 mg via ORAL
  Filled 2022-09-16 (×4): qty 1

## 2022-09-16 MED ORDER — AMLODIPINE BESYLATE 5 MG PO TABS
10.0000 mg | ORAL_TABLET | Freq: Every day | ORAL | Status: DC
  Administered 2022-09-17 – 2022-09-22 (×6): 10 mg via ORAL
  Filled 2022-09-16 (×6): qty 2

## 2022-09-16 MED ORDER — RISPERIDONE 1 MG PO TABS
0.5000 mg | ORAL_TABLET | ORAL | Status: DC
  Administered 2022-09-16 – 2022-09-18 (×4): 0.5 mg via ORAL
  Filled 2022-09-16 (×4): qty 1

## 2022-09-16 MED ORDER — TRAZODONE HCL 50 MG PO TABS
150.0000 mg | ORAL_TABLET | Freq: Every day | ORAL | Status: DC
  Administered 2022-09-16 – 2022-09-21 (×6): 150 mg via ORAL
  Filled 2022-09-16 (×6): qty 1

## 2022-09-16 NOTE — Progress Notes (Signed)
   09/16/22 0600  15 Minute Checks  Location Bedroom  Visual Appearance Calm  Behavior Sleeping  Sleep (Behavioral Health Patients Only)  Calculate sleep? (Click Yes once per 24 hr at 0600 safety check) Yes  Documented sleep last 24 hours 11

## 2022-09-16 NOTE — BH IP Treatment Plan (Signed)
Interdisciplinary Treatment and Diagnostic Plan Update  09/16/2022 Time of Session: 9:30AM Dorothy [name] MRN: [id number]  Principal Diagnosis: MDD (major depressive disorder), recurrent severe, without psychosis ([place])  Secondary Diagnoses: Principal Problem:   MDD (major depressive disorder), recurrent severe, without psychosis ([name])   Current Medications:  Current Facility-Administered Medications  Medication Dose Route Frequency Provider Last Rate Last Admin   acetaminophen (TYLENOL) tablet 650 mg  650 mg Oral Q6H PRN Parks Ranger, DO       alum & mag hydroxide-simeth (MAALOX/MYLANTA) 200-200-20 MG/5ML suspension 30 mL  30 mL Oral Q4H PRN Parks Ranger, DO       [START ON 09/17/2022] amLODipine (NORVASC) tablet 10 mg  10 mg Oral Daily Parks Ranger, DO       aspirin EC tablet 81 mg  81 mg Oral Daily Parks Ranger, DO   81 mg at 09/16/22 3875   atorvastatin (LIPITOR) tablet 80 mg  80 mg Oral QHS Parks Ranger, DO   80 mg at 09/15/22 2102   clopidogrel (PLAVIX) tablet 75 mg  75 mg Oral Daily Parks Ranger, DO   75 mg at 09/16/22 0940   diazepam (VALIUM) tablet 5 mg  5 mg Oral BID Parks Ranger, DO   5 mg at 09/16/22 6433   FLUoxetine (PROZAC) capsule 20 mg  20 mg Oral Daily Parks Ranger, DO   20 mg at 29/51/88 4166   folic acid (FOLVITE) tablet 1 mg  1 mg Oral Daily Parks Ranger, DO   1 mg at 09/16/22 1124   LORazepam (ATIVAN) injection 0-4 mg  0-4 mg Intravenous Q6H Parks Ranger, DO       Or   LORazepam (ATIVAN) tablet 0-4 mg  0-4 mg Oral Q6H Parks Ranger, DO   1 mg at 09/16/22 0941   [START ON 09/17/2022] LORazepam (ATIVAN) injection 0-4 mg  0-4 mg Intravenous Q12H Parks Ranger, DO       Or   Derrill Memo ON 09/17/2022] LORazepam (ATIVAN) tablet 0-4 mg  0-4 mg Oral Q12H Herrick, Richard Percell Miller, DO       magnesium hydroxide (MILK OF MAGNESIA) suspension 30 mL  30 mL  Oral Daily PRN Parks Ranger, DO       nicotine (NICODERM CQ - dosed in mg/24 hours) patch 21 mg  21 mg Transdermal Daily Parks Ranger, DO   21 mg at 09/16/22 0939   OLANZapine (ZYPREXA) tablet 10 mg  10 mg Oral Q6H PRN Parks Ranger, DO       risperiDONE (RISPERDAL) tablet 0.5 mg  0.5 mg Oral [id number] Parks Ranger, DO       thiamine (VITAMIN B1) tablet 100 mg  100 mg Oral Daily Parks Ranger, DO   100 mg at 09/16/22 0940   Or   thiamine (VITAMIN B1) injection 100 mg  100 mg Intravenous Daily Parks Ranger, DO       traZODone (DESYREL) tablet 150 mg  150 mg Oral QHS Parks Ranger, DO       PTA Medications: Medications Prior to Admission  Medication Sig Dispense Refill Last Dose   acetaminophen (TYLENOL) 500 MG tablet Take 500 mg by mouth every 6 (six) hours as needed for mild pain or moderate pain. (Patient not taking: Reported on 11/14/2021)      amLODipine (NORVASC) 5 MG tablet Take 1 tablet (5 mg total) by mouth daily. 30 tablet 3  aspirin EC 81 MG EC tablet Take 1 tablet (81 mg total) by mouth daily. Swallow whole. (Patient not taking: Reported on 11/14/2021) 30 tablet 11    atorvastatin (LIPITOR) 80 MG tablet Take 1 tablet (80 mg total) by mouth daily. 90 tablet 1    clopidogrel (PLAVIX) 75 MG tablet Take 75 mg by mouth daily.      FLUoxetine (PROZAC) 20 MG tablet Take 1 tablet (20 mg total) by mouth daily. 30 tablet 3    traZODone (DESYREL) 150 MG tablet Take 0.5 tablets (75 mg total) by mouth at bedtime. 30 tablet 1     Patient Stressors:    Patient Strengths:    Treatment Modalities: Medication Management, Group therapy, Case management,  1 to 1 session with clinician, Psychoeducation, Recreational therapy.   Physician Treatment Plan for Primary Diagnosis: MDD (major depressive disorder), recurrent severe, without psychosis ([name]) Long Term Goal(s): Improvement in symptoms so as ready for discharge   Short  Term Goals: Ability to identify changes in lifestyle to reduce recurrence of condition will improve Ability to verbalize feelings will improve Ability to disclose and discuss suicidal ideas Ability to demonstrate self-control will improve Ability to identify and develop effective coping behaviors will improve Ability to maintain clinical measurements within normal limits will improve Compliance with prescribed medications will improve Ability to identify triggers associated with substance abuse/mental health issues will improve  Medication Management: Evaluate patient's response, side effects, and tolerance of medication regimen.  Therapeutic Interventions: 1 to 1 sessions, Unit Group sessions and Medication administration.  Evaluation of Outcomes: Not Met  Physician Treatment Plan for Secondary Diagnosis: Principal Problem:   MDD (major depressive disorder), recurrent severe, without psychosis ([place])  Long Term Goal(s): Improvement in symptoms so as ready for discharge   Short Term Goals: Ability to identify changes in lifestyle to reduce recurrence of condition will improve Ability to verbalize feelings will improve Ability to disclose and discuss suicidal ideas Ability to demonstrate self-control will improve Ability to identify and develop effective coping behaviors will improve Ability to maintain clinical measurements within normal limits will improve Compliance with prescribed medications will improve Ability to identify triggers associated with substance abuse/mental health issues will improve     Medication Management: Evaluate patient's response, side effects, and tolerance of medication regimen.  Therapeutic Interventions: 1 to 1 sessions, Unit Group sessions and Medication administration.  Evaluation of Outcomes: Not Met   RN Treatment Plan for Primary Diagnosis: MDD (major depressive disorder), recurrent severe, without psychosis ([name]) Long Term Goal(s): Knowledge of  disease and therapeutic regimen to maintain health will improve  Short Term Goals: Ability to remain free from injury will improve, Ability to verbalize frustration and anger appropriately will improve, Ability to demonstrate self-control, Ability to participate in decision making will improve, Ability to verbalize feelings will improve, Ability to identify and develop effective coping behaviors will improve, and Compliance with prescribed medications will improve  Medication Management: RN will administer medications as ordered by provider, will assess and evaluate patient's response and provide education to patient for prescribed medication. RN will report any adverse and/or side effects to prescribing provider.  Therapeutic Interventions: 1 on 1 counseling sessions, Psychoeducation, Medication administration, Evaluate responses to treatment, Monitor vital signs and CBGs as ordered, Perform/monitor CIWA, COWS, AIMS and Fall Risk screenings as ordered, Perform wound care treatments as ordered.  Evaluation of Outcomes: Not Met   LCSW Treatment Plan for Primary Diagnosis: MDD (major depressive disorder), recurrent severe, without psychosis ([place]) Long Term  Goal(s): Safe transition to appropriate next level of care at discharge, Engage patient in therapeutic group addressing interpersonal concerns.  Short Term Goals: Engage patient in aftercare planning with referrals and resources, Increase social support, Increase ability to appropriately verbalize feelings, Increase emotional regulation, Facilitate acceptance of mental health diagnosis and concerns, Facilitate patient progression through stages of change regarding substance use diagnoses and concerns, Identify triggers associated with mental health/substance abuse issues, and Increase skills for wellness and recovery  Therapeutic Interventions: Assess for all discharge needs, 1 to 1 time with Social worker, Explore available resources and support  systems, Assess for adequacy in community support network, Educate family and significant other(s) on suicide prevention, Complete Psychosocial Assessment, Interpersonal group therapy.  Evaluation of Outcomes: Not Met   Progress in Treatment: Attending groups: Yes. Participating in groups: Yes. Taking medication as prescribed: Yes. Toleration medication: Yes. Family/Significant other contact made: No, will contact:  pt declined permission for collateral contact. SPE completed with pt Patient understands diagnosis: Yes. Discussing patient identified problems/goals with staff: Yes. Medical problems stabilized or resolved: Yes. Denies suicidal/homicidal ideation: Yes. Issues/concerns per patient self-inventory: No. Other: None  New problem(s) identified: No, Describe:  None  New Short Term/Long Term Goal(s): Patient to work towards detox, medication management for mood stabilization; elimination of SI thoughts; development of comprehensive mental wellness/sobriety plan.   Patient Goals:  "detox"  Discharge Plan or Barriers: CSW will assist pt with development of appropriate discharge/aftercare plan.   Reason for Continuation of Hospitalization: Anxiety Depression Medication stabilization Withdrawal symptoms  Estimated Length of Stay: 1-7 days  Last 3 [place] Suicide Severity Risk Score: Flowsheet Row Admission (Current) from 09/15/2022 in [facility] Most recent reading at 09/16/2022  9:40 AM ED from 09/15/2022 in [facility] Emergency Department at [facility] Most recent reading at 09/15/2022  3:24 AM ED from 01/09/2022 in [facility] Most recent reading at 01/09/2022  1:24 PM  C-SSRS RISK CATEGORY No Risk High Risk No Risk       Last PHQ 2/9 Scores:    12/15/2021    1:40 PM 11/14/2021   10:52 AM  Depression screen PHQ 2/9  Decreased Interest 0 0  Down, Depressed, Hopeless 2 1  PHQ - 2 Score 2 1   Altered sleeping 1 1  Tired, decreased energy 1 1  Change in appetite 0 0  Feeling bad or failure about yourself  1 1  Trouble concentrating 0 0  Moving slowly or fidgety/restless 0 0  Suicidal thoughts 0 0  PHQ-9 Score 5 4  Difficult doing work/chores Somewhat difficult     Scribe for Treatment Team: Kelan Pritt A [place], [place] 09/16/2022 2:12 PM

## 2022-09-16 NOTE — Progress Notes (Signed)
   09/15/22 2200  Psych Admission Type (Psych Patients Only)  Admission Status Voluntary  Psychosocial Assessment  Patient Complaints  (alcohol abuse)  Eye Contact Brief  Facial Expression Flat  Affect Sad  Speech Slow  Interaction Minimal  Motor Activity Other (Comment) (lying in the bed)  Appearance/Hygiene Disheveled  Behavior Characteristics Appropriate to situation  Mood Sad;Depressed  Aggressive Behavior  Effect No apparent injury  Thought Process  Coherency WDL  Content WDL  Delusions None reported or observed  Perception WDL  Hallucination None reported or observed  Judgment Impaired  Confusion None  Danger to Self  Current suicidal ideation? Passive  Self-Injurious Behavior Some self-injurious ideation observed or expressed.  No lethal plan expressed   Agreement Not to Harm Self Yes  Description of Agreement verbal  Danger to Others  Danger to Others None reported or observed

## 2022-09-16 NOTE — BHH Group Notes (Signed)
[facility] Group Notes:  (Nursing/MHT/Case Management/Adjunct)  Date:  09/16/2022  Time:  12:22 PM  Type of Therapy:  Movement Therapy  Participation Level:  Active  Participation Quality:  Appropriate  Affect:  Appropriate  Cognitive:  Alert  Insight:  Appropriate  Engagement in Group:  Engaged  Modes of Intervention:  Activity  Summary of Progress/Problems:  Dorothy [name] l [name] 09/16/2022, 12:22 PM

## 2022-09-16 NOTE — Group Note (Signed)
[facility] LCSW Group Therapy Note   Group Date: 09/16/2022 Start Time: 3557 End Time: 1245  Type of Therapy/Topic:  Group Therapy:  Feelings about Diagnosis  Participation Level:  Did Not Attend   Mood:    Description of Group:    This group will allow patients to explore their thoughts and feelings about diagnoses they have received. Patients will be guided to explore their level of understanding and acceptance of these diagnoses. Facilitator will encourage patients to process their thoughts and feelings about the reactions of others to their diagnosis, and will guide patients in identifying ways to discuss their diagnosis with significant others in their lives. This group will be process-oriented, with patients participating in exploration of their own experiences as well as giving and receiving support and challenge from other group members.   Therapeutic Goals: 1. Patient will demonstrate understanding of diagnosis as evidence by identifying two or more symptoms of the disorder:  2. Patient will be able to express two feelings regarding the diagnosis 3. Patient will demonstrate ability to communicate their needs through discussion and/or role plays  Summary of Patient Progress:   X    Therapeutic Modalities:   Cognitive Behavioral Therapy Brief Therapy Feelings Identification    Dorothy [name], LCSWA

## 2022-09-16 NOTE — BHH Suicide Risk Assessment (Signed)
[facility] Admission Suicide Risk Assessment   Nursing information obtained from:    Demographic factors:  Divorced or widowed, Caucasian, Living alone, Access to firearms Current Mental Status:  Suicidal ideation indicated by patient, Belief that plan would result in death, Intention to act on suicide plan, Self-harm thoughts, Suicide plan Loss Factors:  Loss of significant relationship Historical Factors:  Impulsivity Risk Reduction Factors:  NA  Total Time spent with patient: 1 hour Principal Problem: MDD (major depressive disorder), recurrent severe, without psychosis ([place]) Diagnosis:  Principal Problem:   MDD (major depressive disorder), recurrent severe, without psychosis ([place])  Subjective Data: Pt arrives with GCSO with reports of suicidal ideation after she called for herself. Pt arrives and reports she has been feeling suicidal and does not want to live any more. Pt reports she has been unable to get a job and does not have anything to look forward to. Pt repetitively talking about not having anything to do. Pt reports daily ETOH.   Continued Clinical Symptoms:  Alcohol Use Disorder Identification Test Final Score (AUDIT): 36 The "Alcohol Use Disorders Identification Test", Guidelines for Use in Primary Care, Second Edition.  World Pharmacologist [facility]). Score between 0-7:  no or low risk or alcohol related problems. Score between 8-15:  moderate risk of alcohol related problems. Score between 16-19:  high risk of alcohol related problems. Score 20 or above:  warrants further diagnostic evaluation for alcohol dependence and treatment.   CLINICAL FACTORS:   Severe Anxiety and/or Agitation Alcohol/Substance Abuse/Dependencies   Musculoskeletal: Strength & Muscle Tone: within normal limits Gait & Station: normal Patient leans: N/A  Psychiatric Specialty Exam:  Presentation  General Appearance:  Casual; Disheveled; Other (comment) (Emaciated, thin.)  Eye  Contact: Good  Speech: Clear and Coherent; Normal Rate  Speech Volume: Normal  Handedness: Right   Mood and Affect  Mood: Depressed; Anxious; Angry; Hopeless; Worthless  Affect: Congruent; Depressed; Tearful   Thought Process  Thought Processes: Coherent; Goal Directed; Linear  Descriptions of Associations:Intact  Orientation:Full (Time, Place and Person)  Thought Content:Logical  History of Schizophrenia/Schizoaffective disorder:No data recorded Duration of Psychotic Symptoms:No data recorded Hallucinations:Hallucinations: None  Ideas of Reference:None  Suicidal Thoughts:Suicidal Thoughts: No  Homicidal Thoughts:Homicidal Thoughts: No   Sensorium  Memory: Immediate Good; Recent Good; Remote Good  Judgment: Intact  Insight: Present   Executive Functions  Concentration: Good  Attention Span: Good  Recall: Good  Fund of Knowledge: Good  Language: Good   Psychomotor Activity  Psychomotor Activity: Psychomotor Activity: Normal   Assets  Assets: Communication Skills; Desire for Improvement; Physical Health   Sleep  Sleep: Sleep: Good     Blood pressure 136/83, pulse 93, temperature 97.9 F (36.6 C), temperature source Oral, resp. rate 16, height '5\' 6"'$  (1.676 m), weight 59.4 kg, SpO2 94 %. Body mass index is 21.14 kg/m.   COGNITIVE FEATURES THAT CONTRIBUTE TO RISK:  Thought constriction (tunnel vision)    SUICIDE RISK:   Mild:  Suicidal ideation of limited frequency, intensity, duration, and specificity.  There are no identifiable plans, no associated intent, mild dysphoria and related symptoms, good self-control (both objective and subjective assessment), few other risk factors, and identifiable protective factors, including available and accessible social support.  PLAN OF CARE: See orders  I certify that inpatient services furnished can reasonably be expected to improve the patient's condition.   Parks Ranger, DO 09/16/2022, 9:40 AM

## 2022-09-16 NOTE — Plan of Care (Signed)
D- Patient alert and oriented x 4. Patient presents with a flat mood and affect but the patient is pleasant. Patient denies SI, HI, AVH, and pain. CIWA scale continued and patient closely monitored by this Probation officer. Patient denies nausea and states that she is just tired.   A- Scheduled medications administered to patient, per MD orders. Support and encouragement provided.  Routine safety checks conducted every 15 minutes.  Patient informed to notify staff with problems or concerns.  R- No adverse drug reactions or CIWA symptoms noted. Patient contracts for safety at this time. Patient compliant with medications and treatment plan. Patient receptive, calm, and cooperative. Patient interacts well with others on the unit. Patient participated in group. Patient remains safe at this time.   Problem: Nutrition: Goal: Adequate nutrition will be maintained Outcome: Progressing   Problem: Coping: Goal: Level of anxiety will decrease Outcome: Progressing   Problem: Skin Integrity: Goal: Risk for impaired skin integrity will decrease Outcome: Progressing   Problem: Education: Goal: Ability to make informed decisions regarding treatment will improve Outcome: Progressing   Problem: Medication: Goal: Compliance with prescribed medication regimen will improve Outcome: Progressing   Problem: Self-Concept: Goal: Ability to disclose and discuss suicidal ideas will improve Outcome: Progressing   Problem: Education: Goal: Knowledge of disease or condition will improve Outcome: Progressing

## 2022-09-16 NOTE — BHH Counselor (Signed)
CSW made 1st attempt with pt to complete PSA. Pt was feeling unwell, CSW stated they would follow up at another more opportune time to complete PSA.    Savhanna Sliva [place], MSW, LCSW-A 1/24/20243:24 PM

## 2022-09-16 NOTE — H&P (Signed)
Psychiatric Admission Assessment Adult  Patient Identification: Dorothy [name] MRN:  [id number] Date of Evaluation:  09/16/2022 Chief Complaint:  MDD (major depressive disorder), recurrent severe, without psychosis ([place]) [F33.2] Principal Diagnosis: MDD (major depressive disorder), recurrent severe, without psychosis ([place]) Diagnosis:  Principal Problem:   MDD (major depressive disorder), recurrent severe, without psychosis ([place])  History of Present Illness: Dorothy [name] is a 68 y.o. female patient admitted with  significant hx of Alcohol dependence, anxiety disorder, Nicotine use disorder and Major Depressive disorder brought in by [facility]  with report of feeling suicidal in the context of her inability to stop drinking alcohol.  On arrival to the ER alcohol level was 313.  Pt arrives with [employer] with reports of suicidal ideation after she called for herself. Pt arrives and reports she has been feeling suicidal and does not want to live any more. Pt reports she has been unable to get a job and does not have anything to look forward to. Pt repetitively talking about not having anything to do. Pt reports daily ETOH.   She reports and rated Depression 10/10 with 10 being severe depression.  She reports feeling worthless, helpless and guilty of falling back on Alcohol after two long term Alcohol rehab treatment.  She also cannot drive due to two DUI that led to her  losing her License. Patient reports started drinking at age 44 and smokes two packs of Cigarettes a day.She is estranged from her children due to alcohol use.  She reports that her depression is as a result of her drinking. She lives alone with her cat and is unemployed. Her plan is to try another long term alcohol rehab treatment.   Associated Signs/Symptoms: Depression Symptoms:  depressed mood, anhedonia, feelings of worthlessness/guilt, hopelessness, suicidal thoughts without plan, anxiety, (Hypo) Manic Symptoms:  Irritable  Mood, Anxiety Symptoms:  Excessive Worry, Psychotic Symptoms:   Denied PTSD Symptoms: NA Total Time spent with patient: 1 hour  Past Psychiatric History:Hx of Alcohol dependence, anxiety disorder, Nicotine use disorder and Major Depressive disorde. No inpatient Psychiatric hospitalization except two rehab treatment. No hx of suicide attempts.   Is the patient at risk to self? Yes.    Has the patient been a risk to self in the past 6 months? Yes.    Has the patient been a risk to self within the distant past? Yes.    Is the patient a risk to others? No.  Has the patient been a risk to others in the past 6 months? No.  Has the patient been a risk to others within the distant past? No.   [place] Scale:  [employer] Admission (Current) from 09/15/2022 in [place] Most recent reading at 09/15/2022 10:00 PM ED from 09/15/2022 in [facility] Emergency Department at [facility] Most recent reading at 09/15/2022  3:24 AM ED from 01/09/2022 in [facility] Urgent Care at [facility] Most recent reading at 01/09/2022  1:24 PM  C-SSRS RISK CATEGORY High Risk High Risk No Risk        Prior Inpatient Therapy: No. If yes, describe as above Prior Outpatient Therapy: No. If yes, describe as above  Alcohol Screening: 1. How often do you have a drink containing alcohol?: 4 or more times a week 2. How many drinks containing alcohol do you have on a typical day when you are drinking?: 10 or more 3. How often do you have six or more drinks on one occasion?: Daily or almost  daily AUDIT-C Score: 12 4. How often during the last year have you found that you were not able to stop drinking once you had started?: Daily or almost daily 5. How often during the last year have you failed to do what was normally expected from you because of drinking?: Daily or almost daily 6. How often during the last year have you needed a first drink in the morning to get yourself  going after a heavy drinking session?: Daily or almost daily 7. How often during the last year have you had a feeling of guilt of remorse after drinking?: Daily or almost daily 8. How often during the last year have you been unable to remember what happened the night before because you had been drinking?: Daily or almost daily 9. Have you or someone else been injured as a result of your drinking?: No 10. Has a relative or friend or a doctor or another health worker been concerned about your drinking or suggested you cut down?: Yes, during the last year Alcohol Use Disorder Identification Test Final Score (AUDIT): 36 Alcohol Brief Interventions/Follow-up: Alcohol education/Brief advice Substance Abuse History in the last 12 months:  Yes.   Consequences of Substance Abuse: Legal Consequences:  As above Family Consequences:  As above Withdrawal Symptoms:   Headaches Tremors Previous Psychotropic Medications: Yes  Psychological Evaluations: Yes  Past Medical History:  Past Medical History:  Diagnosis Date   Alcoholic ([place])    Chronic bronchitis ([place])    Still smoking   Depression    GERD (gastroesophageal reflux disease)    Hoarseness 06/2010   Oral thrush---- Dr. Melida Quitter   Hypertension    Insomnia    Malignant melanoma ([place]) 2009   Removed from left shoulder    Past Surgical History:  Procedure Laterality Date   BASAL CELL CARCINOMA EXCISION  2012   Right side nose    BREAST ENHANCEMENT SURGERY  2002   ELBOW SURGERY     Right elbow surgery---Ruptured tendon   Family History:  Family History  Problem Relation Age of Onset   Cirrhosis Father    Family Psychiatric  History: Unknown Tobacco Screening:  Social History   Tobacco Use  Smoking Status Every Day   Packs/day: 1.50   Types: Cigarettes   Start date: 08/24/1972   Passive exposure: Current  Smokeless Tobacco Never    BH Tobacco Counseling     Are you interested in Tobacco Cessation Medications?  No value  filed. Counseled patient on smoking cessation:  No value filed. Reason Tobacco Screening Not Completed: No value filed.       Social History:  Social History   Substance and Sexual Activity  Alcohol Use Yes   Comment: >1 pint vodka daily and wine     Social History   Substance and Sexual Activity  Drug Use Yes   Types: Marijuana    Additional Social History:                           Allergies:  No Known Allergies Lab Results:  Results for orders placed or performed during the hospital encounter of 09/15/22 (from the past 48 hour(s))  CBC with Differential     Status: Abnormal   Collection Time: 09/15/22  3:30 AM  Result Value Ref Range   WBC 6.6 4.0 - 10.5 K/uL   RBC 5.02 3.87 - 5.11 MIL/uL   Hemoglobin 16.6 (H) 12.0 - 15.0 g/dL  HCT 48.3 (H) 36.0 - 46.0 %   MCV 96.2 80.0 - 100.0 fL   MCH 33.1 26.0 - 34.0 pg   MCHC 34.4 30.0 - 36.0 g/dL   RDW 14.8 11.5 - 15.5 %   Platelets 214 150 - 400 K/uL   nRBC 0.0 0.0 - 0.2 %   Neutrophils Relative % 56 %   Neutro Abs 3.7 1.7 - 7.7 K/uL   Lymphocytes Relative 32 %   Lymphs Abs 2.1 0.7 - 4.0 K/uL   Monocytes Relative 10 %   Monocytes Absolute 0.7 0.1 - 1.0 K/uL   Eosinophils Relative 1 %   Eosinophils Absolute 0.1 0.0 - 0.5 K/uL   Basophils Relative 1 %   Basophils Absolute 0.1 0.0 - 0.1 K/uL   Immature Granulocytes 0 %   Abs Immature Granulocytes 0.02 0.00 - 0.07 K/uL    Comment: Performed at [name], [place] [address], [place], [place] [address]  Comprehensive metabolic panel     Status: Abnormal   Collection Time: 09/15/22  3:30 AM  Result Value Ref Range   Sodium 139 135 - 145 mmol/L   Potassium 4.0 3.5 - 5.1 mmol/L   Chloride 103 98 - 111 mmol/L   CO2 22 22 - 32 mmol/L   Glucose, Bld 91 70 - 99 mg/dL    Comment: Glucose reference range applies only to samples taken after fasting for at least 8 hours.   BUN 12 8 - 23 mg/dL   Creatinine, Ser 0.55 0.44 - 1.00 mg/dL   Calcium 8.7  (L) 8.9 - 10.3 mg/dL   Total Protein 7.4 6.5 - 8.1 g/dL   Albumin 4.2 3.5 - 5.0 g/dL   AST 44 (H) 15 - 41 U/L   ALT 28 0 - 44 U/L   Alkaline Phosphatase 78 38 - 126 U/L   Total Bilirubin 0.6 0.3 - 1.2 mg/dL   GFR, Estimated >60 >60 mL/min    Comment: (NOTE) Calculated using the CKD-EPI Creatinine Equation (2021)    Anion gap 14 5 - 15    Comment: Performed at [facility], [place] [address] [place], [place] [address]  Ethanol     Status: Abnormal   Collection Time: 09/15/22  3:30 AM  Result Value Ref Range   Alcohol, Ethyl (B) 313 (HH) <10 mg/dL    Comment: (NOTE) Lowest detectable limit for serum alcohol is 10 mg/dL.  For medical purposes only. Performed at [facility], [place] [address] [place], [place] [address]   Salicylate level     Status: Abnormal   Collection Time: 09/15/22  3:30 AM  Result Value Ref Range   Salicylate Lvl <2.3 (L) 7.0 - 30.0 mg/dL    Comment: Performed at [facility], [place] [address] [place],  [address]  Acetaminophen level     Status: Abnormal   Collection Time: 09/15/22  3:30 AM  Result Value Ref Range   Acetaminophen (Tylenol), Serum <10 (L) 10 - 30 ug/mL    Comment: CRITICAL RESULT CALLED TO, READ BACK BY AND VERIFIED WITH MAYHEY,A RN @ [id number] ON [id number] BY MHAMOUD,S (NOTE) Therapeutic concentrations vary significantly. A range of 10-30 ug/mL  may be an effective concentration for many patients. However, some  are best treated at concentrations outside of this range. Acetaminophen concentrations >150 ug/mL at 4 hours after ingestion  and >50 ug/mL at 12 hours after ingestion are often associated with  toxic reactions.  Performed at [facility], [place] [address], [place], [place] [address]   Resp panel by RT-PCR (RSV, Flu A&B, Covid) Anterior Nasal Swab     Status: None   Collection Time: 09/15/22  3:30 AM   Specimen: Anterior Nasal Swab  Result Value Ref Range    SARS Coronavirus 2 by RT PCR NEGATIVE NEGATIVE    Comment: (NOTE) SARS-CoV-2 target nucleic acids are NOT DETECTED.  The SARS-CoV-2 RNA is generally detectable in upper respiratory specimens during the acute phase of infection. The lowest concentration of SARS-CoV-2 viral copies this assay can detect is 138 copies/mL. A negative result does not preclude SARS-Cov-2 infection and should not be used as the sole basis for treatment or other patient management decisions. A negative result may occur with  improper specimen collection/handling, submission of specimen other than nasopharyngeal swab, presence of viral mutation(s) within the areas targeted by this assay, and inadequate number of viral copies(<138 copies/mL). A negative result must be combined with clinical observations, patient history, and epidemiological information. The expected result is Negative.  Fact Sheet for Patients:  EntrepreneurPulse.com.au  Fact Sheet for Healthcare Providers:  IncredibleEmployment.be  This test is no t yet approved or cleared by the [place] FDA and  has been authorized for detection and/or diagnosis of SARS-CoV-2 by FDA under an Emergency Use Authorization (EUA). This EUA will remain  in effect (meaning this test can be used) for the duration of the COVID-19 declaration under Section 564(b)(1) of the Act, 21 U.S.C.section 360bbb-3(b)(1), unless the authorization is terminated  or revoked sooner.       Influenza A by PCR NEGATIVE NEGATIVE   Influenza B by PCR NEGATIVE NEGATIVE    Comment: (NOTE) The Xpert Xpress SARS-CoV-2/FLU/RSV plus assay is intended as an aid in the diagnosis of influenza from Nasopharyngeal swab specimens and should not be used as a sole basis for treatment. Nasal washings and aspirates are unacceptable for Xpert Xpress SARS-CoV-2/FLU/RSV testing.  Fact Sheet for Patients: EntrepreneurPulse.com.au  Fact  Sheet for Healthcare Providers: IncredibleEmployment.be  This test is not yet approved or cleared by the [place] FDA and has been authorized for detection and/or diagnosis of SARS-CoV-2 by FDA under an Emergency Use Authorization (EUA). This EUA will remain in effect (meaning this test can be used) for the duration of the COVID-19 declaration under Section 564(b)(1) of the Act, 21 U.S.C. section 360bbb-3(b)(1), unless the authorization is terminated or revoked.     Resp Syncytial Virus by PCR NEGATIVE NEGATIVE    Comment: (NOTE) Fact Sheet for Patients: EntrepreneurPulse.com.au  Fact Sheet for Healthcare Providers: IncredibleEmployment.be  This test is not yet approved or cleared by the [place] FDA and has been authorized for detection and/or diagnosis of SARS-CoV-2 by FDA under an Emergency Use Authorization (EUA). This EUA will remain in effect (meaning this test can be used) for the duration of the COVID-19 declaration under Section 564(b)(1) of the Act, 21 U.S.C. section 360bbb-3(b)(1), unless the authorization is terminated or revoked.  Performed at [facility], [place] [address] [place], [place] [address]   Rapid urine drug screen (hospital performed)     Status: None   Collection Time: 09/15/22  3:31 AM  Result Value Ref Range   Opiates NONE DETECTED NONE DETECTED   Cocaine NONE DETECTED NONE DETECTED   Benzodiazepines NONE DETECTED NONE DETECTED   Amphetamines NONE DETECTED NONE DETECTED   Tetrahydrocannabinol NONE DETECTED NONE DETECTED   Barbiturates NONE DETECTED NONE DETECTED    Comment: (NOTE) DRUG SCREEN  FOR MEDICAL PURPOSES ONLY.  IF CONFIRMATION IS NEEDED FOR ANY PURPOSE, NOTIFY LAB WITHIN 5 DAYS.  LOWEST DETECTABLE LIMITS FOR URINE DRUG SCREEN Drug Class                     Cutoff (ng/mL) Amphetamine and metabolites    1000 Barbiturate and metabolites     200 Benzodiazepine                 200 Opiates and metabolites        300 Cocaine and metabolites        300 THC                            50 Performed at [facility], [place] [address] [place], [place] [address]     Blood Alcohol level:  Lab Results  Component Value Date   ETH 313 [facility]) 09/15/2022   ETH <10 18/84/1660    Metabolic Disorder Labs:  Lab Results  Component Value Date   HGBA1C 5.5 10/16/2021   MPG 111 10/16/2021   No results found for: "PROLACTIN" Lab Results  Component Value Date   CHOL 217 (H) 10/16/2021   TRIG 96 10/16/2021   HDL 43 10/16/2021   CHOLHDL 5.0 10/16/2021   VLDL 19 10/16/2021   LDLCALC 155 (H) 10/16/2021    Current Medications: Current Facility-Administered Medications  Medication Dose Route Frequency Provider Last Rate Last Admin   acetaminophen (TYLENOL) tablet 650 mg  650 mg Oral Q6H PRN Parks Ranger, DO       alum & mag hydroxide-simeth (MAALOX/MYLANTA) 200-200-20 MG/5ML suspension 30 mL  30 mL Oral Q4H PRN Parks Ranger, DO       amLODipine (NORVASC) tablet 5 mg  5 mg Oral Daily Parks Ranger, DO   5 mg at 09/16/22 0940   aspirin EC tablet 81 mg  81 mg Oral Daily Parks Ranger, DO   81 mg at 09/16/22 0939   atorvastatin (LIPITOR) tablet 80 mg  80 mg Oral QHS Parks Ranger, DO   80 mg at 09/15/22 2102   clopidogrel (PLAVIX) tablet 75 mg  75 mg Oral Daily Parks Ranger, DO   75 mg at 09/16/22 0940   diazepam (VALIUM) tablet 5 mg  5 mg Oral BID Parks Ranger, DO   5 mg at 09/16/22 6301   FLUoxetine (PROZAC) capsule 20 mg  20 mg Oral Daily Parks Ranger, DO   20 mg at 09/16/22 6010   LORazepam (ATIVAN) injection 0-4 mg  0-4 mg Intravenous Q6H Parks Ranger, DO       Or   LORazepam (ATIVAN) tablet 0-4 mg  0-4 mg Oral Q6H Parks Ranger, DO   1 mg at 09/16/22 0941   [START ON 09/17/2022] LORazepam (ATIVAN) injection 0-4 mg   0-4 mg Intravenous Q12H Parks Ranger, DO       Or   Derrill Memo ON 09/17/2022] LORazepam (ATIVAN) tablet 0-4 mg  0-4 mg Oral Q12H Langford Carias Percell Miller, DO       magnesium hydroxide (MILK OF MAGNESIA) suspension 30 mL  30 mL Oral Daily PRN Parks Ranger, DO       mirtazapine (REMERON SOL-TAB) disintegrating tablet 15 mg  15 mg Oral QHS Parks Ranger, DO   15 mg at 09/15/22 2102   nicotine (NICODERM CQ - dosed in mg/24 hours) patch 21 mg  21 mg Transdermal Daily Parks Ranger, DO   21 mg at 09/16/22 0939   OLANZapine (ZYPREXA) tablet 10 mg  10 mg Oral Q6H PRN Parks Ranger, DO       thiamine (VITAMIN B1) tablet 100 mg  100 mg Oral Daily Parks Ranger, DO   100 mg at 09/16/22 3267   Or   thiamine (VITAMIN B1) injection 100 mg  100 mg Intravenous Daily Parks Ranger, DO       traZODone (DESYREL) tablet 100 mg  100 mg Oral QHS Parks Ranger, DO   100 mg at 09/15/22 2102   PTA Medications: Medications Prior to Admission  Medication Sig Dispense Refill Last Dose   acetaminophen (TYLENOL) 500 MG tablet Take 500 mg by mouth every 6 (six) hours as needed for mild pain or moderate pain. (Patient not taking: Reported on 11/14/2021)      amLODipine (NORVASC) 5 MG tablet Take 1 tablet (5 mg total) by mouth daily. 30 tablet 3    aspirin EC 81 MG EC tablet Take 1 tablet (81 mg total) by mouth daily. Swallow whole. (Patient not taking: Reported on 11/14/2021) 30 tablet 11    atorvastatin (LIPITOR) 80 MG tablet Take 1 tablet (80 mg total) by mouth daily. 90 tablet 1    clopidogrel (PLAVIX) 75 MG tablet Take 75 mg by mouth daily.      FLUoxetine (PROZAC) 20 MG tablet Take 1 tablet (20 mg total) by mouth daily. 30 tablet 3    traZODone (DESYREL) 150 MG tablet Take 0.5 tablets (75 mg total) by mouth at bedtime. 30 tablet 1     Musculoskeletal: Strength & Muscle Tone: within normal limits Gait & Station: normal Patient leans:  N/A            Psychiatric Specialty Exam:  Presentation  General Appearance:  Casual; Disheveled; Other (comment) (Emaciated, thin.)  Eye Contact: Good  Speech: Clear and Coherent; Normal Rate  Speech Volume: Normal  Handedness: Right   Mood and Affect  Mood: Depressed; Anxious; Angry; Hopeless; Worthless  Affect: Congruent; Depressed; Tearful   Thought Process  Thought Processes: Coherent; Goal Directed; Linear  Duration of Psychotic Symptoms:N/A Past Diagnosis of Schizophrenia or Psychoactive disorder: No data recorded Descriptions of Associations:Intact  Orientation:Full (Time, Place and Person)  Thought Content:Logical  Hallucinations:Hallucinations: None  Ideas of Reference:None  Suicidal Thoughts:Suicidal Thoughts: No  Homicidal Thoughts:Homicidal Thoughts: No   Sensorium  Memory: Immediate Good; Recent Good; Remote Good  Judgment: Intact  Insight: Present   Executive Functions  Concentration: Good  Attention Span: Good  Recall: Good  Fund of Knowledge: Good  Language: Good   Psychomotor Activity  Psychomotor Activity: Psychomotor Activity: Normal   Assets  Assets: Communication Skills; Desire for Improvement; Physical Health   Sleep  Sleep: Sleep: Good    Physical Exam: Physical Exam Constitutional:      Appearance: Normal appearance.  HENT:     Head: Normocephalic and atraumatic.     Mouth/Throat:     Pharynx: Oropharynx is clear.  Eyes:     Pupils: Pupils are equal, round, and reactive to light.  Cardiovascular:     Rate and Rhythm: Normal rate and regular rhythm.  Pulmonary:     Effort: Pulmonary effort is normal.     Breath sounds: Normal breath sounds.  Abdominal:     General: Abdomen is flat.     Palpations: Abdomen is soft.  Musculoskeletal:        General:  Normal range of motion.  Skin:    General: Skin is warm and dry.  Neurological:     General: No focal deficit present.      Mental Status: She is alert. Mental status is at baseline.  Psychiatric:        Attention and Perception: Attention and perception normal.        Mood and Affect: Mood is anxious and depressed. Affect is flat.        Speech: Speech normal.        Behavior: Behavior is agitated. Behavior is cooperative.        Cognition and Memory: Cognition and memory normal.        Judgment: Judgment is impulsive and inappropriate.    Review of Systems  Constitutional: Negative.   HENT: Negative.    Eyes: Negative.   Respiratory: Negative.    Cardiovascular: Negative.   Gastrointestinal: Negative.   Genitourinary: Negative.   Musculoskeletal: Negative.   Skin: Negative.   Neurological: Negative.   Endo/Heme/Allergies: Negative.   Psychiatric/Behavioral:  Positive for depression. The patient is nervous/anxious.    Blood pressure 136/83, pulse 93, temperature 97.9 F (36.6 C), temperature source Oral, resp. rate 16, height '5\' 6"'$  (1.676 m), weight 59.4 kg, SpO2 94 %. Body mass index is 21.14 kg/m.  Treatment Plan Summary: Daily contact with patient to assess and evaluate symptoms and progress in treatment, Medication management, and Plan see orders  Observation Level/Precautions:  Detox 15 minute checks  Laboratory:  CBC Chemistry Profile  Psychotherapy:    Medications:    Consultations:    Discharge Concerns:    Estimated LOS:  Other:     Physician Treatment Plan for Primary Diagnosis: MDD (major depressive disorder), recurrent severe, without psychosis ([place]) Long Term Goal(s): Improvement in symptoms so as ready for discharge  Short Term Goals: Ability to identify changes in lifestyle to reduce recurrence of condition will improve, Ability to verbalize feelings will improve, Ability to disclose and discuss suicidal ideas, Ability to demonstrate self-control will improve, Ability to identify and develop effective coping behaviors will improve, Ability to maintain clinical measurements  within normal limits will improve, Compliance with prescribed medications will improve, and Ability to identify triggers associated with substance abuse/mental health issues will improve  Physician Treatment Plan for Secondary Diagnosis: Principal Problem:   MDD (major depressive disorder), recurrent severe, without psychosis ([place])    I certify that inpatient services furnished can reasonably be expected to improve the patient's condition.    Parks Ranger, DO 1/24/20249:41 AM

## 2022-09-16 NOTE — Progress Notes (Deleted)
   09/15/22 2200  Psych Admission Type (Psych Patients Only)  Admission Status Voluntary  Psychosocial Assessment  Patient Complaints  (alcohol abuse)  Eye Contact Brief  Facial Expression Flat  Affect Sad  Speech Slow  Interaction Minimal  Motor Activity Other (Comment) (lying in the bed)  Appearance/Hygiene Disheveled  Behavior Characteristics Appropriate to situation  Mood Sad;Depressed  Aggressive Behavior  Effect No apparent injury  Thought Process  Coherency WDL  Content WDL  Delusions None reported or observed  Perception WDL  Hallucination None reported or observed  Judgment Impaired  Confusion None  Danger to Self  Current suicidal ideation? Passive  Danger to Others  Danger to Others None reported or observed

## 2022-09-17 DIAGNOSIS — F332 Major depressive disorder, recurrent severe without psychotic features: Secondary | ICD-10-CM | POA: Diagnosis not present

## 2022-09-17 NOTE — Group Note (Signed)
[facility] LCSW Group Therapy Note   Group Date: 09/17/2022 Start Time: 2023 End Time: 1345   Type of Therapy/Topic:  Group Therapy:  Balance in Life  Participation Level:  Did Not Attend   Description of Group:    This group will address the concept of balance and how it feels and looks when one is unbalanced. Patients will be encouraged to process areas in their lives that are out of balance, and identify reasons for remaining unbalanced. Facilitators will guide patients utilizing problem- solving interventions to address and correct the stressor making their life unbalanced. Understanding and applying boundaries will be explored and addressed for obtaining  and maintaining a balanced life. Patients will be encouraged to explore ways to assertively make their unbalanced needs known to significant others in their lives, using other group members and facilitator for support and feedback.  Therapeutic Goals: Patient will identify two or more emotions or situations they have that consume much of in their lives. Patient will identify signs/triggers that life has become out of balance:  Patient will identify two ways to set boundaries in order to achieve balance in their lives:  Patient will demonstrate ability to communicate their needs through discussion and/or role plays  Summary of Patient Progress:  X    Therapeutic Modalities:   Cognitive Behavioral Therapy Solution-Focused Therapy Assertiveness Training   [place], LCSWA

## 2022-09-17 NOTE — Plan of Care (Signed)
  Problem: Education: Goal: Knowledge of General Education information will improve Description: Including pain rating scale, medication(s)/side effects and non-pharmacologic comfort measures Outcome: Progressing   Problem: Health Behavior/Discharge Planning: Goal: Ability to manage health-related needs will improve Outcome: Progressing   Problem: Clinical Measurements: Goal: Ability to maintain clinical measurements within normal limits will improve Outcome: Progressing Goal: Will remain free from infection Outcome: Progressing  Patient compliant with medications interacting well with Peers and staff. No adverse drug noted. Patient requesting about discharge stated she would like to go and smoke a real cigarette. Q 15 minutes safety checks ongoing. Patient remains safe.
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
## 2022-09-17 NOTE — BHH Suicide Risk Assessment (Signed)
[facility] INPATIENT:  Family/Significant Other Suicide Prevention Education  Suicide Prevention Education:  Patient Refusal for Family/Significant Other Suicide Prevention Education: The patient Dorothy [name] has refused to provide written consent for family/significant other to be provided Family/Significant Other Suicide Prevention Education during admission and/or prior to discharge.  Physician notified.  SPE completed with pt, as pt refused to consent to family contact. SPI pamphlet provided to pt and pt was encouraged to share information with support network, ask questions, and talk about any concerns relating to SPE. Pt denies access to guns/firearms and verbalized understanding of information provided. Mobile Crisis information also provided to pt.   Simon Llamas A [place] 09/17/2022, 2:50 PM

## 2022-09-17 NOTE — Progress Notes (Signed)
Patient denies SI, HI, AVH, and pain. She also denies withdrawal symptoms. Patient is compliant with scheduled medications. She is observed to interact well with staff and other patients. Patient engaged in group and returned to her room after group and snack. Support and encouragement provided. Patient remains safe on the unit at this time.

## 2022-09-17 NOTE — BHH Group Notes (Signed)
[facility] Group Notes:  (Nursing/MHT/Case Management/Adjunct)  Date:  09/17/2022  Time:  6:34 PM  Type of Therapy:   Breathing Techniques   Participation Level:  Active  Participation Quality:  Appropriate  Affect:  Appropriate  Cognitive:  Alert and Appropriate  Insight:  Appropriate  Engagement in Group:  Engaged  Modes of Intervention:  Activity, Discussion, and Education  Summary of Progress/Problems:  Dorothy [name] 09/17/2022, 6:34 PM

## 2022-09-17 NOTE — Progress Notes (Signed)
[facility] MD Progress Note  09/17/2022 11:32 AM Dorothy [name]  MRN:  [id number] Subjective: Dorothy [name] is seen on rounds.  She says that she is overly tired.  She has been compliant with medications.  No other side effects.  No withdrawal symptoms.  Vital signs remained stable.  I took her off of Remeron yesterday because of complaints of being too tired.  I told her if it continues over the next day or 2 we will make some more changes.  She is able to contract for safety in the hospital.  Social work is working on follow-up. Principal Problem: MDD (major depressive disorder), recurrent severe, without psychosis ([place]) Diagnosis: Principal Problem:   MDD (major depressive disorder), recurrent severe, without psychosis ([place])  Total Time spent with patient: 15 minutes  Past Psychiatric History: :Hx of Alcohol dependence, anxiety disorder, Nicotine use disorder and Major Depressive disorde. No inpatient Psychiatric hospitalization except two rehab treatment. No hx of suicide attempts.    Past Medical History:  Past Medical History:  Diagnosis Date   Alcoholic ([place])    Chronic bronchitis ([place])    Still smoking   Depression    GERD (gastroesophageal reflux disease)    Hoarseness 06/2010   Oral thrush---- Dr. Melida Quitter   Hypertension    Insomnia    Malignant melanoma ([place]) 2009   Removed from left shoulder    Past Surgical History:  Procedure Laterality Date   BASAL CELL CARCINOMA EXCISION  2012   Right side nose    BREAST ENHANCEMENT SURGERY  2002   ELBOW SURGERY     Right elbow surgery---Ruptured tendon   Family History:  Family History  Problem Relation Age of Onset   Cirrhosis Father     Social History:  Social History   Substance and Sexual Activity  Alcohol Use Yes   Comment: >1 pint vodka daily and wine     Social History   Substance and Sexual Activity  Drug Use Yes   Types: Marijuana    Social History   Socioeconomic History   Marital status: Divorced     Spouse name: Not on file   Number of children: Not on file   Years of education: Not on file   Highest education level: Not on file  Occupational History   Not on file  Tobacco Use   Smoking status: Every Day    Packs/day: 1.50    Types: Cigarettes    Start date: 08/24/1972    Passive exposure: Current   Smokeless tobacco: Never  Vaping Use   Vaping Use: Never used  Substance and Sexual Activity   Alcohol use: Yes    Comment: >1 pint vodka daily and wine   Drug use: Yes    Types: Marijuana   Sexual activity: Not on file  Other Topics Concern   Not on file  Social History Narrative   Not on file   Social Determinants of Health   Financial Resource Strain: Not on file  Food Insecurity: No Food Insecurity (09/16/2022)   Hunger Vital Sign    Worried About Running Out of Food in the Last Year: Never true    Ran Out of Food in the Last Year: Never true  Transportation Needs: No Transportation Needs (09/16/2022)   PRAPARE - Hydrologist (Medical): No    Lack of Transportation (Non-Medical): No  Physical Activity: Not on file  Stress: Not on file  Social Connections: Not on file   Additional  Social History:                         Sleep: Good  Appetite:  Good  Current Medications: Current Facility-Administered Medications  Medication Dose Route Frequency Provider Last Rate Last Admin   acetaminophen (TYLENOL) tablet 650 mg  650 mg Oral Q6H PRN Parks Ranger, DO       alum & mag hydroxide-simeth (MAALOX/MYLANTA) 200-200-20 MG/5ML suspension 30 mL  30 mL Oral Q4H PRN Parks Ranger, DO       amLODipine (NORVASC) tablet 10 mg  10 mg Oral Daily Parks Ranger, DO   10 mg at 09/17/22 1048   aspirin EC tablet 81 mg  81 mg Oral Daily Parks Ranger, DO   81 mg at 09/17/22 1048   atorvastatin (LIPITOR) tablet 80 mg  80 mg Oral QHS Parks Ranger, DO   80 mg at 09/16/22 2113   clopidogrel (PLAVIX)  tablet 75 mg  75 mg Oral Daily Parks Ranger, DO   75 mg at 09/17/22 1048   diazepam (VALIUM) tablet 5 mg  5 mg Oral BID Parks Ranger, DO   5 mg at 09/17/22 1048   FLUoxetine (PROZAC) capsule 20 mg  20 mg Oral Daily Parks Ranger, DO   20 mg at 38/18/29 9371   folic acid (FOLVITE) tablet 1 mg  1 mg Oral Daily Parks Ranger, DO   1 mg at 09/17/22 1048   LORazepam (ATIVAN) injection 0-4 mg  0-4 mg Intravenous Q12H Parks Ranger, DO       Or   LORazepam (ATIVAN) tablet 0-4 mg  0-4 mg Oral Q12H Lacye Mccarn Percell Miller, DO       magnesium hydroxide (MILK OF MAGNESIA) suspension 30 mL  30 mL Oral Daily PRN Parks Ranger, DO       nicotine (NICODERM CQ - dosed in mg/24 hours) patch 21 mg  21 mg Transdermal Daily Parks Ranger, DO   21 mg at 09/17/22 1048   OLANZapine (ZYPREXA) tablet 10 mg  10 mg Oral Q6H PRN Parks Ranger, DO       risperiDONE (RISPERDAL) tablet 0.5 mg  0.5 mg Oral [id number] Parks Ranger, DO   0.5 mg at 09/17/22 0809   thiamine (VITAMIN B1) tablet 100 mg  100 mg Oral Daily Parks Ranger, DO   100 mg at 09/17/22 1048   Or   thiamine (VITAMIN B1) injection 100 mg  100 mg Intravenous Daily Parks Ranger, DO       traZODone (DESYREL) tablet 150 mg  150 mg Oral QHS Parks Ranger, DO   150 mg at 09/16/22 2112    Lab Results: No results found for this or any previous visit (from the past 48 hour(s)).  Blood Alcohol level:  Lab Results  Component Value Date   ETH [facility]) 09/15/2022   ETH <10 69/67/8938    Metabolic Disorder Labs: Lab Results  Component Value Date   HGBA1C 5.5 10/16/2021   MPG 111 10/16/2021   No results found for: "PROLACTIN" Lab Results  Component Value Date   CHOL 217 (H) 10/16/2021   TRIG 96 10/16/2021   HDL 43 10/16/2021   CHOLHDL 5.0 10/16/2021   VLDL 19 10/16/2021   LDLCALC 155 (H) 10/16/2021    Physical Findings: AIMS:  , ,  ,   ,    CIWA:  CIWA-Ar Total: 3 COWS:  Musculoskeletal: Strength & Muscle Tone: within normal limits Gait & Station: normal Patient leans: N/A  Psychiatric Specialty Exam:  Presentation  General Appearance:  Casual; Disheveled; Other (comment) (Emaciated, thin.)  Eye Contact: Good  Speech: Clear and Coherent; Normal Rate  Speech Volume: Normal  Handedness: Right   Mood and Affect  Mood: Depressed; Anxious; Angry; Hopeless; Worthless  Affect: Congruent; Depressed; Tearful   Thought Process  Thought Processes: Coherent; Goal Directed; Linear  Descriptions of Associations:Intact  Orientation:Full (Time, Place and Person)  Thought Content:Logical  History of Schizophrenia/Schizoaffective disorder:No data recorded Duration of Psychotic Symptoms:No data recorded Hallucinations:No data recorded Ideas of Reference:None  Suicidal Thoughts:No data recorded Homicidal Thoughts:No data recorded  Sensorium  Memory: Immediate Good; Recent Good; Remote Good  Judgment: Intact  Insight: Present   Executive Functions  Concentration: Good  Attention Span: Good  Recall: Good  Fund of Knowledge: Good  Language: Good   Psychomotor Activity  Psychomotor Activity:No data recorded  Assets  Assets: Communication Skills; Desire for Improvement; Physical Health   Sleep  Sleep:No data recorded   Physical Exam: Physical Exam Vitals and nursing note reviewed.  Constitutional:      Appearance: Normal appearance. She is normal weight.  Neurological:     General: No focal deficit present.     Mental Status: She is alert and oriented to person, place, and time.  Psychiatric:        Attention and Perception: Attention and perception normal.        Mood and Affect: Mood is depressed. Affect is flat.        Speech: Speech normal.        Behavior: Behavior normal. Behavior is cooperative.        Thought Content: Thought content normal.         Cognition and Memory: Cognition and memory normal.        Judgment: Judgment normal.    Review of Systems  Constitutional: Negative.   HENT: Negative.    Eyes: Negative.   Respiratory: Negative.    Cardiovascular: Negative.   Gastrointestinal: Negative.   Genitourinary: Negative.   Musculoskeletal: Negative.   Skin: Negative.   Neurological: Negative.   Endo/Heme/Allergies: Negative.   Psychiatric/Behavioral:  Positive for depression.    Blood pressure 130/85, pulse 96, temperature 98.2 F (36.8 C), temperature source Oral, resp. rate 18, height '5\' 6"'$  (1.676 m), weight 59.4 kg, SpO2 94 %. Body mass index is 21.14 kg/m.   Treatment Plan Summary: Daily contact with patient to assess and evaluate symptoms and progress in treatment, Medication management, and Plan continue current medications.  Parks Ranger, DO 09/17/2022, 11:32 AM

## 2022-09-17 NOTE — Plan of Care (Signed)
D- Patient alert and oriented x 4. Patient denies SI, HI, AVH, and pain. Patient denies anxiety and depression. Patient states that her goal is to be discharged home. Patient vitals good and stable. Patient engaging with Probation officer and others.  A- Scheduled medications administered to patient, per MD orders. Support and encouragement provided.  Routine safety checks conducted every 15 minutes.  Patient informed to notify staff with problems or concerns.  R- No adverse drug reactions noted. Patient contracts for safety at this time. Patient compliant with medications and treatment plan. Patient receptive, calm, and cooperative. Patient interacts well with others on the unit.  Patient participated in group. Patient remains safe at this time.  Problem: Education: Goal: Knowledge of General Education information will improve Description: Including pain rating scale, medication(s)/side effects and non-pharmacologic comfort measures Outcome: Progressing   Problem: Health Behavior/Discharge Planning: Goal: Ability to manage health-related needs will improve Outcome: Progressing   Problem: Clinical Measurements: Goal: Ability to maintain clinical measurements within normal limits will improve Outcome: Progressing Goal: Diagnostic test results will improve Outcome: Progressing Goal: Cardiovascular complication will be avoided Outcome: Progressing   Problem: Nutrition: Goal: Adequate nutrition will be maintained Outcome: Progressing   Problem: Coping: Goal: Level of anxiety will decrease Outcome: Progressing   Problem: Pain Managment: Goal: General experience of comfort will improve Outcome: Progressing

## 2022-09-18 DIAGNOSIS — F332 Major depressive disorder, recurrent severe without psychotic features: Secondary | ICD-10-CM | POA: Diagnosis not present

## 2022-09-18 MED ORDER — RISPERIDONE 1 MG PO TABS
1.0000 mg | ORAL_TABLET | Freq: Every day | ORAL | Status: DC
  Administered 2022-09-19 – 2022-09-21 (×3): 1 mg via ORAL
  Filled 2022-09-18 (×3): qty 1

## 2022-09-18 MED ORDER — MENTHOL 3 MG MT LOZG: 1.0000 | LOZENGE | OROMUCOSAL | Status: DC | PRN

## 2022-09-18 NOTE — Plan of Care (Signed)
D- Patient alert and oriented x 4. Patient needy. Patient denies SI, HI, AVH, and pain. Patient at the nurses station asking to cut a straw smaller to act like she has a cigarette. This Probation officer reminded patient that behavior is not appropriate for the unit. This Probation officer educated patient on alcohol use and withdrawal as well of its effect on blood pressure and blood sugar.  A- Scheduled medications administered to patient, per MD orders. Support and encouragement provided.  Routine safety checks conducted every 15 minutes.  Patient informed to notify staff with problems or concerns.  R- No adverse drug reactions noted. Patient contracts for safety at this time. Patient compliant with medications and treatment plan. Patient receptive, calm, and cooperative. Patient interacts well with others on the unit.  Patient remains safe at this time.  Problem: Education: Goal: Knowledge of General Education information will improve Description: Including pain rating scale, medication(s)/side effects and non-pharmacologic comfort measures Outcome: Progressing   Problem: Health Behavior/Discharge Planning: Goal: Ability to manage health-related needs will improve Outcome: Progressing   Problem: Clinical Measurements: Goal: Ability to maintain clinical measurements within normal limits will improve Outcome: Progressing Goal: Will remain free from infection Outcome: Progressing   Problem: Nutrition: Goal: Adequate nutrition will be maintained Outcome: Progressing   Problem: Coping: Goal: Level of anxiety will decrease Outcome: Progressing   Problem: Pain Managment: Goal: General experience of comfort will improve Outcome: Progressing

## 2022-09-18 NOTE — Progress Notes (Signed)
[facility] MD Progress Note  09/18/2022 12:43 PM Dorothy [name]  MRN:  [id number] Subjective: Dorothy [name] is seen on rounds.  She is still complaining of daytime sleepiness.  Her vital signs have remained stable and her blood pressure has been creeping up.  She denies any suicidal ideation.  I told her I will switch her Risperdal to all at bedtime.  Other than that she denies any side effects. Principal Problem: MDD (major depressive disorder), recurrent severe, without psychosis ([place]) Diagnosis: Principal Problem:   MDD (major depressive disorder), recurrent severe, without psychosis ([place])  Total Time spent with patient: 15 minutes  Past Psychiatric History: Hx of Alcohol dependence, anxiety disorder, Nicotine use disorder and Major Depressive disorde. No inpatient Psychiatric hospitalization except two rehab treatment. No hx of suicide attempts.     Past Medical History:  Past Medical History:  Diagnosis Date   Alcoholic ([place])    Chronic bronchitis ([place])    Still smoking   Depression    GERD (gastroesophageal reflux disease)    Hoarseness 06/2010   Oral thrush---- Dr. Melida Quitter   Hypertension    Insomnia    Malignant melanoma ([place]) 2009   Removed from left shoulder    Past Surgical History:  Procedure Laterality Date   BASAL CELL CARCINOMA EXCISION  2012   Right side nose    BREAST ENHANCEMENT SURGERY  2002   ELBOW SURGERY     Right elbow surgery---Ruptured tendon   Family History:  Family History  Problem Relation Age of Onset   Cirrhosis Father     Social History:  Social History   Substance and Sexual Activity  Alcohol Use Yes   Comment: >1 pint vodka daily and wine     Social History   Substance and Sexual Activity  Drug Use Yes   Types: Marijuana    Social History   Socioeconomic History   Marital status: Divorced    Spouse name: Not on file   Number of children: Not on file   Years of education: Not on file   Highest education level: Not on file   Occupational History   Not on file  Tobacco Use   Smoking status: Every Day    Packs/day: 1.50    Types: Cigarettes    Start date: 08/24/1972    Passive exposure: Current   Smokeless tobacco: Never  Vaping Use   Vaping Use: Never used  Substance and Sexual Activity   Alcohol use: Yes    Comment: >1 pint vodka daily and wine   Drug use: Yes    Types: Marijuana   Sexual activity: Not on file  Other Topics Concern   Not on file  Social History Narrative   Not on file   Social Determinants of Health   Financial Resource Strain: Not on file  Food Insecurity: No Food Insecurity (09/16/2022)   Hunger Vital Sign    Worried About Running Out of Food in the Last Year: Never true    Ran Out of Food in the Last Year: Never true  Transportation Needs: No Transportation Needs (09/16/2022)   PRAPARE - Hydrologist (Medical): No    Lack of Transportation (Non-Medical): No  Physical Activity: Not on file  Stress: Not on file  Social Connections: Not on file   Additional Social History:                         Sleep: Good  Appetite:  Good  Current Medications: Current Facility-Administered Medications  Medication Dose Route Frequency Provider Last Rate Last Admin   acetaminophen (TYLENOL) tablet 650 mg  650 mg Oral Q6H PRN Parks Ranger, DO       alum & mag hydroxide-simeth (MAALOX/MYLANTA) 200-200-20 MG/5ML suspension 30 mL  30 mL Oral Q4H PRN Parks Ranger, DO       amLODipine (NORVASC) tablet 10 mg  10 mg Oral Daily Parks Ranger, DO   10 mg at 09/18/22 1517   aspirin EC tablet 81 mg  81 mg Oral Daily Parks Ranger, DO   81 mg at 09/18/22 0902   atorvastatin (LIPITOR) tablet 80 mg  80 mg Oral QHS Parks Ranger, DO   80 mg at 09/17/22 2117   clopidogrel (PLAVIX) tablet 75 mg  75 mg Oral Daily Parks Ranger, DO   75 mg at 09/18/22 6160   diazepam (VALIUM) tablet 5 mg  5 mg Oral BID  Parks Ranger, DO   5 mg at 09/18/22 7371   FLUoxetine (PROZAC) capsule 20 mg  20 mg Oral Daily Parks Ranger, DO   20 mg at 02/16/93 8546   folic acid (FOLVITE) tablet 1 mg  1 mg Oral Daily Parks Ranger, DO   1 mg at 09/18/22 2703   LORazepam (ATIVAN) injection 0-4 mg  0-4 mg Intravenous Q12H Parks Ranger, DO       Or   LORazepam (ATIVAN) tablet 0-4 mg  0-4 mg Oral Q12H Parks Ranger, DO       magnesium hydroxide (MILK OF MAGNESIA) suspension 30 mL  30 mL Oral Daily PRN Parks Ranger, DO       nicotine (NICODERM CQ - dosed in mg/24 hours) patch 21 mg  21 mg Transdermal Daily Parks Ranger, DO   21 mg at 09/18/22 0903   OLANZapine (ZYPREXA) tablet 10 mg  10 mg Oral Q6H PRN Parks Ranger, DO       [START ON 09/19/2022] risperiDONE (RISPERDAL) tablet 1 mg  1 mg Oral QHS Parks Ranger, DO       thiamine (VITAMIN B1) tablet 100 mg  100 mg Oral Daily Parks Ranger, DO   100 mg at 09/18/22 5009   Or   thiamine (VITAMIN B1) injection 100 mg  100 mg Intravenous Daily Parks Ranger, DO       traZODone (DESYREL) tablet 150 mg  150 mg Oral QHS Parks Ranger, DO   150 mg at 09/17/22 2117    Lab Results: No results found for this or any previous visit (from the past 48 hour(s)).  Blood Alcohol level:  Lab Results  Component Value Date   ETH [facility]) 09/15/2022   ETH <10 38/18/2993    Metabolic Disorder Labs: Lab Results  Component Value Date   HGBA1C 5.5 10/16/2021   MPG 111 10/16/2021   No results found for: "PROLACTIN" Lab Results  Component Value Date   CHOL 217 (H) 10/16/2021   TRIG 96 10/16/2021   HDL 43 10/16/2021   CHOLHDL 5.0 10/16/2021   VLDL 19 10/16/2021   LDLCALC 155 (H) 10/16/2021    Physical Findings: AIMS:  , ,  ,  ,    CIWA:  CIWA-Ar Total: 1 COWS:     Musculoskeletal: Strength & Muscle Tone: within normal limits Gait & Station: normal Patient  leans: N/A  Psychiatric Specialty Exam:  Presentation  General Appearance:  Casual; Disheveled; Other (comment) (Emaciated, thin.)  Eye Contact: Good  Speech: Clear and Coherent; Normal Rate  Speech Volume: Normal  Handedness: Right   Mood and Affect  Mood: Depressed; Anxious; Angry; Hopeless; Worthless  Affect: Congruent; Depressed; Tearful   Thought Process  Thought Processes: Coherent; Goal Directed; Linear  Descriptions of Associations:Intact  Orientation:Full (Time, Place and Person)  Thought Content:Logical  History of Schizophrenia/Schizoaffective disorder:No data recorded Duration of Psychotic Symptoms:No data recorded Hallucinations:No data recorded Ideas of Reference:None  Suicidal Thoughts:No data recorded Homicidal Thoughts:No data recorded  Sensorium  Memory: Immediate Good; Recent Good; Remote Good  Judgment: Intact  Insight: Present   Executive Functions  Concentration: Good  Attention Span: Good  Recall: Good  Fund of Knowledge: Good  Language: Good   Psychomotor Activity  Psychomotor Activity:No data recorded  Assets  Assets: Communication Skills; Desire for Improvement; Physical Health   Sleep  Sleep:No data recorded   Physical Exam: Physical Exam Vitals and nursing note reviewed.  Constitutional:      Appearance: Normal appearance. She is normal weight.  Neurological:     General: No focal deficit present.     Mental Status: She is alert and oriented to person, place, and time.  Psychiatric:        Attention and Perception: Attention and perception normal.        Mood and Affect: Mood is depressed. Affect is flat.        Speech: Speech normal.        Behavior: Behavior normal. Behavior is cooperative.        Thought Content: Thought content normal.        Cognition and Memory: Cognition and memory normal.        Judgment: Judgment normal.    Review of Systems  Constitutional: Negative.    HENT: Negative.    Eyes: Negative.   Respiratory: Negative.    Cardiovascular: Negative.   Gastrointestinal: Negative.   Genitourinary: Negative.   Musculoskeletal: Negative.   Skin: Negative.   Neurological: Negative.   Endo/Heme/Allergies: Negative.   Psychiatric/Behavioral:  Positive for depression.    Blood pressure (!) 139/94, pulse 88, temperature 97.7 F (36.5 C), temperature source Oral, resp. rate 18, height '5\' 6"'$  (1.676 m), weight 59.4 kg, SpO2 96 %. Body mass index is 21.14 kg/m.   Treatment Plan Summary: Daily contact with patient to assess and evaluate symptoms and progress in treatment, Medication management, and Plan change Risperdal to 1 mg at bedtime.  Parks Ranger, DO 09/18/2022, 12:43 PM

## 2022-09-19 DIAGNOSIS — F332 Major depressive disorder, recurrent severe without psychotic features: Secondary | ICD-10-CM | POA: Diagnosis not present

## 2022-09-19 MED ORDER — DIAZEPAM 2 MG PO TABS
2.0000 mg | ORAL_TABLET | Freq: Two times a day (BID) | ORAL | Status: DC
  Administered 2022-09-19 – 2022-09-20 (×2): 2 mg via ORAL
  Filled 2022-09-19 (×2): qty 1

## 2022-09-19 NOTE — Plan of Care (Signed)
Pt denies anxiety/depression at this time. Pt denies SI/HI/AVH or pain at this time. Pt is calm and cooperative. Pt is medication compliant. Pt provided with support and encouragement. Pt monitored q15 minutes for safety per unit policy. Plan of care ongoing.   Problem: Education: Goal: Knowledge of General Education information will improve Description: Including pain rating scale, medication(s)/side effects and non-pharmacologic comfort measures Outcome: Progressing   Problem: Coping: Goal: Level of anxiety will decrease Outcome: Progressing

## 2022-09-19 NOTE — Progress Notes (Signed)
Patient is alert and oriented times 4. Mood and affect appropriate. Patient rates pain as 0/10. She denies SI, HI, and AVH. Patient does endorse feelings of anxiety and depression at this time. Patient also complains of itching on the right side of her hip, stating "I have welts"  Patient advised to show the doctor on rounds so he can put an order in for medication if necessary. Patient states she slept well last night. Evening medicines administered whole by mouth without difficulty. Patient ate snack in day room; appetite was fair. Patient remains on unit with Q15 minute checks in place.

## 2022-09-19 NOTE — Progress Notes (Signed)
[facility] MD Progress Note  09/19/2022 1:27 PM Dorothy [name]  MRN:  [id number] Subjective: Patient is seen on rounds.  She has a cold sore.  She has no withdrawal symptoms going to stop her Ativan protocol.  Her mood is improving.  She is tolerating medications.  No side effects. Principal Problem: MDD (major depressive disorder), recurrent severe, without psychosis ([place]) Diagnosis: Principal Problem:   MDD (major depressive disorder), recurrent severe, without psychosis ([place])  Total Time spent with patient: 15 minutes  Past Psychiatric History: Depression and alcohol dependence  Past Medical History:  Past Medical History:  Diagnosis Date   Alcoholic ([place])    Chronic bronchitis ([place])    Still smoking   Depression    GERD (gastroesophageal reflux disease)    Hoarseness 06/2010   Oral thrush---- Dr. Melida Quitter   Hypertension    Insomnia    Malignant melanoma ([place]) 2009   Removed from left shoulder    Past Surgical History:  Procedure Laterality Date   BASAL CELL CARCINOMA EXCISION  2012   Right side nose    BREAST ENHANCEMENT SURGERY  2002   ELBOW SURGERY     Right elbow surgery---Ruptured tendon   Family History:  Family History  Problem Relation Age of Onset   Cirrhosis Father    Family Psychiatric  History: Unremarkable Social History:  Social History   Substance and Sexual Activity  Alcohol Use Yes   Comment: >1 pint vodka daily and wine     Social History   Substance and Sexual Activity  Drug Use Yes   Types: Marijuana    Social History   Socioeconomic History   Marital status: Divorced    Spouse name: Not on file   Number of children: Not on file   Years of education: Not on file   Highest education level: Not on file  Occupational History   Not on file  Tobacco Use   Smoking status: Every Day    Packs/day: 1.50    Types: Cigarettes    Start date: 08/24/1972    Passive exposure: Current   Smokeless tobacco: Never  Vaping Use   Vaping Use:  Never used  Substance and Sexual Activity   Alcohol use: Yes    Comment: >1 pint vodka daily and wine   Drug use: Yes    Types: Marijuana   Sexual activity: Not on file  Other Topics Concern   Not on file  Social History Narrative   Not on file   Social Determinants of Health   Financial Resource Strain: Not on file  Food Insecurity: No Food Insecurity (09/16/2022)   Hunger Vital Sign    Worried About Running Out of Food in the Last Year: Never true    Ran Out of Food in the Last Year: Never true  Transportation Needs: No Transportation Needs (09/16/2022)   PRAPARE - Hydrologist (Medical): No    Lack of Transportation (Non-Medical): No  Physical Activity: Not on file  Stress: Not on file  Social Connections: Not on file   Additional Social History:                         Sleep: Good  Appetite:  Good  Current Medications: Current Facility-Administered Medications  Medication Dose Route Frequency Provider Last Rate Last Admin   acetaminophen (TYLENOL) tablet 650 mg  650 mg Oral Q6H PRN Parks Ranger, DO  alum & mag hydroxide-simeth (MAALOX/MYLANTA) 200-200-20 MG/5ML suspension 30 mL  30 mL Oral Q4H PRN Parks Ranger, DO       amLODipine (NORVASC) tablet 10 mg  10 mg Oral Daily Parks Ranger, DO   10 mg at 09/19/22 1050   aspirin EC tablet 81 mg  81 mg Oral Daily Parks Ranger, DO   81 mg at 09/19/22 1050   atorvastatin (LIPITOR) tablet 80 mg  80 mg Oral QHS Parks Ranger, DO   80 mg at 09/18/22 2112   clopidogrel (PLAVIX) tablet 75 mg  75 mg Oral Daily Parks Ranger, DO   75 mg at 09/19/22 1051   diazepam (VALIUM) tablet 2 mg  2 mg Oral BID Parks Ranger, DO       FLUoxetine (PROZAC) capsule 20 mg  20 mg Oral Daily Parks Ranger, DO   20 mg at 09/19/22 1051   magnesium hydroxide (MILK OF MAGNESIA) suspension 30 mL  30 mL Oral Daily PRN Parks Ranger, DO       menthol-cetylpyridinium (CEPACOL) lozenge 3 mg  1 lozenge Oral PRN Parks Ranger, DO       nicotine (NICODERM CQ - dosed in mg/24 hours) patch 21 mg  21 mg Transdermal Daily Parks Ranger, DO   21 mg at 09/19/22 1050   OLANZapine (ZYPREXA) tablet 10 mg  10 mg Oral Q6H PRN Parks Ranger, DO       risperiDONE (RISPERDAL) tablet 1 mg  1 mg Oral QHS Parks Ranger, DO       traZODone (DESYREL) tablet 150 mg  150 mg Oral QHS Parks Ranger, DO   150 mg at 09/18/22 2112    Lab Results: No results found for this or any previous visit (from the past 48 hour(s)).  Blood Alcohol level:  Lab Results  Component Value Date   ETH [facility]) 09/15/2022   ETH <10 35/00/9381    Metabolic Disorder Labs: Lab Results  Component Value Date   HGBA1C 5.5 10/16/2021   MPG 111 10/16/2021   No results found for: "PROLACTIN" Lab Results  Component Value Date   CHOL 217 (H) 10/16/2021   TRIG 96 10/16/2021   HDL 43 10/16/2021   CHOLHDL 5.0 10/16/2021   VLDL 19 10/16/2021   LDLCALC 155 (H) 10/16/2021    Physical Findings: AIMS:  , ,  ,  ,    CIWA:  CIWA-Ar Total: 0 COWS:     Musculoskeletal: Strength & Muscle Tone: within normal limits Gait & Station: normal Patient leans: N/A  Psychiatric Specialty Exam:  Presentation  General Appearance:  Casual; Disheveled; Other (comment) (Emaciated, thin.)  Eye Contact: Good  Speech: Clear and Coherent; Normal Rate  Speech Volume: Normal  Handedness: Right   Mood and Affect  Mood: Depressed; Anxious; Angry; Hopeless; Worthless  Affect: Congruent; Depressed; Tearful   Thought Process  Thought Processes: Coherent; Goal Directed; Linear  Descriptions of Associations:Intact  Orientation:Full (Time, Place and Person)  Thought Content:Logical  History of Schizophrenia/Schizoaffective disorder:No data recorded Duration of Psychotic Symptoms:No data  recorded Hallucinations:No data recorded Ideas of Reference:None  Suicidal Thoughts:No data recorded Homicidal Thoughts:No data recorded  Sensorium  Memory: Immediate Good; Recent Good; Remote Good  Judgment: Intact  Insight: Present   Executive Functions  Concentration: Good  Attention Span: Good  Recall: Good  Fund of Knowledge: Good  Language: Good   Psychomotor Activity  Psychomotor Activity:No data recorded  Assets  Assets: Armed forces logistics/support/administrative officer; Desire for Improvement; Physical Health   Sleep  Sleep:No data recorded    Blood pressure 118/80, pulse 84, temperature 97.6 F (36.4 C), temperature source Oral, resp. rate 18, height '5\' 6"'$  (1.676 m), weight 59.4 kg, SpO2 98 %. Body mass index is 21.14 kg/m.   Treatment Plan Summary: Daily contact with patient to assess and evaluate symptoms and progress in treatment, Medication management, and Plan discontinue detox protocol.  Decrease Valium to 2 mg twice a day.  [place], DO 09/19/2022, 1:27 PM

## 2022-09-20 DIAGNOSIS — F332 Major depressive disorder, recurrent severe without psychotic features: Secondary | ICD-10-CM | POA: Diagnosis not present

## 2022-09-20 MED ORDER — DIAZEPAM 2 MG PO TABS
2.0000 mg | ORAL_TABLET | Freq: Every day | ORAL | Status: AC
  Administered 2022-09-20: 2 mg via ORAL
  Filled 2022-09-20: qty 1

## 2022-09-20 NOTE — Group Note (Signed)
LCSW Group Therapy Note   Group Date: 09/20/2022 Start Time: 1400 End Time: 1440   Type of Therapy and Topic:  Group Therapy:  Cognitive Distortions  Participation Level:  Active   Description of Group:    Patients in this group will be introduced to the topic of cognitive distortions.  Patients will identify and describe cognitive distortions, describe the feelings these distortions create for them.  Patients will identify one or more situations in their personal life where they have cognitively distorted thinking and will verbalize challenging this cognitive distortion through positive thinking skills.  Patients will practice the skill of using positive affirmations to challenge cognitive distortions using affirmation cards.    Therapeutic Goals:  Patient will identify two or more cognitive distortions they have used Patient will identify one or more emotions that stem from use of a cognitive distortion Patient will demonstrate use of a positive affirmation to counter a cognitive distortion through discussion and/or role play. Patient will describe one way cognitive distortions can be detrimental to wellness   Summary of Patient Progress: Pt was active during group. Pt shared her current struggle with feeling she has no purpose in life. She shares she has been trying to find a job but, currently is unable to drive and has had no job offers. Pt was able to identify techniques to help with diffusing thoughts.      Therapeutic Modalities:   Cognitive Behavioral Therapy Motivational Interviewing   Vassie Moselle, LCSW 09/20/2022  2:48 PM

## 2022-09-20 NOTE — Plan of Care (Signed)
Pt denies anxiety/depression at this time. Pt denies SI/HI/AVH or pain at this time. Pt is calm and cooperative. Pt is medication compliant. Pt provided with support and encouragement. Pt monitored q15 minutes for safety per unit policy. Plan of care ongoing.   Problem: Coping: Goal: Coping ability will improve Outcome: Progressing   Problem: Medication: Goal: Compliance with prescribed medication regimen will improve Outcome: Progressing

## 2022-09-20 NOTE — Progress Notes (Signed)
[facility] MD Progress Note  09/20/2022 11:49 AM Dorothy [name]  MRN:  [id number] Subjective: Patient is seen on rounds.  She denies any problems.  Been compliant with medications.  Nurses report no issues.  She wants to follow up outpatient.  No side effects from medication. Principal Problem: MDD (major depressive disorder), recurrent severe, without psychosis ([place]) Diagnosis: Principal Problem:   MDD (major depressive disorder), recurrent severe, without psychosis ([place])  Total Time spent with patient: 15 minutes  Past Psychiatric History: Alcohol dependence and depression  Past Medical History:  Past Medical History:  Diagnosis Date   Alcoholic ([place])    Chronic bronchitis ([place])    Still smoking   Depression    GERD (gastroesophageal reflux disease)    Hoarseness 06/2010   Oral thrush---- Dr. Melida Quitter   Hypertension    Insomnia    Malignant melanoma ([place]) 2009   Removed from left shoulder    Past Surgical History:  Procedure Laterality Date   BASAL CELL CARCINOMA EXCISION  2012   Right side nose    BREAST ENHANCEMENT SURGERY  2002   ELBOW SURGERY     Right elbow surgery---Ruptured tendon   Family History:  Family History  Problem Relation Age of Onset   Cirrhosis Father    Family Psychiatric  History: Unremarkable Social History:  Social History   Substance and Sexual Activity  Alcohol Use Yes   Comment: >1 pint vodka daily and wine     Social History   Substance and Sexual Activity  Drug Use Yes   Types: Marijuana    Social History   Socioeconomic History   Marital status: Divorced    Spouse name: Not on file   Number of children: Not on file   Years of education: Not on file   Highest education level: Not on file  Occupational History   Not on file  Tobacco Use   Smoking status: Every Day    Packs/day: 1.50    Types: Cigarettes    Start date: 08/24/1972    Passive exposure: Current   Smokeless tobacco: Never  Vaping Use   Vaping Use: Never  used  Substance and Sexual Activity   Alcohol use: Yes    Comment: >1 pint vodka daily and wine   Drug use: Yes    Types: Marijuana   Sexual activity: Not on file  Other Topics Concern   Not on file  Social History Narrative   Not on file   Social Determinants of Health   Financial Resource Strain: Not on file  Food Insecurity: No Food Insecurity (09/16/2022)   Hunger Vital Sign    Worried About Running Out of Food in the Last Year: Never true    Ran Out of Food in the Last Year: Never true  Transportation Needs: No Transportation Needs (09/16/2022)   PRAPARE - Hydrologist (Medical): No    Lack of Transportation (Non-Medical): No  Physical Activity: Not on file  Stress: Not on file  Social Connections: Not on file   Additional Social History:                         Sleep: Good  Appetite:  Good  Current Medications: Current Facility-Administered Medications  Medication Dose Route Frequency Provider Last Rate Last Admin   acetaminophen (TYLENOL) tablet 650 mg  650 mg Oral Q6H PRN Parks Ranger, DO       alum &  mag hydroxide-simeth (MAALOX/MYLANTA) 200-200-20 MG/5ML suspension 30 mL  30 mL Oral Q4H PRN Parks Ranger, DO       amLODipine (NORVASC) tablet 10 mg  10 mg Oral Daily Parks Ranger, DO   10 mg at 09/20/22 1610   aspirin EC tablet 81 mg  81 mg Oral Daily Parks Ranger, DO   81 mg at 09/20/22 9604   atorvastatin (LIPITOR) tablet 80 mg  80 mg Oral QHS Parks Ranger, DO   80 mg at 09/19/22 2116   clopidogrel (PLAVIX) tablet 75 mg  75 mg Oral Daily Parks Ranger, DO   75 mg at 09/20/22 5409   diazepam (VALIUM) tablet 2 mg  2 mg Oral BID Parks Ranger, DO   2 mg at 09/20/22 8119   FLUoxetine (PROZAC) capsule 20 mg  20 mg Oral Daily Parks Ranger, DO   20 mg at 09/20/22 1478   magnesium hydroxide (MILK OF MAGNESIA) suspension 30 mL  30 mL Oral Daily PRN  Parks Ranger, DO       menthol-cetylpyridinium (CEPACOL) lozenge 3 mg  1 lozenge Oral PRN Parks Ranger, DO       nicotine (NICODERM CQ - dosed in mg/24 hours) patch 21 mg  21 mg Transdermal Daily Parks Ranger, DO   21 mg at 09/20/22 0906   OLANZapine (ZYPREXA) tablet 10 mg  10 mg Oral Q6H PRN Parks Ranger, DO       risperiDONE (RISPERDAL) tablet 1 mg  1 mg Oral QHS Parks Ranger, DO   1 mg at 09/19/22 2116   traZODone (DESYREL) tablet 150 mg  150 mg Oral QHS Parks Ranger, DO   150 mg at 09/19/22 2115    Lab Results: No results found for this or any previous visit (from the past 48 hour(s)).  Blood Alcohol level:  Lab Results  Component Value Date   ETH [facility]) 09/15/2022   ETH <10 29/56/2130    Metabolic Disorder Labs: Lab Results  Component Value Date   HGBA1C 5.5 10/16/2021   MPG 111 10/16/2021   No results found for: "PROLACTIN" Lab Results  Component Value Date   CHOL 217 (H) 10/16/2021   TRIG 96 10/16/2021   HDL 43 10/16/2021   CHOLHDL 5.0 10/16/2021   VLDL 19 10/16/2021   LDLCALC 155 (H) 10/16/2021    Physical Findings: AIMS:  , ,  ,  ,    CIWA:  CIWA-Ar Total: 0 COWS:     Musculoskeletal: Strength & Muscle Tone: within normal limits Gait & Station: normal Patient leans: N/A  Psychiatric Specialty Exam:  Presentation  General Appearance:  Casual; Disheveled; Other (comment) (Emaciated, thin.)  Eye Contact: Good  Speech: Clear and Coherent; Normal Rate  Speech Volume: Normal  Handedness: Right   Mood and Affect  Mood: Depressed; Anxious; Angry; Hopeless; Worthless  Affect: Congruent; Depressed; Tearful   Thought Process  Thought Processes: Coherent; Goal Directed; Linear  Descriptions of Associations:Intact  Orientation:Full (Time, Place and Person)  Thought Content:Logical  History of Schizophrenia/Schizoaffective disorder:No data recorded Duration of Psychotic  Symptoms:No data recorded Hallucinations:No data recorded Ideas of Reference:None  Suicidal Thoughts:No data recorded Homicidal Thoughts:No data recorded  Sensorium  Memory: Immediate Good; Recent Good; Remote Good  Judgment: Intact  Insight: Present   Executive Functions  Concentration: Good  Attention Span: Good  Recall: Good  Fund of Knowledge: Good  Language: Good   Psychomotor Activity  Psychomotor Activity:No data  recorded  Assets  Assets: Communication Skills; Desire for Improvement; Physical Health   Sleep  Sleep:No data recorded   Physical Exam: Physical Exam Vitals and nursing note reviewed.  Constitutional:      Appearance: Normal appearance. She is normal weight.  Neurological:     General: No focal deficit present.     Mental Status: She is alert and oriented to person, place, and time.  Psychiatric:        Mood and Affect: Mood normal.        Behavior: Behavior normal.    Review of Systems  Constitutional: Negative.   HENT: Negative.    Eyes: Negative.   Respiratory: Negative.    Cardiovascular: Negative.   Gastrointestinal: Negative.   Genitourinary: Negative.   Musculoskeletal: Negative.   Skin: Negative.   Neurological: Negative.   Endo/Heme/Allergies: Negative.   Psychiatric/Behavioral: Negative.     Blood pressure 138/83, pulse 87, temperature 97.7 F (36.5 C), temperature source Oral, resp. rate 18, height '5\' 6"'$  (1.676 m), weight 59.4 kg, SpO2 95 %. Body mass index is 21.14 kg/m.   Treatment Plan Summary: Daily contact with patient to assess and evaluate symptoms and progress in treatment, Medication management, and Plan 1 dose of Valium and then discontinue.  Parks Ranger, DO 09/20/2022, 11:49 AM

## 2022-09-20 NOTE — Progress Notes (Signed)
D: Pt alert and oriented. Pt denies experiencing any anxiety/depression at this time. Pt denies experiencing any pain at this time. Pt denies experiencing any SI/HI, or AVH at this time.     A: Scheduled medications administered to pt, per MD orders. Support and encouragement provided. Routine safety checks conducted q15 minutes.   R: No adverse drug reactions noted. Pt verbally contracts for safety at this time. Pt complaint with medications. Pt interacts minimally with others on the unit. Pt remains safe at this time. Will continue plan of care.

## 2022-09-20 NOTE — Progress Notes (Signed)
Patient stayed in the dayroom watching TV. Calm and cooperative. Alert and oriented x 4. Denying SI/HI/AVH and reported that she is feeling much better "I think I am ready to go home". Patient was pleasant. Had a snack and received HS medications. Currently in bed sleeping. No sign of distress. Safety precautions maintained.

## 2022-09-21 DIAGNOSIS — F332 Major depressive disorder, recurrent severe without psychotic features: Secondary | ICD-10-CM | POA: Diagnosis not present

## 2022-09-21 NOTE — BHH Counselor (Signed)
CSW met with the patient to discuss aftercare plans.  Patient declined CDIOP referral due to financial constraints with transportation to and from the appointments.  CDIOP appointments are three times a week and patient reports that she is unable to afford this.  She requested to have walk in information for [facility] and [facility] instead of a scheduled appointment.  She reports plans to follow up with AA.    She declined referral to [facility] as primary CSW recommended, due to the self-pay rate of $325.  Assunta Curtis, MSW, LCSW 09/21/2022 2:13 PM

## 2022-09-21 NOTE — Progress Notes (Signed)
Patient slept throughout the night without any difficulties. Current in bed awake, pleasant and cooperative. Staff continue to provide support and encouragements.

## 2022-09-21 NOTE — BH IP Treatment Plan (Signed)
Interdisciplinary Treatment and Diagnostic Plan Update  09/21/2022 Time of Session: 9:30AM Dorothy [name] MRN: [id number]  Principal Diagnosis: MDD (major depressive disorder), recurrent severe, without psychosis ([place])  Secondary Diagnoses: Principal Problem:   MDD (major depressive disorder), recurrent severe, without psychosis ([place])   Current Medications:  Current Facility-Administered Medications  Medication Dose Route Frequency Provider Last Rate Last Admin   acetaminophen (TYLENOL) tablet 650 mg  650 mg Oral Q6H PRN Parks Ranger, DO       alum & mag hydroxide-simeth (MAALOX/MYLANTA) 200-200-20 MG/5ML suspension 30 mL  30 mL Oral Q4H PRN Parks Ranger, DO       amLODipine (NORVASC) tablet 10 mg  10 mg Oral Daily Parks Ranger, DO   10 mg at 09/20/22 2353   aspirin EC tablet 81 mg  81 mg Oral Daily Parks Ranger, DO   81 mg at 09/20/22 0905   atorvastatin (LIPITOR) tablet 80 mg  80 mg Oral QHS Parks Ranger, DO   80 mg at 09/20/22 2150   clopidogrel (PLAVIX) tablet 75 mg  75 mg Oral Daily Parks Ranger, DO   75 mg at 09/20/22 6144   FLUoxetine (PROZAC) capsule 20 mg  20 mg Oral Daily Parks Ranger, DO   20 mg at 09/20/22 3154   magnesium hydroxide (MILK OF MAGNESIA) suspension 30 mL  30 mL Oral Daily PRN Parks Ranger, DO       menthol-cetylpyridinium (CEPACOL) lozenge 3 mg  1 lozenge Oral PRN Parks Ranger, DO       nicotine (NICODERM CQ - dosed in mg/24 hours) patch 21 mg  21 mg Transdermal Daily Parks Ranger, DO   21 mg at 09/20/22 0906   OLANZapine (ZYPREXA) tablet 10 mg  10 mg Oral Q6H PRN Parks Ranger, DO       risperiDONE (RISPERDAL) tablet 1 mg  1 mg Oral QHS Parks Ranger, DO   1 mg at 09/20/22 2150   traZODone (DESYREL) tablet 150 mg  150 mg Oral QHS Parks Ranger, DO   150 mg at 09/20/22 2150   PTA Medications: Medications Prior to  Admission  Medication Sig Dispense Refill Last Dose   acetaminophen (TYLENOL) 500 MG tablet Take 500 mg by mouth every 6 (six) hours as needed for mild pain or moderate pain. (Patient not taking: Reported on 11/14/2021)      amLODipine (NORVASC) 5 MG tablet Take 1 tablet (5 mg total) by mouth daily. 30 tablet 3    aspirin EC 81 MG EC tablet Take 1 tablet (81 mg total) by mouth daily. Swallow whole. (Patient not taking: Reported on 11/14/2021) 30 tablet 11    atorvastatin (LIPITOR) 80 MG tablet Take 1 tablet (80 mg total) by mouth daily. 90 tablet 1    clopidogrel (PLAVIX) 75 MG tablet Take 75 mg by mouth daily.      FLUoxetine (PROZAC) 20 MG tablet Take 1 tablet (20 mg total) by mouth daily. 30 tablet 3    traZODone (DESYREL) 150 MG tablet Take 0.5 tablets (75 mg total) by mouth at bedtime. 30 tablet 1     Patient Stressors:    Patient Strengths:    Treatment Modalities: Medication Management, Group therapy, Case management,  1 to 1 session with clinician, Psychoeducation, Recreational therapy.   Physician Treatment Plan for Primary Diagnosis: MDD (major depressive disorder), recurrent severe, without psychosis ([place]) Long Term Goal(s): Improvement in symptoms so as ready for discharge  Short Term Goals: Ability to identify changes in lifestyle to reduce recurrence of condition will improve Ability to verbalize feelings will improve Ability to disclose and discuss suicidal ideas Ability to demonstrate self-control will improve Ability to identify and develop effective coping behaviors will improve Ability to maintain clinical measurements within normal limits will improve Compliance with prescribed medications will improve Ability to identify triggers associated with substance abuse/mental health issues will improve  Medication Management: Evaluate patient's response, side effects, and tolerance of medication regimen.  Therapeutic Interventions: 1 to 1 sessions, Unit Group sessions and  Medication administration.  Evaluation of Outcomes: Progressing  Physician Treatment Plan for Secondary Diagnosis: Principal Problem:   MDD (major depressive disorder), recurrent severe, without psychosis ([place])  Long Term Goal(s): Improvement in symptoms so as ready for discharge   Short Term Goals: Ability to identify changes in lifestyle to reduce recurrence of condition will improve Ability to verbalize feelings will improve Ability to disclose and discuss suicidal ideas Ability to demonstrate self-control will improve Ability to identify and develop effective coping behaviors will improve Ability to maintain clinical measurements within normal limits will improve Compliance with prescribed medications will improve Ability to identify triggers associated with substance abuse/mental health issues will improve     Medication Management: Evaluate patient's response, side effects, and tolerance of medication regimen.  Therapeutic Interventions: 1 to 1 sessions, Unit Group sessions and Medication administration.  Evaluation of Outcomes: Progressing   RN Treatment Plan for Primary Diagnosis: MDD (major depressive disorder), recurrent severe, without psychosis ([place]) Long Term Goal(s): Knowledge of disease and therapeutic regimen to maintain health will improve  Short Term Goals: Ability to demonstrate self-control, Ability to participate in decision making will improve, Ability to verbalize feelings will improve, Ability to disclose and discuss suicidal ideas, Ability to identify and develop effective coping behaviors will improve, and Compliance with prescribed medications will improve  Medication Management: RN will administer medications as ordered by provider, will assess and evaluate patient's response and provide education to patient for prescribed medication. RN will report any adverse and/or side effects to prescribing provider.  Therapeutic Interventions: 1 on 1 counseling  sessions, Psychoeducation, Medication administration, Evaluate responses to treatment, Monitor vital signs and CBGs as ordered, Perform/monitor CIWA, COWS, AIMS and Fall Risk screenings as ordered, Perform wound care treatments as ordered.  Evaluation of Outcomes: Progressing   LCSW Treatment Plan for Primary Diagnosis: MDD (major depressive disorder), recurrent severe, without psychosis ([place]) Long Term Goal(s): Safe transition to appropriate next level of care at discharge, Engage patient in therapeutic group addressing interpersonal concerns.  Short Term Goals: Engage patient in aftercare planning with referrals and resources, Increase social support, Increase ability to appropriately verbalize feelings, Increase emotional regulation, Facilitate acceptance of mental health diagnosis and concerns, and Increase skills for wellness and recovery  Therapeutic Interventions: Assess for all discharge needs, 1 to 1 time with Social worker, Explore available resources and support systems, Assess for adequacy in community support network, Educate family and significant other(s) on suicide prevention, Complete Psychosocial Assessment, Interpersonal group therapy.  Evaluation of Outcomes: Progressing   Progress in Treatment: Attending groups: Yes. Participating in groups: Yes. Taking medication as prescribed: Yes. Toleration medication: Yes. Family/Significant other contact made: No, will contact:  once permission is given. Patient understands diagnosis: Yes. Discussing patient identified problems/goals with staff: Yes. Medical problems stabilized or resolved: Yes. Denies suicidal/homicidal ideation: Yes. Issues/concerns per patient self-inventory: No. Other: none  New problem(s) identified: No, Describe:  None  Update 09/21/2022:  No changes at this time.    New Short Term/Long Term Goal(s): Patient to work towards detox, medication management for mood stabilization; elimination of SI thoughts;  development of comprehensive mental wellness/sobriety plan. Update 09/21/2022:  No changes at this time.    Patient Goals:  "detox" Update 09/21/2022:  No changes at this time.    Discharge Plan or Barriers: CSW will assist pt with development of appropriate discharge/aftercare plan. Update 09/21/2022:  Patient reports plans to return to her home.  She is seeking SU treatment and CSW to assist.     Reason for Continuation of Hospitalization: Anxiety Depression Medication stabilization Withdrawal symptoms   Estimated Length of Stay: 1-7 days Update 09/21/2022:  TBD  Last 3 [place] Suicide Severity Risk Score: Flowsheet Row Admission (Current) from 09/15/2022 in [place] Most recent reading at 09/19/2022  7:32 PM ED from 09[facility]15/2022 in [facility] Most recent reading at 09/15/2022  3:24 AM ED from 01/09/2022 in [facility]) Most recent reading at 01/09/2022  1:24 PM  C-SSRS RISK CATEGORY No Risk High Risk No Risk       Last PHQ 2/9 Scores:    12/15/2021    1:40 PM 11/14/2021   10:52 AM  Depression screen PHQ 2/9  Decreased Interest 0 0  Down, Depressed, Hopeless 2 1  PHQ - 2 Score 2 1  Altered sleeping 1 1  Tired, decreased energy 1 1  Change in appetite 0 0  Feeling bad or failure about yourself  1 1  Trouble concentrating 0 0  Moving slowly or fidgety/restless 0 0  Suicidal thoughts 0 0  PHQ-9 Score 5 4  Difficult doing work/chores Somewhat difficult     Scribe for Treatment Team: Rozann Lesches, LCSW 09/21/2022 9:44 AM

## 2022-09-21 NOTE — Plan of Care (Signed)
Pt denies anxiety/depression at this time. Pt denies SI/HI/AVH or pain at this time. Pt is calm and cooperative. Pt is medication compliant. Pt provided with support and encouragement. Pt monitored q15 minutes for safety per unit policy. Plan of care ongoing.   Problem: Education: Goal: Knowledge of General Education information will improve Description: Including pain rating scale, medication(s)/side effects and non-pharmacologic comfort measures Outcome: Progressing   Problem: Nutrition: Goal: Adequate nutrition will be maintained Outcome: Progressing

## 2022-09-21 NOTE — Group Note (Signed)
LCSW Group Therapy Note  Group Date: 09/21/2022 Start Time: 8003 End Time: 1350   Type of Therapy and Topic:  Group Therapy - How To Cope with Nervousness about Discharge   Participation Level:  Did Not Attend   Description of Group This process group involved identification of patients' feelings about discharge. Some of them are scheduled to be discharged soon, while others are new admissions, but each of them was asked to share thoughts and feelings surrounding discharge from the hospital. One common theme was that they are excited at the prospect of going home, while another was that many of them are apprehensive about sharing why they were hospitalized. Patients were given the opportunity to discuss these feelings with their peers in preparation for discharge.  Therapeutic Goals  Patient will identify their overall feelings about pending discharge. Patient will think about how they might proactively address issues that they believe will once again arise once they get home (i.e. with parents). Patients will participate in discussion about having hope for change.   Summary of Patient Progress:   X   Therapeutic Modalities Cognitive Behavioral Therapy   Rozann Lesches, LCSWA 09/21/2022  1:50 PM

## 2022-09-21 NOTE — Progress Notes (Signed)
[facility] [place] MD Progress Note  09/21/2022 10:15 AM Dorothy [name]  MRN:  [id number] Subjective: Dorothy [name] is seen on rounds.  She is feeling a lot better.  She is being compliant with her medications.  No withdrawal symptoms.  She plans on doing virtual outpatient group therapy.  Social worker is arranging that.  She also needs transportation home.  She denies any suicidal ideation.  She states her mood is better.  She is on Prozac, Risperdal, and trazodone.  She also has blood pressure medicine Principal Problem: MDD (major depressive disorder), recurrent severe, without psychosis ([place]) Diagnosis: Principal Problem:   MDD (major depressive disorder), recurrent severe, without psychosis ([place])  Total Time spent with patient: 15 minutes  Past Psychiatric History: Depression and alcohol dependence  Past Medical History:  Past Medical History:  Diagnosis Date   Alcoholic ([place])    Chronic bronchitis ([place])    Still smoking   Depression    GERD (gastroesophageal reflux disease)    Hoarseness 06/2010   Oral thrush---- Dr. Melida Quitter   Hypertension    Insomnia    Malignant melanoma ([place]) 2009   Removed from left shoulder    Past Surgical History:  Procedure Laterality Date   BASAL CELL CARCINOMA EXCISION  2012   Right side nose    BREAST ENHANCEMENT SURGERY  2002   ELBOW SURGERY     Right elbow surgery---Ruptured tendon   Family History:  Family History  Problem Relation Age of Onset   Cirrhosis Father    Family Psychiatric  History: Unremarkable Social History:  Social History   Substance and Sexual Activity  Alcohol Use Yes   Comment: >1 pint vodka daily and wine     Social History   Substance and Sexual Activity  Drug Use Yes   Types: Marijuana    Social History   Socioeconomic History   Marital status: Divorced    Spouse name: Not on file   Number of children: Not on file   Years of education: Not on file   Highest education level: Not on file  Occupational History    Not on file  Tobacco Use   Smoking status: Every Day    Packs/day: 1.50    Types: Cigarettes    Start date: 08/24/1972    Passive exposure: Current   Smokeless tobacco: Never  Vaping Use   Vaping Use: Never used  Substance and Sexual Activity   Alcohol use: Yes    Comment: >1 pint vodka daily and wine   Drug use: Yes    Types: Marijuana   Sexual activity: Not on file  Other Topics Concern   Not on file  Social History Narrative   Not on file   Social Determinants of Health   Financial Resource Strain: Not on file  Food Insecurity: No Food Insecurity (09/16/2022)   Hunger Vital Sign    Worried About Running Out of Food in the Last Year: Never true    Ran Out of Food in the Last Year: Never true  Transportation Needs: No Transportation Needs (09/16/2022)   PRAPARE - Hydrologist (Medical): No    Lack of Transportation (Non-Medical): No  Physical Activity: Not on file  Stress: Not on file  Social Connections: Not on file   Additional Social History:                         Sleep: Good  Appetite:  Good  Current Medications: Current Facility-Administered Medications  Medication Dose Route Frequency Provider Last Rate Last Admin   acetaminophen (TYLENOL) tablet 650 mg  650 mg Oral Q6H PRN Parks Ranger, DO       alum & mag hydroxide-simeth (MAALOX/MYLANTA) 200-200-20 MG/5ML suspension 30 mL  30 mL Oral Q4H PRN Parks Ranger, DO       amLODipine (NORVASC) tablet 10 mg  10 mg Oral Daily Parks Ranger, DO   10 mg at 09/21/22 0998   aspirin EC tablet 81 mg  81 mg Oral Daily Parks Ranger, DO   81 mg at 09/21/22 0948   atorvastatin (LIPITOR) tablet 80 mg  80 mg Oral QHS Parks Ranger, DO   80 mg at 09/20/22 2150   clopidogrel (PLAVIX) tablet 75 mg  75 mg Oral Daily Parks Ranger, DO   75 mg at 09/21/22 3382   FLUoxetine (PROZAC) capsule 20 mg  20 mg Oral Daily Parks Ranger, DO   20 mg at 09/21/22 5053   magnesium hydroxide (MILK OF MAGNESIA) suspension 30 mL  30 mL Oral Daily PRN Parks Ranger, DO       menthol-cetylpyridinium (CEPACOL) lozenge 3 mg  1 lozenge Oral PRN Parks Ranger, DO       nicotine (NICODERM CQ - dosed in mg/24 hours) patch 21 mg  21 mg Transdermal Daily Parks Ranger, DO   21 mg at 09/21/22 0950   OLANZapine (ZYPREXA) tablet 10 mg  10 mg Oral Q6H PRN Parks Ranger, DO       risperiDONE (RISPERDAL) tablet 1 mg  1 mg Oral QHS Parks Ranger, DO   1 mg at 09/20/22 2150   traZODone (DESYREL) tablet 150 mg  150 mg Oral QHS Parks Ranger, DO   150 mg at 09/20/22 2150    Lab Results: No results found for this or any previous visit (from the past 48 hour(s)).  Blood Alcohol level:  Lab Results  Component Value Date   ETH [facility]) 09/15/2022   ETH <10 97/67/3419    Metabolic Disorder Labs: Lab Results  Component Value Date   HGBA1C 5.5 10/16/2021   MPG 111 10/16/2021   No results found for: "PROLACTIN" Lab Results  Component Value Date   CHOL 217 (H) 10/16/2021   TRIG 96 10/16/2021   HDL 43 10/16/2021   CHOLHDL 5.0 10/16/2021   VLDL 19 10/16/2021   LDLCALC 155 (H) 10/16/2021    Physical Findings: AIMS:  , ,  ,  ,    CIWA:  CIWA-Ar Total: 0 COWS:     Musculoskeletal: Strength & Muscle Tone: within normal limits Gait & Station: normal Patient leans: N/A  Psychiatric Specialty Exam:  Presentation  General Appearance:  Casual; Disheveled; Other (comment) (Emaciated, thin.)  Eye Contact: Good  Speech: Clear and Coherent; Normal Rate  Speech Volume: Normal  Handedness: Right   Mood and Affect  Mood: Depressed; Anxious; Angry; Hopeless; Worthless  Affect: Congruent; Depressed; Tearful   Thought Process  Thought Processes: Coherent; Goal Directed; Linear  Descriptions of Associations:Intact  Orientation:Full (Time, Place and  Person)  Thought Content:Logical  History of Schizophrenia/Schizoaffective disorder:No data recorded Duration of Psychotic Symptoms:No data recorded Hallucinations:No data recorded Ideas of Reference:None  Suicidal Thoughts:No data recorded Homicidal Thoughts:No data recorded  Sensorium  Memory: Immediate Good; Recent Good; Remote Good  Judgment: Intact  Insight: Present   Executive Functions  Concentration: Good  Attention Span: Good  Recall:  Good  Fund of Knowledge: Good  Language: Good   Psychomotor Activity  Psychomotor Activity:No data recorded  Assets  Assets: Communication Skills; Desire for Improvement; Physical Health   Sleep  Sleep:No data recorded   Physical Exam: Physical Exam Vitals and nursing note reviewed.  Constitutional:      Appearance: Normal appearance. She is normal weight.  Neurological:     General: No focal deficit present.     Mental Status: She is alert and oriented to person, place, and time.  Psychiatric:        Attention and Perception: Attention and perception normal.        Mood and Affect: Mood and affect normal.        Speech: Speech normal.        Behavior: Behavior normal. Behavior is cooperative.        Thought Content: Thought content normal.        Cognition and Memory: Cognition and memory normal.        Judgment: Judgment normal.    Review of Systems  Constitutional: Negative.   HENT: Negative.    Eyes: Negative.   Respiratory: Negative.    Cardiovascular: Negative.   Gastrointestinal: Negative.   Genitourinary: Negative.   Musculoskeletal: Negative.   Skin: Negative.   Neurological: Negative.   Endo/Heme/Allergies: Negative.   Psychiatric/Behavioral:  Positive for depression.    Blood pressure 133/89, pulse 82, temperature (!) 97.5 F (36.4 C), resp. rate 17, height '5\' 6"'$  (1.676 m), weight 59.4 kg, SpO2 96 %. Body mass index is 21.14 kg/m.   Treatment Plan Summary: Daily contact with  patient to assess and evaluate symptoms and progress in treatment, Medication management, and Plan continue current medications.  [place], DO 09/21/2022, 10:15 AM

## 2022-09-22 DIAGNOSIS — F332 Major depressive disorder, recurrent severe without psychotic features: Secondary | ICD-10-CM | POA: Diagnosis not present

## 2022-09-22 MED ORDER — TRAZODONE HCL 150 MG PO TABS: 150.0000 mg | ORAL_TABLET | Freq: Every day | ORAL | 3 refills | Status: DC

## 2022-09-22 MED ORDER — ATORVASTATIN CALCIUM 80 MG PO TABS: 80.0000 mg | ORAL_TABLET | Freq: Every day | ORAL | 3 refills | Status: DC

## 2022-09-22 MED ORDER — RISPERIDONE 1 MG PO TABS: 1.0000 mg | ORAL_TABLET | Freq: Every day | ORAL | 3 refills | Status: AC

## 2022-09-22 MED ORDER — ASPIRIN 81 MG PO TBEC: 81.0000 mg | DELAYED_RELEASE_TABLET | Freq: Every day | ORAL | 12 refills | Status: AC

## 2022-09-22 MED ORDER — AMLODIPINE BESYLATE 10 MG PO TABS: 10.0000 mg | ORAL_TABLET | Freq: Every day | ORAL | 3 refills | Status: DC

## 2022-09-22 MED ORDER — FLUOXETINE HCL 20 MG PO CAPS: 20.0000 mg | ORAL_CAPSULE | Freq: Every day | ORAL | 3 refills | Status: DC

## 2022-09-22 NOTE — Progress Notes (Signed)
Discharge Note:  Patient denies SI/HI/AVH at this time. Discharge instructions, AVS/transition record, prescriptions, and  gone over with patient. Patient agrees to comply with medication management, follow-up visit, and outpatient therapy. Patient belongings returned to patient. Patient questions and concerns addressed and answered. Patient ambulatory off unit. Patient discharged to home via taxi.

## 2022-09-22 NOTE — Plan of Care (Signed)
Problem: Education: Goal: Knowledge of General Education information will improve Description: Including pain rating scale, medication(s)/side effects and non-pharmacologic comfort measures 09/22/2022 1408 by Luis Abed, RN Outcome: Adequate for Discharge 09/22/2022 1408 by Luis Abed, RN Outcome: Adequate for Discharge 09/22/2022 1408 by Luis Abed, RN Outcome: Progressing   Problem: Health Behavior/Discharge Planning: Goal: Ability to manage health-related needs will improve 09/22/2022 1408 by Luis Abed, RN Outcome: Adequate for Discharge 09/22/2022 1408 by Luis Abed, RN Outcome: Adequate for Discharge 09/22/2022 1408 by Luis Abed, RN Outcome: Progressing   Problem: Clinical Measurements: Goal: Ability to maintain clinical measurements within normal limits will improve 09/22/2022 1408 by Luis Abed, RN Outcome: Adequate for Discharge 09/22/2022 1408 by Luis Abed, RN Outcome: Adequate for Discharge 09/22/2022 1408 by Luis Abed, RN Outcome: Progressing Goal: Will remain free from infection 09/22/2022 1408 by Luis Abed, RN Outcome: Adequate for Discharge 09/22/2022 1408 by Luis Abed, RN Outcome: Adequate for Discharge 09/22/2022 1408 by Luis Abed, RN Outcome: Progressing Goal: Diagnostic test results will improve 09/22/2022 1408 by Luis Abed, RN Outcome: Adequate for Discharge 09/22/2022 1408 by Luis Abed, RN Outcome: Adequate for Discharge 09/22/2022 1408 by Luis Abed, RN Outcome: Progressing Goal: Respiratory complications will improve 09/22/2022 1408 by Luis Abed, RN Outcome: Adequate for Discharge 09/22/2022 1408 by Luis Abed, RN Outcome: Adequate for Discharge 09/22/2022 1408 by Luis Abed, RN Outcome: Progressing Goal: Cardiovascular complication will be avoided 09/22/2022 1408 by Luis Abed, RN Outcome: Adequate for Discharge 09/22/2022 1408 by Luis Abed, RN Outcome: Adequate for Discharge 09/22/2022 1408 by Luis Abed, RN Outcome: Progressing   Problem: Activity: Goal: Risk for activity intolerance will decrease 09/22/2022 1408 by Luis Abed, RN Outcome: Adequate for Discharge 09/22/2022 1408 by Luis Abed, RN Outcome: Adequate for Discharge 09/22/2022 1408 by Luis Abed, RN Outcome: Progressing   Problem: Nutrition: Goal: Adequate nutrition will be maintained 09/22/2022 1408 by Luis Abed, RN Outcome: Adequate for Discharge 09/22/2022 1408 by Luis Abed, RN Outcome: Adequate for Discharge 09/22/2022 1408 by Luis Abed, RN Outcome: Progressing   Problem: Coping: Goal: Level of anxiety will decrease 09/22/2022 1408 by Luis Abed, RN Outcome: Adequate for Discharge 09/22/2022 1408 by Luis Abed, RN Outcome: Adequate for Discharge 09/22/2022 1408 by Luis Abed, RN Outcome: Progressing   Problem: Elimination: Goal: Will not experience complications related to bowel motility 09/22/2022 1408 by Luis Abed, RN Outcome: Adequate for Discharge 09/22/2022 1408 by Luis Abed, RN Outcome: Adequate for Discharge 09/22/2022 1408 by Luis Abed, RN Outcome: Progressing Goal: Will not experience complications related to urinary retention 09/22/2022 1408 by Luis Abed, RN Outcome: Adequate for Discharge 09/22/2022 1408 by Luis Abed, RN Outcome: Adequate for Discharge 09/22/2022 1408 by Luis Abed, RN Outcome: Progressing   Problem: Pain Managment: Goal: General experience of comfort will improve 09/22/2022 1408 by Luis Abed, RN Outcome: Adequate for Discharge 09/22/2022 1408 by Luis Abed, RN Outcome: Adequate for Discharge 09/22/2022 1408 by Luis Abed, RN Outcome: Progressing   Problem: Safety: Goal: Ability to remain free from injury will improve 09/22/2022 1408 by Luis Abed, RN Outcome: Adequate for  Discharge 09/22/2022 1408 by Luis Abed, RN Outcome: Adequate for Discharge 09/22/2022 1408 by Luis Abed, RN Outcome: Progressing   Problem: Skin Integrity: Goal: Risk for impaired skin integrity will  decrease 09/22/2022 1408 by Luis Abed, RN Outcome: Adequate for Discharge 09/22/2022 1408 by Luis Abed, RN Outcome: Adequate for Discharge 09/22/2022 1408 by Luis Abed, RN Outcome: Progressing   Problem: Education: Goal: Ability to make informed decisions regarding treatment will improve 09/22/2022 1408 by Luis Abed, RN Outcome: Adequate for Discharge 09/22/2022 1408 by Luis Abed, RN Outcome: Adequate for Discharge 09/22/2022 1408 by Luis Abed, RN Outcome: Progressing   Problem: Coping: Goal: Coping ability will improve 09/22/2022 1408 by Luis Abed, RN Outcome: Adequate for Discharge 09/22/2022 1408 by Luis Abed, RN Outcome: Adequate for Discharge 09/22/2022 1408 by Luis Abed, RN Outcome: Progressing   Problem: Health Behavior/Discharge Planning: Goal: Identification of resources available to assist in meeting health care needs will improve 09/22/2022 1408 by Luis Abed, RN Outcome: Adequate for Discharge 09/22/2022 1408 by Luis Abed, RN Outcome: Adequate for Discharge 09/22/2022 1408 by Luis Abed, RN Outcome: Progressing   Problem: Self-Concept: Goal: Ability to disclose and discuss suicidal ideas will improve 09/22/2022 1408 by Luis Abed, RN Outcome: Adequate for Discharge 09/22/2022 1408 by Luis Abed, RN Outcome: Adequate for Discharge 09/22/2022 1408 by Luis Abed, RN Outcome: Progressing Goal: Will verbalize positive feelings about self 09/22/2022 1408 by Luis Abed, RN Outcome: Adequate for Discharge 09/22/2022 1408 by Luis Abed, RN Outcome: Adequate for Discharge 09/22/2022 1408 by Luis Abed, RN Outcome: Progressing   Problem:  Education: Goal: Knowledge of disease or condition will improve 09/22/2022 1408 by Luis Abed, RN Outcome: Adequate for Discharge 09/22/2022 1408 by Luis Abed, RN Outcome: Adequate for Discharge 09/22/2022 1408 by Luis Abed, RN Outcome: Progressing Goal: Understanding of discharge needs will improve 09/22/2022 1408 by Luis Abed, RN Outcome: Adequate for Discharge 09/22/2022 1408 by Luis Abed, RN Outcome: Adequate for Discharge 09/22/2022 1408 by Luis Abed, RN Outcome: Progressing   Problem: Health Behavior/Discharge Planning: Goal: Ability to identify changes in lifestyle to reduce recurrence of condition will improve 09/22/2022 1408 by Luis Abed, RN Outcome: Adequate for Discharge 09/22/2022 1408 by Luis Abed, RN Outcome: Adequate for Discharge 09/22/2022 1408 by Luis Abed, RN Outcome: Progressing Goal: Identification of resources available to assist in meeting health care needs will improve 09/22/2022 1408 by Luis Abed, RN Outcome: Adequate for Discharge 09/22/2022 1408 by Luis Abed, RN Outcome: Adequate for Discharge 09/22/2022 1408 by Luis Abed, RN Outcome: Progressing   Problem: Physical Regulation: Goal: Complications related to the disease process, condition or treatment will be avoided or minimized 09/22/2022 1408 by Luis Abed, RN Outcome: Adequate for Discharge 09/22/2022 1408 by Luis Abed, RN Outcome: Adequate for Discharge 09/22/2022 1408 by Luis Abed, RN Outcome: Progressing

## 2022-09-22 NOTE — Progress Notes (Signed)
Patient denies SI, HI, and AVH. She is calm and cooperative with assessment. Patient interacts appropriately with staff and other patients. She is visible in the milieu. Patient is compliant with scheduled medications. She states she is ready for discharge. Patient returned suicide prevention plan, placed in chart. Patient remains safe on the unit at this time.

## 2022-09-22 NOTE — Discharge Summary (Signed)
Physician Discharge Summary Note  Patient:  Dorothy [name] is an 67 y.o., [name] MRN:  [id number] DOB:  1955-11-16 Patient phone:  [phone] (home)  Patient address:   [place] [address],  Total Time spent with patient: 1 hour  Date of Admission:  09/15/2022 Date of Discharge: 09/22/2022  Reason for Admission: Dorothy [name] is Dorothy 67 y.o. [name] patient admitted with  significant hx of Alcohol dependence, anxiety disorder, Nicotine use disorder and Major Depressive disorder brought in by [facility]  with report of feeling suicidal in the context of her inability to stop drinking alcohol.  On arrival to the ER alcohol level was 313.   Pt arrives with [employer] with reports of suicidal ideation after she called for herself. Pt arrives and reports she has been feeling suicidal and does not want to live any more. Pt reports she has been unable to get Dorothy job and does not have anything to look forward to. Pt repetitively talking about not having anything to do. Pt reports daily ETOH.    She reports and rated Depression 10/10 with 10 being severe depression.  She reports feeling worthless, helpless and guilty of falling back on Alcohol after two long term Alcohol rehab treatment.  She also cannot drive due to two DUI that led to her  losing her License. Patient reports started drinking at age 38 and smokes two packs of Cigarettes Dorothy day.She is estranged from her children due to alcohol use.  She reports that her depression is as Dorothy result of her drinking. She lives alone with her cat and is unemployed. Her plan is to try another long term alcohol rehab treatment. Principal Problem: MDD (major depressive disorder), recurrent severe, without psychosis ([place]) Discharge Diagnoses: Principal Problem:   MDD (major depressive disorder), recurrent severe, without psychosis ([place])   Past Psychiatric History:  Hx of Alcohol dependence, anxiety disorder, Nicotine use disorder and  Major Depressive disorde. No inpatient Psychiatric hospitalization except two rehab treatment. No hx of suicide attempts.      Past Medical History:  Past Medical History:  Diagnosis Date   Alcoholic ([place])    Chronic bronchitis ([place])    Still smoking   Depression    GERD (gastroesophageal reflux disease)    Hoarseness 06/2010   Oral thrush---- Dr. Melida Quitter   Hypertension    Insomnia    Malignant melanoma ([place]) 2009   Removed from left shoulder    Past Surgical History:  Procedure Laterality Date   BASAL CELL CARCINOMA EXCISION  2012   Right side nose    BREAST ENHANCEMENT SURGERY  2002   ELBOW SURGERY     Right elbow surgery---Ruptured tendon   Family History:  Family History  Problem Relation Age of Onset   Cirrhosis Father    Family Psychiatric  History: Unremarkable Social History:  Social History   Substance and Sexual Activity  Alcohol Use Yes   Comment: >1 pint vodka daily and wine     Social History   Substance and Sexual Activity  Drug Use Yes   Types: Marijuana    Social History   Socioeconomic History   Marital status: Divorced    Spouse name: Not on file   Number of children: Not on file   Years of education: Not on file   Highest education level: Not on file  Occupational History   Not on file  Tobacco Use   Smoking status: Every Day    Packs/day:  1.50    Types: Cigarettes    Start date: 08/24/1972    Passive exposure: Current   Smokeless tobacco: Never  Vaping Use   Vaping Use: Never used  Substance and Sexual Activity   Alcohol use: Yes    Comment: >1 pint vodka daily and wine   Drug use: Yes    Types: Marijuana   Sexual activity: Not on file  Other Topics Concern   Not on file  Social History Narrative   Not on file   Social Determinants of Health   Financial Resource Strain: Not on file  Food Insecurity: No Food Insecurity (09/16/2022)   Hunger Vital Sign    Worried About Running Out of Food in the Last Year: Never true     Ran Out of Food in the Last Year: Never true  Transportation Needs: No Transportation Needs (09/16/2022)   PRAPARE - Hydrologist (Medical): No    Lack of Transportation (Non-Medical): No  Physical Activity: Not on file  Stress: Not on file  Social Connections: Not on file    Hospital Course:  Dorothy [name] is Dorothy [name] who was involuntarily admitted to geriatric psychiatry for alcohol dependence and depression.  She was having suicidal ideation in the emergency room.  She was admitted and started on detox protocol with Ativan.  Valium was started 5 mg twice Dorothy day which was tapered off.  She did not require any Ativan during her stay.  Her vital signs remained stable.  Her mood and affect improved.  She was put on Prozac, Risperdal, and trazodone.  She tolerated the medications well.  She denied any side effects.  She felt like they were helpful.  Her sleep and appetite improved.  She denied suicidal ideation and never had any past suicide attempts.  She did not want to go back to rehab for the third time but wanted to do outpatient.  She wanted to follow-up with AA virtually and she was given some organizations to contact by social work.  She interacted well with staff and peers.  She participated in groups.  It is felt that she maximized hospitalization she was discharged home.  On the day of discharge she denied suicidal ideation, homicidal ideation, auditory or visual hallucinations.  Her judgment and insight were good.  Physical Findings: AIMS:  , ,  ,  ,    CIWA:  CIWA-Ar Total: 0 COWS:     Musculoskeletal: Strength & Muscle Tone: within normal limits Gait & Station: normal Patient leans: N/Dorothy   Psychiatric Specialty Exam:  Presentation  General Appearance:  Casual; Disheveled; Other (comment) (Emaciated, thin.)  Eye Contact: Good  Speech: Clear and Coherent; Normal Rate  Speech Volume: Normal  Handedness: Right   Mood and  Affect  Mood: Depressed; Anxious; Angry; Hopeless; Worthless  Affect: Congruent; Depressed; Tearful   Thought Process  Thought Processes: Coherent; Goal Directed; Linear  Descriptions of Associations:Intact  Orientation:Full (Time, Place and Person)  Thought Content:Logical  History of Schizophrenia/Schizoaffective disorder:No data recorded Duration of Psychotic Symptoms:No data recorded Hallucinations:No data recorded Ideas of Reference:None  Suicidal Thoughts:No data recorded Homicidal Thoughts:No data recorded  Sensorium  Memory: Immediate Good; Recent Good; Remote Good  Judgment: Intact  Insight: Present   Executive Functions  Concentration: Good  Attention Span: Good  Recall: Good  Fund of Knowledge: Good  Language: Good   Psychomotor Activity  Psychomotor Activity:No data recorded  Assets  Assets: Communication Skills; Desire for Improvement; Physical  Health   Sleep  Sleep:No data recorded   Physical Exam: Physical Exam Vitals and nursing note reviewed.  Constitutional:      Appearance: Normal appearance. She is normal weight.  Neurological:     General: No focal deficit present.     Mental Status: She is alert and oriented to person, place, and time.  Psychiatric:        Attention and Perception: Attention and perception normal.        Mood and Affect: Mood and affect normal.        Speech: Speech normal.        Behavior: Behavior normal. Behavior is cooperative.        Thought Content: Thought content normal.        Cognition and Memory: Cognition and memory normal.        Judgment: Judgment normal.    Review of Systems  Constitutional: Negative.   HENT: Negative.    Eyes: Negative.   Respiratory: Negative.    Cardiovascular: Negative.   Gastrointestinal: Negative.   Genitourinary: Negative.   Musculoskeletal: Negative.   Skin: Negative.   Neurological: Negative.   Endo/Heme/Allergies: Negative.    Psychiatric/Behavioral: Negative.     Blood pressure 138/83, pulse 72, temperature 97.7 F (36.5 C), temperature source Oral, resp. rate 17, height '5\' 6"'$  (1.676 m), weight 59.4 kg, SpO2 98 %. Body mass index is 21.14 kg/m.   Social History   Tobacco Use  Smoking Status Every Day   Packs/day: 1.50   Types: Cigarettes   Start date: 08/24/1972   Passive exposure: Current  Smokeless Tobacco Never   Tobacco Cessation:  Prescription not provided because: She stated that she is going to continue to smoke.   Blood Alcohol level:  Lab Results  Component Value Date   ETH 313 [facility]) 09/15/2022   ETH <10 35/32/9924    Metabolic Disorder Labs:  Lab Results  Component Value Date   HGBA1C 5.5 10/16/2021   MPG 111 10/16/2021   No results found for: "PROLACTIN" Lab Results  Component Value Date   CHOL 217 (H) 10/16/2021   TRIG 96 10/16/2021   HDL 43 10/16/2021   CHOLHDL 5.0 10/16/2021   VLDL 19 10/16/2021   LDLCALC 155 (H) 10/16/2021    See Psychiatric Specialty Exam and Suicide Risk Assessment completed by Attending Physician prior to discharge.  Discharge destination:  Home  Is patient on multiple antipsychotic therapies at discharge:  No   Has Patient had three or more failed trials of antipsychotic monotherapy by history:  No  Recommended Plan for Multiple Antipsychotic Therapies: NA   Allergies as of 09/22/2022   No Known Allergies      Medication List     STOP taking these medications    acetaminophen 500 MG tablet Commonly known as: TYLENOL   clopidogrel 75 MG tablet Commonly known as: PLAVIX   FLUoxetine 20 MG tablet Commonly known as: PROZAC Replaced by: FLUoxetine 20 MG capsule       TAKE these medications      Indication  amLODipine 10 MG tablet Commonly known as: NORVASC Take 1 tablet (10 mg total) by mouth daily. Start taking on: September 23, 2022 What changed:  medication strength how much to take  Indication: High Blood Pressure  Disorder   aspirin EC 81 MG tablet Take 1 tablet (81 mg total) by mouth daily. Swallow whole. Start taking on: September 23, 2022    atorvastatin 80 MG tablet Commonly known as: LIPITOR Take  1 tablet (80 mg total) by mouth at bedtime. What changed: when to take this  Indication: High Amount of Fats in the Blood   FLUoxetine 20 MG capsule Commonly known as: PROZAC Take 1 capsule (20 mg total) by mouth daily. Start taking on: September 23, 2022 Replaces: FLUoxetine 20 MG tablet  Indication: Depression, Generalized Anxiety Disorder   risperiDONE 1 MG tablet Commonly known as: RISPERDAL Take 1 tablet (1 mg total) by mouth at bedtime.  Indication: Major Depressive Disorder   traZODone 150 MG tablet Commonly known as: DESYREL Take 1 tablet (150 mg total) by mouth at bedtime. What changed: how much to take  Indication: Abuse or Misuse of Alcohol, Trouble Sleeping, Major Depressive Disorder        Follow-up [place] Follow up.   Specialty: Urgent Care Why: Psychiatry Walk-Ins will be available on Monday, Wednesday, Thursday and Friday only from 8am-11am.  Please arrive by 7:30am Assessment/Therapy Walk-Ins will be available on Monday and Wednesday's 8am-11am. Please arrive by 7:30am. Contact information: [place] [address] [place] Follow up.   Specialty: Professional Counselor Why: walk in hours are from 8:30AM to 12 noon adn 1PM to 2:30PM.  Appointments are first come, first serve be sure to arrive early.  Thanks! Contact information: [facility] [place] [address] [phone]         Services, [facility] Follow up.   Why: Please call and see if virtual appointments are an option. Contact information: [address] [place] [address] [place] [address] [phone]                  Follow-up recommendations:  As above.    Signed: Parks Ranger, DO 09/22/2022, 11:07 AM

## 2022-09-22 NOTE — BHH Suicide Risk Assessment (Signed)
[facility] Discharge Suicide Risk Assessment   Principal Problem: MDD (major depressive disorder), recurrent severe, without psychosis ([place]) Discharge Diagnoses: Principal Problem:   MDD (major depressive disorder), recurrent severe, without psychosis ([place])   Total Time spent with patient: 1 hour  Musculoskeletal: Strength & Muscle Tone: within normal limits Gait & Station: normal Patient leans: N/A  Psychiatric Specialty Exam  Presentation  General Appearance:  Casual; Disheveled; Other (comment) (Emaciated, thin.)  Eye Contact: Good  Speech: Clear and Coherent; Normal Rate  Speech Volume: Normal  Handedness: Right   Mood and Affect  Mood: Depressed; Anxious; Angry; Hopeless; Worthless  Duration of Depression Symptoms: No data recorded Affect: Congruent; Depressed; Tearful   Thought Process  Thought Processes: Coherent; Goal Directed; Linear  Descriptions of Associations:Intact  Orientation:Full (Time, Place and Person)  Thought Content:Logical  History of Schizophrenia/Schizoaffective disorder:No data recorded Duration of Psychotic Symptoms:No data recorded Hallucinations:No data recorded Ideas of Reference:None  Suicidal Thoughts:No data recorded Homicidal Thoughts:No data recorded  Sensorium  Memory: Immediate Good; Recent Good; Remote Good  Judgment: Intact  Insight: Present   Executive Functions  Concentration: Good  Attention Span: Good  Recall: Good  Fund of Knowledge: Good  Language: Good   Psychomotor Activity  Psychomotor Activity:No data recorded  Assets  Assets: Communication Skills; Desire for Improvement; Physical Health   Sleep  Sleep:No data recorded  Physical Exam: Physical Exam ROS Blood pressure 138/83, pulse 72, temperature 97.7 F (36.5 C), temperature source Oral, resp. rate 17, height '5\' 6"'$  (1.676 m), weight 59.4 kg, SpO2 98 %. Body mass index is 21.14 kg/m.  Mental Status Per Nursing  Assessment::   On Admission:  Suicidal ideation indicated by patient, Belief that plan would result in death, Intention to act on suicide plan, Self-harm thoughts, Suicide plan  Demographic Factors:  Caucasian  Loss Factors: NA  Historical Factors: NA  Risk Reduction Factors:   Positive social support, Positive therapeutic relationship, and Positive coping skills or problem solving skills  Continued Clinical Symptoms:  Alcohol/Substance Abuse/Dependencies  Cognitive Features That Contribute To Risk:  None    Suicide Risk:  Minimal: No identifiable suicidal ideation.  Patients presenting with no risk factors but with morbid ruminations; may be classified as minimal risk based on the severity of the depressive symptoms   Follow-up [place] Follow up.   Specialty: Urgent Care Why: Psychiatry Walk-Ins will be available on Monday, Wednesday, Thursday and Friday only from 8am-11am.  Please arrive by 7:30am Assessment/Therapy Walk-Ins will be available on Monday and Wednesday's 8am-11am. Please arrive by 7:30am. Contact information: [place] [address] [place] Follow up.   Specialty: Professional Counselor Why: walk in hours are from 8:30AM to 12 noon adn 1PM to 2:30PM.  Appointments are first come, first serve be sure to arrive early.  Thanks! Contact information: [facility] [place] [address] [phone]         Services, [facility] Follow up.   Why: Please call and see if virtual appointments are an option. Contact information: [address] [address] [place] [address] [phone]                 Plan Of Care/Follow-up recommendations: As above   Parks Ranger, DO 09/22/2022, 10:50 AM

## 2022-09-22 NOTE — Progress Notes (Signed)
  [facility] Adult Case Management Discharge Plan :  Will you be returning to the same living situation after discharge:  Yes,  pt reports that she is returning home.  At discharge, do you have transportation home?: Yes,  CSW to assist with transportation needs.  Do you have the ability to pay for your medications: No.  Release of information consent forms completed and in the chart;  Patient's signature needed at discharge.  Patient to Follow up at:  [place] Follow up.   Specialty: Urgent Care Why: Psychiatry Walk-Ins will be available on Monday, Wednesday, Thursday and Friday only from 8am-11am.  Please arrive by 7:30am Assessment/Therapy Walk-Ins will be available on Monday and Wednesday's 8am-11am. Please arrive by 7:30am. Contact information: [place] [address] [place] Follow up.   Specialty: Professional Counselor Why: walk in hours are from 8:30AM to 12 noon adn 1PM to 2:30PM.  Appointments are first come, first serve be sure to arrive early.  Thanks! Contact information: [facility] [place] [address] [phone]         Services, [facility] Follow up.   Why: Please call and see if virtual appointments are an option. Contact information: [address] [address] [place] [address] [phone]                 Next level of care provider has access to [place] and Suicide Prevention discussed: Yes,  SPE completed with the patient.       Has patient been referred to the Quitline?: Patient refused referral  Patient has been referred for addiction treatment: Yes.  Pt declining referral for residential treatment.  Patient requested outpatient information.   Rozann Lesches, LCSW 09/22/2022, 9:57 AM

## 2023-06-24 ENCOUNTER — Ambulatory Visit: Payer: Self-pay

## 2023-06-24 NOTE — Telephone Encounter (Signed)
  Chief Complaint: Pain in thigh both inner and outer Symptoms: above Frequency: 2 months Pertinent Negatives: Patient denies Redness Disposition: [] ED /[x] Urgent Care (no appt availability in office) / [] Appointment(In office/virtual)/ []  [facility]/ [] Home Care/ [] Refused Recommended Disposition /[] [place] [facility]/ []  Follow-up with PCP Additional Notes: Pt called back. She has had thigh pain for the past 2 months. Pain has been mild/moderate. Pt does not report any swelling or redness. Brother has a hx of blood clots. No Appts in office, pt will go to UC for care.    Reason for Disposition  [1] Thigh or calf pain AND [2] only 1 side AND [3] present > 1 hour (Exception: Chronic unchanged pain.)  Answer Assessment - Initial Assessment Questions 1. ONSET: "When did the pain start?"      2 months 2. LOCATION: "Where is the pain located?"      Left leg - inner thigh and outside 3. PAIN: "How bad is the pain?"    (Scale 1-10; or mild, moderate, severe)   -  MILD (1-3): doesn't interfere with normal activities    -  MODERATE (4-7): interferes with normal activities (e.g., work or school) or awakens from sleep, limping    -  SEVERE (8-10): excruciating pain, unable to do any normal activities, unable to walk     3/10 - annoying 4. WORK OR EXERCISE: "Has there been any recent work or exercise that involved this part of the body?"      no 5. CAUSE: "What do you think is causing the leg pain?"     no 6. OTHER SYMPTOMS: "Do you have any other symptoms?" (e.g., chest pain, back pain, breathing difficulty, swelling, rash, fever, numbness, weakness)     no  Protocols used: Leg Pain-A-AH

## 2023-06-24 NOTE — Telephone Encounter (Signed)
Patient called, left VM to return the call to [facility] NT Line [phone] to speak to the NT.   Summary: pain on the left leg for two months.   Pt stated has been experiencing pain on the left leg for two months. Denied swelling.  No appointments available.  Seeking clinical advice.

## 2023-06-24 NOTE — Telephone Encounter (Signed)
2nd attempt Patient called, left VM to return the call to the triage nurse line to speak to the NT.   Summary: pain on the left leg for two months.   Pt stated has been experiencing pain on the left leg for two months. Denied swelling.  No appointments available.  Seeking clinical advice.

## 2023-06-25 ENCOUNTER — Encounter (HOSPITAL_COMMUNITY): Payer: Self-pay | Admitting: Emergency Medicine

## 2023-06-25 ENCOUNTER — Emergency Department (HOSPITAL_COMMUNITY)
Admission: EM | Admit: 2023-06-25 | Discharge: 2023-06-25 | Disposition: A | Discharge status: Home / Self Care | Payer: Commercial Managed Care - HMO | Attending: Emergency Medicine | Admitting: Emergency Medicine

## 2023-06-25 ENCOUNTER — Emergency Department (HOSPITAL_BASED_OUTPATIENT_CLINIC_OR_DEPARTMENT_OTHER): Payer: Commercial Managed Care - HMO

## 2023-06-25 ENCOUNTER — Emergency Department (HOSPITAL_COMMUNITY): Payer: Commercial Managed Care - HMO

## 2023-06-25 DIAGNOSIS — Z7982 Long term (current) use of aspirin: Secondary | ICD-10-CM | POA: Diagnosis not present

## 2023-06-25 DIAGNOSIS — I10 Essential (primary) hypertension: Secondary | ICD-10-CM | POA: Diagnosis not present

## 2023-06-25 DIAGNOSIS — Z85820 Personal history of malignant melanoma of skin: Secondary | ICD-10-CM | POA: Insufficient documentation

## 2023-06-25 DIAGNOSIS — M79605 Pain in left leg: Secondary | ICD-10-CM | POA: Insufficient documentation

## 2023-06-25 DIAGNOSIS — Z79899 Other long term (current) drug therapy: Secondary | ICD-10-CM | POA: Diagnosis not present

## 2023-06-25 DIAGNOSIS — M79606 Pain in leg, unspecified: Secondary | ICD-10-CM

## 2023-06-25 LAB — CBC
HCT: 41.8 % (ref 36.0–46.0)
Hemoglobin: 14.3 g/dL (ref 12.0–15.0)
MCH: 32.2 pg (ref 26.0–34.0)
MCHC: 34.2 g/dL (ref 30.0–36.0)
MCV: 94.1 fL (ref 80.0–100.0)
Platelets: 185 K/uL (ref 150–400)
RBC: 4.44 MIL/uL (ref 3.87–5.11)
RDW: 13 % (ref 11.5–15.5)
WBC: 7.7 K/uL (ref 4.0–10.5)
nRBC: 0 % (ref 0.0–0.2)

## 2023-06-25 LAB — BASIC METABOLIC PANEL WITH GFR
Anion gap: 8 (ref 5–15)
BUN: 10 mg/dL (ref 8–23)
CO2: 25 mmol/L (ref 22–32)
Calcium: 9.2 mg/dL (ref 8.9–10.3)
Chloride: 105 mmol/L (ref 98–111)
Creatinine, Ser: 0.62 mg/dL (ref 0.44–1.00)
GFR, Estimated: >60 mL/min
Glucose, Bld: 75 mg/dL (ref 70–99)
Potassium: 3.7 mmol/L (ref 3.5–5.1)
Sodium: 138 mmol/L (ref 135–145)

## 2023-06-25 MED ORDER — ACETAMINOPHEN 500 MG PO TABS
1000.0000 mg | ORAL_TABLET | Freq: Once | ORAL | Status: AC
  Administered 2023-06-25: 1000 mg via ORAL
  Filled 2023-06-25: qty 2

## 2023-06-25 NOTE — Discharge Instructions (Addendum)
You were seen today for leg pain.  While you were here we did an x-ray of your hip and an ultrasound to look for blood clots.  This did not show any broken bones, dislocated bones, or blood clots.  It is unclear what is causing your pain, though it is possible it is from muscle overuse or strain.  To manage your pain please take over-the-counter ibuprofen and Tylenol as needed.  You can try ice, elevating, and rest for your symptoms as well.  Please call your PCP to schedule an appointment for 1 week for reevaluation.  Return to the ED with worsening pain, inability to walk, numbness or tingling, swelling of your leg, or any new or concerning symptoms.

## 2023-06-25 NOTE — ED Notes (Signed)
Vascular at bedside

## 2023-06-25 NOTE — ED Provider Notes (Signed)
  Physical Exam  BP (!) 207/93   Pulse 97   Temp 97.8 F (36.6 C) (Oral)   Resp 20   SpO2 97%   Physical Exam  Procedures  Procedures  ED Course / MDM   Clinical Course as of 06/25/23 1712  Fri Jun 25, 2023  1241 BP(!): 207/93 [JR]  1523 F/u DVT study - if neg can d/c to PCP [GD]    Clinical Course User Index [GD] Karmen Stabs, MD [JR] Gareth Eagle, PA-C   Medical Decision Making Amount and/or Complexity of Data Reviewed Labs: ordered. Radiology: ordered.  Risk OTC drugs.   67 y/o F here for leg pain. Getting a DVT study. Plan is f/u DVT study. Low concern for PE.   Patient's DVT ultrasound resulted with no DVT or other abnormality seen.  Reviewed her workup which included this ultrasound and a left hip x-ray with pelvic x-ray, shows no fracture or dislocation.  She had basic labs here including a CBC and a metabolic panel that are unremarkable.  Unclear etiology for her pain at this time, acute life-threatening pathology is exceedingly unlikely.  Reassessed the patient updated her about her imaging findings.  At this time she request to go home.  She feels safe after both discharge.  Able to call PCP for follow-up within 1 week.  We discussed supportive measures for possibly muscular type pain including rest, ice, elevation, NSAIDs as needed.  General precautions were discussed.  She was discharged in stable condition without further event.       Karmen Stabs, MD 06/25/23 [place]    Gerhard Munch, MD 06/26/23 [phone]

## 2023-06-25 NOTE — Progress Notes (Signed)
Left lower extremity venous duplex completed. Results in epic and relayed to [employer].   [facility], RVT.

## 2023-06-25 NOTE — Telephone Encounter (Signed)
Left voice mail to call the office back so I can help her.

## 2023-06-25 NOTE — ED Provider Notes (Signed)
[place] EMERGENCY DEPARTMENT AT [facility] Provider Note   CSN: [id number] Arrival date & time: 06/25/23  1216     History  Chief Complaint  Patient presents with   Leg Pain   HPI Dorothy [name] is a 67 y.o. female with chronic bronchitis, longtime smoker, hypertension and history of malignant melanoma presenting for leg pain.  Started about a month ago.  Located in the left upper "groin" and at times she feels it in the left lateral upper leg as well.  Pain is worse with walking.  Denies trauma to her leg.  Denies urinary symptoms.  Denies shortness of breath and chest pain.  Also mention that she has not been taking her blood pressure medication for a week now.  Denies headache visual disturbance, chest pain and abdominal pain.  Denies back pain and abdominal pain.   Leg Pain      Home Medications Prior to Admission medications   Medication Sig Start Date End Date Taking? Authorizing Provider  amLODipine (NORVASC) 10 MG tablet Take 1 tablet (10 mg total) by mouth daily. 09/23/22   Sarina Ill, DO  aspirin EC 81 MG tablet Take 1 tablet (81 mg total) by mouth daily. Swallow whole. 09/23/22   Sarina Ill, DO  atorvastatin (LIPITOR) 80 MG tablet Take 1 tablet (80 mg total) by mouth at bedtime. 09/22/22   Sarina Ill, DO  FLUoxetine (PROZAC) 20 MG capsule Take 1 capsule (20 mg total) by mouth daily. 09/23/22   Sarina Ill, DO  risperiDONE (RISPERDAL) 1 MG tablet Take 1 tablet (1 mg total) by mouth at bedtime. 09/22/22   Sarina Ill, DO  traZODone (DESYREL) 150 MG tablet Take 1 tablet (150 mg total) by mouth at bedtime. 09/22/22   Sarina Ill, DO      Allergies    Patient has no known allergies.    Review of Systems   See HPI for pertinent positives  Physical Exam Updated Vital Signs BP (!) 207/93   Pulse 97   Temp 97.8 F (36.6 C) (Oral)   Resp 20   SpO2 97%  Physical Exam Constitutional:       Appearance: Normal appearance.  HENT:     Head: Normocephalic.     Nose: Nose normal.  Eyes:     Conjunctiva/sclera: Conjunctivae normal.  Cardiovascular:     Pulses:          Radial pulses are 2+ on the right side and 2+ on the left side.       Dorsalis pedis pulses are 2+ on the right side and 2+ on the left side.  Pulmonary:     Effort: Pulmonary effort is normal.  Musculoskeletal:     Left hip: No tenderness. Normal range of motion.     Right lower leg: No edema.     Left lower leg: No edema.     Right foot: Normal capillary refill. Normal pulse.     Left foot: Normal capillary refill. Normal pulse.       Legs:     Comments: Able to ambulate and bear weight.  Cap refill in lower extremities is brisk.  Both extremities are warm but not hot.  No edema noted.  Patient does have tenderness with palpation of the left calf.  Neurological:     Mental Status: She is alert.  Psychiatric:        Mood and Affect: Mood normal.     ED Results /  Procedures / Treatments   Labs (all labs ordered are listed, but only abnormal results are displayed) Labs Reviewed  BASIC METABOLIC PANEL  CBC    EKG None  Radiology VAS [place] LOWER EXTREMITY VENOUS (DVT) (7a-7p)  Result Date: 06/25/2023  Lower Venous DVT Study Patient Name:  Dorothy [name]  Date of Exam:   06/25/2023 Medical Rec #: [id number]           Accession #:    [id number] Date of Birth: 1956/02/09          Patient Gender: F Patient Age:   47 years Exam Location:  [facility] Procedure:      VAS [place] LOWER EXTREMITY VENOUS (DVT) Referring Phys: Riki Sheer --------------------------------------------------------------------------------  Indications: Pain.  Comparison Study: No prior exam. Performing Technologist: Fernande Bras  Examination Guidelines: A complete evaluation includes B-mode imaging, spectral Doppler, color Doppler, and power Doppler as needed of all accessible portions of each vessel. Bilateral testing  is considered an integral part of a complete examination. Limited examinations for reoccurring indications may be performed as noted. The reflux portion of the exam is performed with the patient in reverse Trendelenburg.  +-----+---------------+---------+-----------+----------+--------------+ RIGHTCompressibilityPhasicitySpontaneityPropertiesThrombus Aging +-----+---------------+---------+-----------+----------+--------------+ CFV  Full           Yes      Yes                                 +-----+---------------+---------+-----------+----------+--------------+   +---------+---------------+---------+-----------+----------+--------------+ LEFT     CompressibilityPhasicitySpontaneityPropertiesThrombus Aging +---------+---------------+---------+-----------+----------+--------------+ CFV      Full           Yes      Yes                                 +---------+---------------+---------+-----------+----------+--------------+ SFJ      Full                                                        +---------+---------------+---------+-----------+----------+--------------+ FV Prox  Full                                                        +---------+---------------+---------+-----------+----------+--------------+ FV Mid   Full                                                        +---------+---------------+---------+-----------+----------+--------------+ FV DistalFull                                                        +---------+---------------+---------+-----------+----------+--------------+ PFV      Full                                                        +---------+---------------+---------+-----------+----------+--------------+  POP      Full           Yes      Yes                                 +---------+---------------+---------+-----------+----------+--------------+ PTV      Full                                                         +---------+---------------+---------+-----------+----------+--------------+ PERO     Full                                                        +---------+---------------+---------+-----------+----------+--------------+     Summary: RIGHT: - No evidence of common femoral vein obstruction.   LEFT: - There is no evidence of deep vein thrombosis in the lower extremity.  - No cystic structure found in the popliteal fossa.  *See table(s) above for measurements and observations.    Preliminary     Procedures Procedures    Medications Ordered in ED Medications  acetaminophen (TYLENOL) tablet 1,000 mg (1,000 mg Oral Given 06/25/23 1340)    ED Course/ Medical Decision Making/ A&P Clinical Course as of 06/25/23 1549  Fri Jun 25, 2023  1241 BP(!): 207/93 [JR]  1523 F/u DVT study - if neg can d/c to PCP [GD]    Clinical Course User Index [GD] Karmen Stabs, MD [JR] Gareth Eagle, PA-C                                 Medical Decision Making Amount and/or Complexity of Data Reviewed Labs: ordered. Radiology: ordered.  Risk OTC drugs.   67 year old well-appearing female presenting for leg pain.  Exam notable for tenderness palpation to the left inner thigh and left calf tenderness.  DDx includes DVT, hip fracture, cellulitis, other.  Primary concern is DVT.  Ultrasound is pending.  Labs are unremarkable.  Signed out patient to Dr. Karmen Stabs.  She will follow-up on ultrasound results.  If negative, patient can be discharged with follow-up with PCP for leg pain.  If positive she will likely need anticoagulation therapy.        Final Clinical Impression(s) / ED Diagnoses Final diagnoses:  Left leg pain    Rx / DC Orders ED Discharge Orders     None         Gareth Eagle, PA-C 06/25/23 1549    Gwyneth Sprout, MD 06/29/23 2346

## 2023-06-25 NOTE — ED Triage Notes (Signed)
Pt here from home with c/o left upper leg , pain is worse with walking and standing no swelling or redness or trauma noted

## 2023-07-09 ENCOUNTER — Ambulatory Visit: Payer: Self-pay | Admitting: *Deleted

## 2023-07-09 NOTE — Telephone Encounter (Signed)
Message from [place] M sent at 07/09/2023 12:58 PM EST  Summary: BP Advice   Pt is calling to report that her BP is 146/56 is it ok to take 2 BP pills? Pt has a headache          Call History  Contact Date/Time Type Contact Phone/Fax User  07/09/2023 12:56 PM EST Phone (Incoming) Dorothy [name], Dorothy [name] (Self) [phone] Rexene Edison) Valora Piccolo

## 2023-07-09 NOTE — Telephone Encounter (Signed)
Reason for Disposition  [1] New headache AND [2] age > 79  Answer Assessment - Initial Assessment Questions 1. LOCATION: "Where does it hurt?"      Yesterday as day went on I could feel my BP going up.  I could n't find anyone to let me know about taking 2 pills.   I took 2 BP pills myself.   I thought I was going to have a stroke.   Today  my BP is 145/76.  I feel it rising again?   I'm under a lot of stress.   I made a chicken pie that has a lot of salt in it.   Can I take a 2nd pill? My BP is always high.   I can feel it in my head.  It's a headache in the back of my head.   I took my 2nd dose of BP medicine yesterday and I took a ibuprofen and that helped.    2. ONSET: "When did the headache start?" (Minutes, hours or days)      Yesterday 3. PATTERN: "Does the pain come and go, or has it been constant since it started?"     Comes and goes 4. SEVERITY: "How bad is the pain?" and "What does it keep you from doing?"  (e.g., Scale 1-10; mild, moderate, or severe)   - MILD (1-3): doesn't interfere with normal activities    - MODERATE (4-7): interferes with normal activities or awakens from sleep    - SEVERE (8-10): excruciating pain, unable to do any normal activities        The headache is yesterday and today 5. RECURRENT SYMPTOM: "Have you ever had headaches before?" If Yes, ask: "When was the last time?" and "What happened that time?"      No 6. CAUSE: "What do you think is causing the headache?"     My BP being high, I think 7. MIGRAINE: "Have you been diagnosed with migraine headaches?" If Yes, ask: "Is this headache similar?"      A year ago I was watching TV and I saw a black line across my vision.   I was told to go to he ED which I did.    I had a mini stroke.   8. HEAD INJURY: "Has there been any recent injury to the head?"      Not asked 9. OTHER SYMPTOMS: "Do you have any other symptoms?" (fever, stiff neck, eye pain, sore throat, cold symptoms)     I'm worried I'm going to have a  stroke 10. PREGNANCY: "Is there any chance you are pregnant?" "When was your last menstrual period?"       N/A due to age  Protocols used: Headache-A-AH  Chief Complaint: Worried about BP being high and wanting to take 2 of her BP pills.   Reading is 145/76.  Symptoms: Headache in the back of her head since yesterday.   Ibuprofen made it go away. Frequency: Since yesterday.   Pertinent Negatives: Patient denies knowing what her normal BP is. Disposition: [] ED /[] Urgent Care (no appt availability in office) / [x] Appointment(In office/virtual)/ []  [facility]/ [] Home Care/ [] Refused Recommended Disposition /[] [facility] [place]/ []  Follow-up with PCP Additional Notes: Appt made with Ricky Stabs, NP for 07/28/2023 at 1:20.    When I was scheduling her appt. She mentioned something about this being the emergency department how can y'all be booked up?   I let her know this is not the emergency department,  she had called Primary Care at [facility].  She then asked isn't there an emergency department beside of y'all?   I let her know there was not an emergency department next to [place] but an urgent care was there.  She said,  "Oh".    I asked if she was wanting to go to the urgent care, but I was glad to make her an appt. Here.   She then asked me if she should go to the ED for her concerns.   I let her know that I didn't feel she needed to go to the ED I could make her an appt.   Then she asked me do you think I should take 2 BP pills?   I let her know I could not advise her on that.   She would need to see her provider, Ricky Stabs, NP for medication adjustments.   She was agreeable to making an appt. Which I did.

## 2023-07-28 ENCOUNTER — Telehealth: Payer: Self-pay | Admitting: Family

## 2023-07-28 ENCOUNTER — Encounter: Payer: Managed Care, Other (non HMO) | Admitting: Family

## 2023-07-28 NOTE — Progress Notes (Signed)
Erroneous encounter-disregard

## 2023-07-28 NOTE — Telephone Encounter (Signed)
Called patient patient stated she forgot appointment , has been having a lot of money issues she will call back to schedule appointment.

## 2023-09-08 ENCOUNTER — Encounter: Payer: Self-pay | Admitting: Family

## 2023-09-08 ENCOUNTER — Ambulatory Visit (INDEPENDENT_AMBULATORY_CARE_PROVIDER_SITE_OTHER): Payer: Commercial Managed Care - HMO | Admitting: Family

## 2023-09-08 VITALS — BP 168/97 | HR 81 | Temp 97.9°F | Ht 66.0 in | Wt 151.0 lb

## 2023-09-08 DIAGNOSIS — Z131 Encounter for screening for diabetes mellitus: Secondary | ICD-10-CM | POA: Diagnosis not present

## 2023-09-08 DIAGNOSIS — Z0000 Encounter for general adult medical examination without abnormal findings: Secondary | ICD-10-CM | POA: Diagnosis not present

## 2023-09-08 DIAGNOSIS — Z1329 Encounter for screening for other suspected endocrine disorder: Secondary | ICD-10-CM

## 2023-09-08 DIAGNOSIS — Z1159 Encounter for screening for other viral diseases: Secondary | ICD-10-CM

## 2023-09-08 DIAGNOSIS — Z1211 Encounter for screening for malignant neoplasm of colon: Secondary | ICD-10-CM

## 2023-09-08 DIAGNOSIS — Z13820 Encounter for screening for osteoporosis: Secondary | ICD-10-CM

## 2023-09-08 DIAGNOSIS — Z13228 Encounter for screening for other metabolic disorders: Secondary | ICD-10-CM

## 2023-09-08 DIAGNOSIS — Z122 Encounter for screening for malignant neoplasm of respiratory organs: Secondary | ICD-10-CM

## 2023-09-08 DIAGNOSIS — I10 Essential (primary) hypertension: Secondary | ICD-10-CM

## 2023-09-08 DIAGNOSIS — Z124 Encounter for screening for malignant neoplasm of cervix: Secondary | ICD-10-CM

## 2023-09-08 DIAGNOSIS — Z1231 Encounter for screening mammogram for malignant neoplasm of breast: Secondary | ICD-10-CM

## 2023-09-08 DIAGNOSIS — E785 Hyperlipidemia, unspecified: Secondary | ICD-10-CM

## 2023-09-08 MED ORDER — AMLODIPINE BESYLATE 10 MG PO TABS: 10.0000 mg | ORAL_TABLET | Freq: Every day | ORAL | 0 refills | Status: DC

## 2023-09-08 NOTE — Progress Notes (Signed)
 Patient ID: Dorothy [name], female    DOB: 05-Jul-1956  MRN: [id number]  CC: Annual Exam  Subjective: Dorothy [name] is a 68 y.o. female who presents for annual exam.   Her concerns today include:  - States she only takes Amlodipine  when she feels like she needs it. She does not complain of red flag symptoms such as but not limited to chest pain, shortness of breath, worst headache of life, nausea/vomiting.  - Reports she is not taking Atorvastatin . - Patient declined pap smear.  - Upcoming appointment with Dermatology.   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 30 tablet 3   aspirin  EC 81 MG tablet Take 1 tablet (81 mg total) by mouth daily. Swallow whole. (Patient not taking: Reported on 09/08/2023) 30 tablet 12   atorvastatin  (LIPITOR ) 80 MG tablet Take 1 tablet (80 mg total) by mouth at bedtime. (Patient not taking: Reported on 09/08/2023) 30 tablet 3   FLUoxetine  (PROZAC ) 20 MG capsule Take 1 capsule (20 mg total) by mouth daily. (Patient not taking: Reported on 09/08/2023) 30 capsule 3   risperiDONE  (RISPERDAL ) 1 MG tablet Take 1 tablet (1 mg total) by mouth at bedtime. (Patient not taking:  Reported on 09/08/2023) 30 tablet 3   No current facility-administered medications on file prior to visit.    No Known Allergies  Social History   Socioeconomic History   Marital status: Divorced    Spouse name: Not on file   Number of children: Not on file   Years of education: Not on file   Highest education level: Not on file  Occupational History   Not on file  Tobacco Use   Smoking status: Every Day    Current packs/day: 1.50    Average packs/day: 1.5 packs/day for 51.0 years (76.6 ttl pk-yrs)    Types: Cigarettes    Start date: 08/24/1972    Passive exposure: Current   Smokeless tobacco: Never  Vaping Use   Vaping status: Never Used  Substance and Sexual Activity   Alcohol use: Not Currently    Comment: >1 pint vodka daily and wine   Drug use: Yes    Types: Marijuana   Sexual activity: Not on file  Other Topics Concern   Not on file  Social History Narrative   Not on file   Social Drivers of Health   Financial Resource Strain: Not on file  Food Insecurity: No Food Insecurity (09/16/2022)   Hunger Vital Sign    Worried About Running Out of Food in the Last Year: Never true    Ran Out of Food in the Last Year: Never true  Transportation Needs: No Transportation Needs (09/16/2022)   PRAPARE - Transportation    Lack of Transportation (  Medical): No    Lack of Transportation (Non-Medical): No  Physical Activity: Not on file  Stress: Not on file  Social Connections: Not on file  Intimate Partner Violence: Not At Risk (09/16/2022)   Humiliation, Afraid, Rape, and Kick questionnaire    Fear of Current or Ex-Partner: No    Emotionally Abused: No    Physically Abused: No    Sexually Abused: No    Family History  Problem Relation Age of Onset   Cirrhosis Father     Past Surgical History:  Procedure Laterality Date   BASAL CELL CARCINOMA EXCISION  2012   Right side nose    BREAST ENHANCEMENT SURGERY  2002   ELBOW SURGERY     Right elbow surgery---Ruptured  tendon    ROS: Review of Systems Negative except as stated above  PHYSICAL EXAM: BP (!) 168/97   Pulse 81   Temp 97.9 F (36.6 C) (Oral)   Ht 5\' 6"  (1.676 m)   Wt 151 lb (68.5 kg)   SpO2 95%   BMI 24.37 kg/m   Physical Exam HENT:     Head: Normocephalic and atraumatic.     Right Ear: Tympanic membrane, ear canal and external ear normal.     Left Ear: Tympanic membrane, ear canal and external ear normal.     Nose: Nose normal.     Mouth/Throat:     Mouth: Mucous membranes are moist.     Pharynx: Oropharynx is clear.  Eyes:     Extraocular Movements: Extraocular movements intact.     Conjunctiva/sclera: Conjunctivae normal.     Pupils: Pupils are equal, round, and reactive to light.  Neck:     Thyroid: No thyroid mass, thyromegaly or thyroid tenderness.  Cardiovascular:     Rate and Rhythm: Normal rate and regular rhythm.     Pulses: Normal pulses.     Heart sounds: Normal heart sounds.  Pulmonary:     Effort: Pulmonary effort is normal.     Breath sounds: Normal breath sounds.  Chest:     Comments: Patient declined.  Abdominal:     General: Bowel sounds are normal.     Palpations: Abdomen is soft.  Genitourinary:    Comments: Patient declined.  Musculoskeletal:        General: Normal range of motion.     Right shoulder: Normal.     Left shoulder: Normal.     Right upper arm: Normal.     Left upper arm: Normal.     Right elbow: Normal.     Left elbow: Normal.     Right forearm: Normal.     Left forearm: Normal.     Right wrist: Normal.     Left wrist: Normal.     Right hand: Normal.     Left hand: Normal.     Cervical back: Normal, normal range of motion and neck supple.     Thoracic back: Normal.     Lumbar back: Normal.     Right hip: Normal.     Left hip: Normal.     Right upper leg: Normal.     Left upper leg: Normal.     Right knee: Normal.     Left knee: Normal.     Right lower leg: Normal.     Left lower leg: Normal.     Right ankle:  Normal.     Left ankle: Normal.     Right foot: Normal.     Left foot:  Normal.  Skin:    General: Skin is warm and dry.     Capillary Refill: Capillary refill takes less than 2 seconds.     Comments: Pinpoint spot of upper back, no drainage.   Neurological:     General: No focal deficit present.     Mental Status: She is alert and oriented to person, place, and time.  Psychiatric:        Mood and Affect: Mood normal.        Behavior: Behavior normal.      ASSESSMENT AND PLAN: 1. Annual physical exam (Primary) - Counseled on 150 minutes of exercise per week as tolerated, healthy eating (including decreased daily intake of saturated fats, cholesterol, added sugars, sodium), STI prevention, and routine healthcare maintenance.  2. Screening for metabolic disorder - Routine screening.  - Hepatic Function Panel  3. Diabetes mellitus screening - Routine screening.  - Hemoglobin A1c  4. Thyroid disorder screen - Routine screening.  - TSH  5. Need for hepatitis C screening test - Routine screening.  - Hepatitis C Antibody  6. Encounter for screening mammogram for malignant neoplasm of breast - Routine screening.  - MM Digital Screening; Future  7. Cervical cancer screening - Referral to Gynecology for evaluation/management. - Ambulatory referral to Gynecology  8. Encounter for screening for lung cancer - Routine screening.  - CT CHEST LUNG CA SCREEN LOW DOSE W/O CM; Future  9. Osteoporosis screening - Routine screening.  - DG Bone Density; Future  10. Colon cancer screening - Referral to Gastroenterology for colon cancer screening by colonoscopy. - Ambulatory referral to Gastroenterology  11. Primary hypertension - Blood pressure not at goal during today's visit. Patient asymptomatic without chest pressure, chest pain, palpitations, shortness of breath, worst headache of life, and any additional red flag symptoms. - Resume Amlodipine  as prescribed.  - Counseled on  blood pressure goal of less than 140/90, low-sodium, DASH diet, medication compliance, 150 minutes of moderate intensity exercise per week as tolerated. Discussed medication compliance, adverse effects. - Follow-up with primary provider in 2 weeks or sooner if needed.  - amLODipine  (NORVASC ) 10 MG tablet; Take 1 tablet (10 mg total) by mouth daily.  Dispense: 90 tablet; Refill: 0  12. Hyperlipidemia, unspecified hyperlipidemia type - Patient reports she is not taking Atorvastatin . - Routine screening.  - Follow-up with primary provider as scheduled.  - Lipid panel   Patient was given the opportunity to ask questions.  Patient verbalized understanding of the plan and was able to repeat key elements of the plan. Patient was given clear instructions to go to Emergency Department or return to medical center if symptoms don't improve, worsen, or new problems develop.The patient verbalized understanding.   Orders Placed This Encounter  Procedures   MM Digital Screening   CT CHEST LUNG CA SCREEN LOW DOSE W/O CM   DG Bone Density   Hepatitis C Antibody   Hepatic Function Panel   Hemoglobin A1c   TSH   Lipid panel   Ambulatory referral to Gastroenterology   Ambulatory referral to Gynecology     Requested Prescriptions   Signed Prescriptions Disp Refills   amLODipine  (NORVASC ) 10 MG tablet 90 tablet 0    Sig: Take 1 tablet (10 mg total) by mouth daily.    Return in about 1 year (around 09/07/2024) for Physical per patient preference and 2 weeks blood pressure check.  Senaida Dama, NP

## 2023-09-08 NOTE — Progress Notes (Signed)
 Patient wants a knot on middle of her shoulder blades.   Patient declined pap smear.   Patient states she wants labs done.

## 2023-09-09 ENCOUNTER — Telehealth: Payer: Self-pay | Admitting: Family

## 2023-09-09 NOTE — Telephone Encounter (Signed)
Pt. States she has seen her labs in My Chart and wants to discuss the results. Labs have not resulted yet.

## 2023-09-17 LAB — LIPID PANEL

## 2023-09-17 LAB — HEPATIC FUNCTION PANEL

## 2023-09-17 LAB — HEMOGLOBIN A1C
Est. average glucose Bld gHb Est-mCnc: 114 mg/dL
Hgb A1c MFr Bld: 5.6 % (ref 4.8–5.6)

## 2023-09-17 LAB — HEPATITIS C ANTIBODY: Hep C Virus Ab: NONREACTIVE

## 2023-09-17 LAB — TSH

## 2023-09-21 ENCOUNTER — Encounter: Payer: Self-pay | Admitting: Family

## 2023-09-21 ENCOUNTER — Other Ambulatory Visit: Payer: Self-pay | Admitting: Family

## 2023-09-21 DIAGNOSIS — E785 Hyperlipidemia, unspecified: Secondary | ICD-10-CM

## 2023-09-21 DIAGNOSIS — Z1329 Encounter for screening for other suspected endocrine disorder: Secondary | ICD-10-CM

## 2023-09-21 DIAGNOSIS — Z13228 Encounter for screening for other metabolic disorders: Secondary | ICD-10-CM

## 2023-09-22 ENCOUNTER — Encounter: Payer: Self-pay | Admitting: Family

## 2023-09-22 ENCOUNTER — Encounter (INDEPENDENT_AMBULATORY_CARE_PROVIDER_SITE_OTHER): Payer: Commercial Managed Care - HMO | Admitting: Family

## 2023-09-22 ENCOUNTER — Telehealth: Payer: Self-pay | Admitting: Family

## 2023-09-22 NOTE — Telephone Encounter (Signed)
Called patient to reschedule missed appointment no answer left voicemail

## 2023-09-22 NOTE — Progress Notes (Signed)
Erroneous encounter-disregard

## 2023-09-30 DIAGNOSIS — F1020 Alcohol dependence, uncomplicated: Secondary | ICD-10-CM | POA: Diagnosis not present

## 2023-10-05 DIAGNOSIS — F1020 Alcohol dependence, uncomplicated: Secondary | ICD-10-CM | POA: Diagnosis not present

## 2023-10-06 ENCOUNTER — Inpatient Hospital Stay: Admission: RE | Admit: 2023-10-06 | Payer: Commercial Managed Care - HMO | Source: Ambulatory Visit

## 2023-11-02 ENCOUNTER — Encounter: Payer: Commercial Managed Care - HMO | Admitting: Advanced Practice Midwife

## 2023-11-03 ENCOUNTER — Telehealth: Payer: Self-pay

## 2023-11-03 NOTE — Telephone Encounter (Signed)
 Reason for CRM: Patient made an appointment for March 31st to see Ricky Stabs. In the meantime, she has only one pill left of her traZODone - she mentioned that she takes 3-4 of them everyday and that she can't sleep without them. Her last prescription was prescribed by another provider when she was going to alcohol and substance meetings. Her local [facility] can verify her dosage. Can the clinic manage the prescription and provide a refill prior to the appointment? Please call patient after 11 am if possible to discuss.

## 2023-11-04 ENCOUNTER — Telehealth: Payer: Self-pay

## 2023-11-04 NOTE — Telephone Encounter (Signed)
 Reason for CRM: Patient stated that she needs the traZODone refilled. Patient stated that she can't sleep without them. The last prescription was prescribed by another provider when she was going to alcohol and substance meetings. Patient reports that [facility] can verify the dosage. Patient requesting a return call asap. Patient stated that she will continue to call and if need be she will come in to the office.

## 2023-11-04 NOTE — Telephone Encounter (Signed)
 Call patient with update of supervising physician's advisement Georganna Skeans, MD).

## 2023-11-04 NOTE — Telephone Encounter (Signed)
 Spoke to patient , informed her of Dr. Andrey Campanile and Amy's suggestions. Patient verbalized understanding, and didn't have any questions or anything about it.

## 2023-11-04 NOTE — Telephone Encounter (Signed)
 Dr. Andrey Campanile please advise. Trazodone 150 mg by mouth at bedtime last prescribed 09/22/2022.

## 2023-11-04 NOTE — Telephone Encounter (Signed)
 I spoke to patient, and she takes 150mg  3 tablets at bedtime. The Dr that used to fill it for them she doesn't see anymore.

## 2023-11-22 ENCOUNTER — Ambulatory Visit (INDEPENDENT_AMBULATORY_CARE_PROVIDER_SITE_OTHER): Admitting: Family

## 2023-11-22 VITALS — BP 158/94 | HR 86 | Temp 98.6°F | Ht 66.0 in | Wt 145.6 lb

## 2023-11-22 DIAGNOSIS — F32A Depression, unspecified: Secondary | ICD-10-CM

## 2023-11-22 DIAGNOSIS — G4700 Insomnia, unspecified: Secondary | ICD-10-CM

## 2023-11-22 DIAGNOSIS — I10 Essential (primary) hypertension: Secondary | ICD-10-CM

## 2023-11-22 MED ORDER — FLUOXETINE HCL 10 MG PO CAPS: 10.0000 mg | ORAL_CAPSULE | Freq: Every day | ORAL | 0 refills | Status: DC

## 2023-11-22 MED ORDER — AMLODIPINE BESYLATE 10 MG PO TABS: 10.0000 mg | ORAL_TABLET | Freq: Every day | ORAL | 0 refills | Status: DC

## 2023-11-22 NOTE — Progress Notes (Signed)
 Patient states she needs refill trazodone, asking  for an anti depressant.   Asking if you can take over Trazodone.

## 2023-11-22 NOTE — Progress Notes (Signed)
 Patient ID: Dorothy [name], female    DOB: 06-24-1956  MRN: [id number]  CC: Chronic Conditions Follow-Up  Subjective: Dorothy [name] is a 68 y.o. female who presents for chronic conditions follow-up.   Her concerns today include:  - States she has not taken Amlodipine in a while. She does not complain of red flag symptoms such as but not limited to chest pain, shortness of breath, worst headache of life, nausea/vomiting.  - Reports doing well on Trazodone 300 mg daily at bedtime for insomnia. States she does not need refills because she has a refill available at pharmacy for pickup. - Depression. States primarily related to unable to find employment. Reports in the past she did well on Fluoxetine, no issues/concerns. Reports in the past she was established with Psychiatry. She denies thoughts of self-harm, suicidal ideations, homicidal ideations.   Patient Active Problem List   Diagnosis Date Noted   MDD (major depressive disorder), recurrent severe, without psychosis (HCC) 09/15/2022   Anxiety and depression 11/14/2021   Insomnia 11/14/2021   Hyperlipidemia 10/16/2021   Amaurosis fugax of right eye    Smoker    TIA (transient ischemic attack) 10/15/2021   HTN (hypertension) 10/15/2021   Chronic bronchitis (HCC) 10/15/2021   Alcohol-induced mood disorder (HCC) 08/28/2019   MDD (major depressive disorder), recurrent episode, moderate (HCC) 08/28/2019   Severe recurrent major depression without psychotic features (HCC) 06/08/2018   Alcohol use disorder, severe, dependence (HCC) 06/08/2018     Current Outpatient Medications on File Prior to Visit  Medication Sig Dispense Refill   traZODone (DESYREL) 150 MG tablet Take 1 tablet (150 mg total) by mouth at bedtime. 30 tablet 3   aspirin EC 81 MG tablet Take 1 tablet (81 mg total) by mouth daily. Swallow whole. (Patient not taking: Reported on 11/22/2023) 30 tablet 12   atorvastatin (LIPITOR) 80 MG tablet Take 1 tablet (80 mg  total) by mouth at bedtime. (Patient not taking: Reported on 11/22/2023) 30 tablet 3   risperiDONE (RISPERDAL) 1 MG tablet Take 1 tablet (1 mg total) by mouth at bedtime. (Patient not taking: Reported on 11/22/2023) 30 tablet 3   No current facility-administered medications on file prior to visit.    No Known Allergies  Social History   Socioeconomic History   Marital status: Divorced    Spouse name: Not on file   Number of children: Not on file   Years of education: Not on file   Highest education level: Not on file  Occupational History   Not on file  Tobacco Use   Smoking status: Every Day    Current packs/day: 1.50    Average packs/day: 1.5 packs/day for 51.2 years (76.9 ttl pk-yrs)    Types: Cigarettes    Start date: 08/24/1972    Passive exposure: Current   Smokeless tobacco: Never  Vaping Use   Vaping status: Never Used  Substance and Sexual Activity   Alcohol use: Not Currently    Comment: >1 pint vodka daily and wine   Drug use: Yes    Types: Marijuana   Sexual activity: Not on file  Other Topics Concern   Not on file  Social History Narrative   Not on file   Social Drivers of Health   Financial Resource Strain: Not on file  Food Insecurity: No Food Insecurity (09/16/2022)   Hunger Vital Sign    Worried About Running Out of Food in the Last Year: Never true    Ran Out of Food  in the Last Year: Never true  Transportation Needs: No Transportation Needs (09/16/2022)   PRAPARE - Administrator, Civil Service (Medical): No    Lack of Transportation (Non-Medical): No  Physical Activity: Not on file  Stress: Not on file  Social Connections: Not on file  Intimate Partner Violence: Not At Risk (09/16/2022)   Humiliation, Afraid, Rape, and Kick questionnaire    Fear of Current or Ex-Partner: No    Emotionally Abused: No    Physically Abused: No    Sexually Abused: No    Family History  Problem Relation Age of Onset   Cirrhosis Father     Past  Surgical History:  Procedure Laterality Date   BASAL CELL CARCINOMA EXCISION  2012   Right side nose    BREAST ENHANCEMENT SURGERY  2002   ELBOW SURGERY     Right elbow surgery---Ruptured tendon    ROS: Review of Systems Negative except as stated above  PHYSICAL EXAM: BP (!) 158/94   Pulse 86   Temp 98.6 F (37 C) (Oral)   Ht 5\' 6"  (1.676 m)   Wt 145 lb 9.6 oz (66 kg)   SpO2 92%   BMI 23.50 kg/m   Physical Exam HENT:     Head: Normocephalic and atraumatic.     Nose: Nose normal.     Mouth/Throat:     Mouth: Mucous membranes are moist.     Pharynx: Oropharynx is clear.  Eyes:     Extraocular Movements: Extraocular movements intact.     Conjunctiva/sclera: Conjunctivae normal.     Pupils: Pupils are equal, round, and reactive to light.  Cardiovascular:     Rate and Rhythm: Normal rate and regular rhythm.     Pulses: Normal pulses.     Heart sounds: Normal heart sounds.  Pulmonary:     Effort: Pulmonary effort is normal.     Breath sounds: Normal breath sounds.  Musculoskeletal:        General: Normal range of motion.     Cervical back: Normal range of motion and neck supple.  Neurological:     General: No focal deficit present.     Mental Status: She is alert and oriented to person, place, and time.  Psychiatric:        Mood and Affect: Mood normal.        Behavior: Behavior normal.     ASSESSMENT AND PLAN: Note - I discussed plan of care with Georganna Skeans, MD.   1. Primary hypertension (Primary) - Blood pressure not at goal during today's visit. Patient asymptomatic without chest pressure, chest pain, palpitations, shortness of breath, worst headache of life, and any additional red flag symptoms. - Resume Amlodipine as prescribed.  - Counseled on blood pressure goal of less than 130/80, low-sodium, DASH diet, medication compliance, and 150 minutes of moderate intensity exercise per week as tolerated. Counseled on medication adherence and adverse effects. -  Follow-up with primary provider in 4 weeks or sooner if needed. - amLODipine (NORVASC) 10 MG tablet; Take 1 tablet (10 mg total) by mouth daily.  Dispense: 90 tablet; Refill: 0  2. Depression, unspecified depression type - Patient denies thoughts of self-harm, suicidal ideations, homicidal ideations. - Fluoxetine as prescribed. Counseled on medication adherence/adverse effects. - Referral to Psychiatry for evaluation/management.  - Follow-up with primary provider in 4 weeks or sooner if needed. - FLUoxetine (PROZAC) 10 MG capsule; Take 1 capsule (10 mg total) by mouth daily.  Dispense: 90 capsule;  Refill: 0 - Ambulatory referral to Psychiatry  3. Insomnia, unspecified type - Continue Trazodone 300 mg daily at bedtime as prescribed. Counseled on medication adherence/adverse effects. No refills needed as of present. - Referral to Psychiatry for evaluation/management.  - Follow-up with primary provider as scheduled. - Ambulatory referral to Psychiatry  Patient was given the opportunity to ask questions.  Patient verbalized understanding of the plan and was able to repeat key elements of the plan. Patient was given clear instructions to go to Emergency Department or return to medical center if symptoms don't improve, worsen, or new problems develop.The patient verbalized understanding.   Orders Placed This Encounter  Procedures   Ambulatory referral to Psychiatry     Requested Prescriptions   Signed Prescriptions Disp Refills   amLODipine (NORVASC) 10 MG tablet 90 tablet 0    Sig: Take 1 tablet (10 mg total) by mouth daily.   FLUoxetine (PROZAC) 10 MG capsule 90 capsule 0    Sig: Take 1 capsule (10 mg total) by mouth daily.    Return in about 4 weeks (around 12/20/2023) for Follow-Up or next available chronic conditions.  Rema Fendt, NP

## 2024-01-28 ENCOUNTER — Other Ambulatory Visit: Payer: Self-pay | Admitting: Family

## 2024-01-28 NOTE — Telephone Encounter (Signed)
 Copied from CRM [id number]. Topic: Clinical - Medication Refill >> Jan 28, 2024  4:33 PM Carla L wrote: Medication: traZODone  (DESYREL ) 150 MG tablet Pt scheduled visit for 02/18/24. Pt requesting courtesy refill to get by until appt, pt close to running out.   Has the patient contacted their pharmacy? Yes   This is the patient's preferred pharmacy:  [facility] - [place], [place] - [address] [place] [address] Phone: [phone] Fax: [phone]  Is this the correct pharmacy for this prescription? Yes If no, delete pharmacy and type the correct one.   Has the prescription been filled recently? No  Is the patient out of the medication? No  Has the patient been seen for an appointment in the last year OR does the patient have an upcoming appointment? Yes  Can we respond through MyChart? No  Agent: Please be advised that Rx refills may take up to 3 business days. We ask that you follow-up with your pharmacy.

## 2024-01-31 MED ORDER — TRAZODONE HCL 150 MG PO TABS: 150.0000 mg | ORAL_TABLET | Freq: Every day | ORAL | 2 refills | Status: DC

## 2024-01-31 NOTE — Telephone Encounter (Signed)
 Requested Prescriptions  Pending Prescriptions Disp Refills   traZODone  (DESYREL ) 150 MG tablet 30 tablet 2    Sig: Take 1 tablet (150 mg total) by mouth at bedtime.     Psychiatry: Antidepressants - Serotonin Modulator Failed - 01/31/2024 12:38 PM      Failed - Completed PHQ-2 or PHQ-9 in the last 360 days      Passed - Valid encounter within last 6 months    Recent Outpatient Visits           2 months ago Primary hypertension   [facility], [place], NP   4 months ago Annual physical exam   [facility], [place], NP   2 years ago Anxiety and depression   [facility], [place], NP   2 years ago Anxiety and depression   [facility], [place], NP

## 2024-02-18 ENCOUNTER — Other Ambulatory Visit: Payer: Self-pay | Admitting: Family

## 2024-02-18 ENCOUNTER — Encounter: Admitting: Family

## 2024-02-18 ENCOUNTER — Telehealth: Payer: Self-pay | Admitting: Family

## 2024-02-18 DIAGNOSIS — G4700 Insomnia, unspecified: Secondary | ICD-10-CM

## 2024-02-18 MED ORDER — TRAZODONE HCL 150 MG PO TABS: 150.0000 mg | ORAL_TABLET | Freq: Every day | ORAL | 2 refills | Status: DC

## 2024-02-18 NOTE — Telephone Encounter (Signed)
 Patient missed appointment today she had days mixed up , I rescheduled her to 07/02 , but patient still needs refill for medication traZODone  sent to phrarmacy patient only has one pill left.

## 2024-02-18 NOTE — Progress Notes (Signed)
 Erroneous encounter-disregard

## 2024-02-18 NOTE — Telephone Encounter (Signed)
 Complete

## 2024-02-21 NOTE — Telephone Encounter (Signed)
 I called patient and made her aware.

## 2024-02-23 ENCOUNTER — Ambulatory Visit (INDEPENDENT_AMBULATORY_CARE_PROVIDER_SITE_OTHER): Admitting: Family

## 2024-02-23 ENCOUNTER — Encounter: Payer: Self-pay | Admitting: Family

## 2024-02-23 VITALS — BP 136/89 | HR 88 | Resp 16 | Ht 66.0 in | Wt 140.0 lb

## 2024-02-23 DIAGNOSIS — E785 Hyperlipidemia, unspecified: Secondary | ICD-10-CM | POA: Diagnosis not present

## 2024-02-23 DIAGNOSIS — G4700 Insomnia, unspecified: Secondary | ICD-10-CM | POA: Diagnosis not present

## 2024-02-23 DIAGNOSIS — Z139 Encounter for screening, unspecified: Secondary | ICD-10-CM

## 2024-02-23 DIAGNOSIS — R059 Cough, unspecified: Secondary | ICD-10-CM | POA: Diagnosis not present

## 2024-02-23 DIAGNOSIS — I10 Essential (primary) hypertension: Secondary | ICD-10-CM | POA: Diagnosis not present

## 2024-02-23 DIAGNOSIS — F32A Depression, unspecified: Secondary | ICD-10-CM

## 2024-02-23 MED ORDER — ALBUTEROL SULFATE HFA 108 (90 BASE) MCG/ACT IN AERS: 2.0000 | INHALATION_SPRAY | Freq: Four times a day (QID) | RESPIRATORY_TRACT | 2 refills | Status: DC | PRN

## 2024-02-23 MED ORDER — AMLODIPINE BESYLATE 10 MG PO TABS: 10.0000 mg | ORAL_TABLET | Freq: Every day | ORAL | 0 refills | Status: DC

## 2024-02-23 MED ORDER — ATORVASTATIN CALCIUM 80 MG PO TABS: 80.0000 mg | ORAL_TABLET | Freq: Every day | ORAL | 0 refills | Status: DC

## 2024-02-23 MED ORDER — FLUOXETINE HCL 10 MG PO CAPS: 10.0000 mg | ORAL_CAPSULE | Freq: Every day | ORAL | 0 refills | Status: AC

## 2024-02-23 NOTE — Progress Notes (Signed)
 Medication refill, patient wants to know if she needs to be on a inhaler.  Patient coughs all the time. Runny nose.  Patient said she does not want to live anymore. Patient scored a 21 on her PHQ-9

## 2024-02-23 NOTE — Progress Notes (Signed)
 Patient ID: Dorothy [name], female    DOB: 1955-11-05  MRN: [id number]  CC: Chronic Conditions Follow-Up  Subjective: Dorothy [name] is a 68 y.o. female who presents for chronic conditions follow-up.   Her concerns today include:  - Doing well on Amlodipine , no issues/concerns. She does not complain of red flag symptoms such as but not limited to chest pain, shortness of breath, worst headache of life, nausea/vomiting.  - Doing well on Atorvastatin , no issues/concerns.  - Depression. States she hasn't taken Fluoxetine  in a while per her preference. States she is always at home with nothing to do, can't find a job, and doesn't have any friends. States she doesn't have any plans to harm herself. However, patient states she does have a pistol and razor at home so it would only take a few seconds if she really wanted to do so. [employer] x 3 arrived to [employer] to assist patient and provide community resources. Patient was appreciative and checked out at the front desk. - Doing well on Trazodone , no issues/concerns.  - Cough. Denies red flag symptoms. She would like to try an inhaler to see if this helps.   Patient Active Problem List   Diagnosis Date Noted   MDD (major depressive disorder), recurrent severe, without psychosis (HCC) 09/15/2022   Anxiety and depression 11/14/2021   Insomnia 11/14/2021   Hyperlipidemia 10/16/2021   Amaurosis fugax of right eye    Smoker    TIA (transient ischemic attack) 10/15/2021   HTN (hypertension) 10/15/2021   Chronic bronchitis (HCC) 10/15/2021   Alcohol-induced mood disorder (HCC) 08/28/2019   MDD (major depressive disorder), recurrent episode, moderate (HCC) 08/28/2019   Severe recurrent major depression without psychotic features (HCC) 06/08/2018   Alcohol use disorder, severe, dependence (HCC) 06/08/2018     Current Outpatient Medications on File Prior to Visit  Medication Sig Dispense Refill   aspirin   EC 81 MG tablet Take 1 tablet (81 mg total) by mouth daily. Swallow whole. (Patient not taking: Reported on 11/22/2023) 30 tablet 12   risperiDONE  (RISPERDAL ) 1 MG tablet Take 1 tablet (1 mg total) by mouth at bedtime. (Patient not taking: Reported on 11/22/2023) 30 tablet 3   traZODone  (DESYREL ) 150 MG tablet Take 1 tablet (150 mg total) by mouth at bedtime. 30 tablet 2   No current facility-administered medications on file prior to visit.    No Known Allergies  Social History   Socioeconomic History   Marital status: Divorced    Spouse name: Not on file   Number of children: Not on file   Years of education: Not on file   Highest education level: Not on file  Occupational History   Not on file  Tobacco Use   Smoking status: Every Day    Current packs/day: 1.50    Average packs/day: 1.5 packs/day for 51.5 years (77.2 ttl pk-yrs)    Types: Cigarettes    Start date: 08/24/1972    Passive exposure: Current   Smokeless tobacco: Never  Vaping Use   Vaping status: Never Used  Substance and Sexual Activity   Alcohol use: Not Currently    Comment: >1 pint vodka daily and wine   Drug use: Yes    Types: Marijuana   Sexual activity: Not on file  Other Topics Concern   Not on file  Social History Narrative   Not on file   Social Drivers of Health   Financial Resource Strain: Not on file  Food  Insecurity: No Food Insecurity (09/16/2022)   Hunger Vital Sign    Worried About Running Out of Food in the Last Year: Never true    Ran Out of Food in the Last Year: Never true  Transportation Needs: No Transportation Needs (09/16/2022)   PRAPARE - Administrator, Civil Service (Medical): No    Lack of Transportation (Non-Medical): No  Physical Activity: Not on file  Stress: Not on file  Social Connections: Socially Isolated (02/23/2024)   Social Connection and Isolation Panel    Frequency of Communication with Friends and Family: Once a week    Frequency of Social Gatherings  with Friends and Family: Never    Attends Religious Services: More than 4 times per year    Active Member of [employer] or Organizations: No    Attends Banker Meetings: Never    Marital Status: Never married  Intimate Partner Violence: Not At Risk (09/16/2022)   Humiliation, Afraid, Rape, and Kick questionnaire    Fear of Current or Ex-Partner: No    Emotionally Abused: No    Physically Abused: No    Sexually Abused: No    Family History  Problem Relation Age of Onset   Cirrhosis Father     Past Surgical History:  Procedure Laterality Date   BASAL CELL CARCINOMA EXCISION  2012   Right side nose    BREAST ENHANCEMENT SURGERY  2002   ELBOW SURGERY     Right elbow surgery---Ruptured tendon    ROS: Review of Systems Negative except as stated above  PHYSICAL EXAM: BP 136/89   Pulse 88   Resp 16   Ht 5' 6 (1.676 m)   Wt 140 lb (63.5 kg)   SpO2 90%   BMI 22.60 kg/m   Physical Exam HENT:     Head: Normocephalic and atraumatic.     Nose: Nose normal.     Mouth/Throat:     Mouth: Mucous membranes are moist.     Pharynx: Oropharynx is clear.  Eyes:     Extraocular Movements: Extraocular movements intact.     Conjunctiva/sclera: Conjunctivae normal.     Pupils: Pupils are equal, round, and reactive to light.  Cardiovascular:     Rate and Rhythm: Normal rate and regular rhythm.     Pulses: Normal pulses.     Heart sounds: Normal heart sounds.  Pulmonary:     Effort: Pulmonary effort is normal.     Breath sounds: Normal breath sounds.  Musculoskeletal:        General: Normal range of motion.     Cervical back: Normal range of motion and neck supple.  Neurological:     General: No focal deficit present.     Mental Status: She is alert and oriented to person, place, and time.  Psychiatric:        Mood and Affect: Affect is tearful.     ASSESSMENT AND PLAN: 1. Primary hypertension (Primary) - Continue Amlodipine  as prescribed.  - Routine screening.  -  Counseled on blood pressure goal of less than 130/80, low-sodium, DASH diet, medication compliance, and 150 minutes of moderate intensity exercise per week as tolerated. Counseled on medication adherence and adverse effects. - Follow-up with primary provider in 3 months or sooner if needed.  - amLODipine  (NORVASC ) 10 MG tablet; Take 1 tablet (10 mg total) by mouth daily.  Dispense: 90 tablet; Refill: 0 - Basic Metabolic Panel  2. Hyperlipidemia, unspecified hyperlipidemia type - Continue Atorvastatin  as prescribed.  Counseled on medication adherence/adverse effects. - Routine screening.  - Follow-up with primary provider as scheduled. - atorvastatin  (LIPITOR ) 80 MG tablet; Take 1 tablet (80 mg total) by mouth at bedtime.  Dispense: 90 tablet; Refill: 0 - Lipid panel  3. Depression, unspecified depression type - Resume Fluoxetine  as prescribed. Counseled on medication adherence/adverse effects.  - Referral to Psychiatry for evaluation/management.  - Follow-up with primary provider as scheduled. - FLUoxetine  (PROZAC ) 10 MG capsule; Take 1 capsule (10 mg total) by mouth daily.  Dispense: 90 capsule; Refill: 0 - Ambulatory referral to Psychiatry  4. Insomnia, unspecified type - Continue Trazodone  as prescribed. Counseled on medication adherence/adverse effects. No refills needed as of present.  - Follow-up with primary provider as scheduled.  5. Cough, unspecified type - Patient today in office with no cardiopulmonary/acute distress.  - Albuterol inhaler as prescribed. Counseled on medication adherence/adverse effects.  - Diagnostic chest xray for evaluation. - Referral to Pulmonology for evaluation/management.  - Follow-up with primary provider as scheduled. - Ambulatory referral to Pulmonology - albuterol (VENTOLIN HFA) 108 (90 Base) MCG/ACT inhaler; Inhale 2 puffs into the lungs every 6 (six) hours as needed for wheezing or shortness of breath.  Dispense: 18 g; Refill: 2 - DG Chest 2  View; Future  6. Encounter for screening involving social determinants of health (SDoH) - Referral to [facility] Care Management for community resources. - AMB Referral [facility] Care Management   Patient was given the opportunity to ask questions.  Patient verbalized understanding of the plan and was able to repeat key elements of the plan. Patient was given clear instructions to go to Emergency Department or return to medical center if symptoms don't improve, worsen, or new problems develop.The patient verbalized understanding.   Orders Placed This Encounter  Procedures   DG Chest 2 View   Basic Metabolic Panel   Lipid panel   Ambulatory referral to Pulmonology   Ambulatory referral to Psychiatry   AMB Referral [facility] Care Management     Requested Prescriptions   Signed Prescriptions Disp Refills   amLODipine  (NORVASC ) 10 MG tablet 90 tablet 0    Sig: Take 1 tablet (10 mg total) by mouth daily.   atorvastatin  (LIPITOR ) 80 MG tablet 90 tablet 0    Sig: Take 1 tablet (80 mg total) by mouth at bedtime.   FLUoxetine  (PROZAC ) 10 MG capsule 90 capsule 0    Sig: Take 1 capsule (10 mg total) by mouth daily.   albuterol (VENTOLIN HFA) 108 (90 Base) MCG/ACT inhaler 18 g 2    Sig: Inhale 2 puffs into the lungs every 6 (six) hours as needed for wheezing or shortness of breath.    Follow-up with primary provider as scheduled.  Greig JINNY Drones, NP

## 2024-02-23 NOTE — Progress Notes (Signed)
 Patient has not taken her meds. Patient said she has lack of motivation,self worth. Patient is upset because she can not find a job. Patient is very upset and depress.

## 2024-02-24 ENCOUNTER — Telehealth: Payer: Self-pay

## 2024-02-24 NOTE — Telephone Encounter (Signed)
 Copied from CRM [id number]. Topic: Clinical - Prescription Issue >> Feb 24, 2024 12:32 PM Berwyn MATSU wrote: Reason for CRM:  Patient called in stating that the dosage for the medication traZODone  (DESYREL ) 150 MG tablet  was for the wrong dosage she states that she takes 400 mg and the prescription that was sent over yesterday was for 150 mg.  Patient is requesting a new prescription reflecting the dosage the change.   May you please assist.

## 2024-02-24 NOTE — Progress Notes (Signed)
 Complex Care Management Note Care Guide Note  02/24/2024 Name: Dorothy [name] MRN: [id number] DOB: 10-05-1955   Complex Care Management Outreach Attempts: An unsuccessful telephone outreach was attempted today to offer the patient information about available complex care management services.  Follow Up Plan:  Additional outreach attempts will be made to offer the patient complex care management information and services.   Encounter Outcome:  No Answer  Leotis Rase [facility], [facility] Guide  Direct Dial: [phone]  Fax [phone]

## 2024-02-25 LAB — BASIC METABOLIC PANEL WITH GFR
BUN/Creatinine Ratio: 19 (ref 12–28)
BUN: 10 mg/dL (ref 8–27)
CO2: 19 mmol/L — ABNORMAL LOW (ref 20–29)
Calcium: 9.1 mg/dL (ref 8.7–10.3)
Chloride: 100 mmol/L (ref 96–106)
Creatinine, Ser: 0.54 mg/dL — ABNORMAL LOW (ref 0.57–1.00)
Glucose: 78 mg/dL (ref 70–99)
Potassium: 4.4 mmol/L (ref 3.5–5.2)
Sodium: 140 mmol/L (ref 134–144)
eGFR: 102 mL/min/1.73 (ref >59)

## 2024-02-25 LAB — LIPID PANEL
Chol/HDL Ratio: 4.3 ratio (ref 0.0–4.4)
Cholesterol, Total: 245 mg/dL — ABNORMAL HIGH (ref 100–199)
HDL: 57 mg/dL (ref >39)
LDL Chol Calc (NIH): 141 mg/dL — ABNORMAL HIGH (ref 0–99)
Triglycerides: 259 mg/dL — ABNORMAL HIGH (ref 0–149)
VLDL Cholesterol Cal: 47 mg/dL — ABNORMAL HIGH (ref 5–40)

## 2024-02-28 ENCOUNTER — Telehealth: Payer: Self-pay | Admitting: *Deleted

## 2024-02-28 ENCOUNTER — Telehealth: Payer: Self-pay | Admitting: Family

## 2024-02-28 ENCOUNTER — Other Ambulatory Visit: Payer: Self-pay | Admitting: Family

## 2024-02-28 ENCOUNTER — Ambulatory Visit: Payer: Self-pay | Admitting: Family

## 2024-02-28 DIAGNOSIS — J42 Unspecified chronic bronchitis: Secondary | ICD-10-CM

## 2024-02-28 DIAGNOSIS — G4700 Insomnia, unspecified: Secondary | ICD-10-CM

## 2024-02-28 DIAGNOSIS — E785 Hyperlipidemia, unspecified: Secondary | ICD-10-CM

## 2024-02-28 DIAGNOSIS — F17200 Nicotine dependence, unspecified, uncomplicated: Secondary | ICD-10-CM

## 2024-02-28 DIAGNOSIS — I10 Essential (primary) hypertension: Secondary | ICD-10-CM

## 2024-02-28 MED ORDER — TRAZODONE HCL 300 MG PO TABS: 300.0000 mg | ORAL_TABLET | Freq: Every day | ORAL | 0 refills | Status: DC

## 2024-02-28 NOTE — Telephone Encounter (Signed)
 Copied from CRM [id number]. Topic: General - Other >> Feb 28, 2024 12:52 PM Zebedee SAUNDERS wrote: Reason for CRM: Pt called provided information regarding Rx sent to pharmacy for Trazodone  300 mg. Pt does have questions regarding her lab results and oxygen level. She would like a call back at [phone].

## 2024-02-28 NOTE — Telephone Encounter (Signed)
 Per 11/22/2023 office visit encounter note patient was taking Trazodone  300 mg. Will continue with Trazodone  300 mg and new prescription ordered on 02/28/2024.

## 2024-02-28 NOTE — Telephone Encounter (Signed)
 Attempt to call patient back, left message on voicemail to return call.

## 2024-02-28 NOTE — Telephone Encounter (Signed)
 Patient was informed of results. Stated that she has not been taking any of her medications and would begin to take them today.

## 2024-02-28 NOTE — Telephone Encounter (Signed)
 Patient returning call back, Call successfully transferred to [place]

## 2024-02-28 NOTE — Telephone Encounter (Signed)
 Left message on voicemail- Rx sent to pharmacy for Trazodone  300 mg.

## 2024-02-28 NOTE — Progress Notes (Signed)
 Complex Care Management Note  Care Guide Note 02/28/2024 Name: Dorothy [name] MRN: [id number] DOB: 1956/07/07  Dorothy [name] is a 68 y.o. year old female who sees Lorren Greig PARAS, NP for primary care. I reached out to Koleen GORMAN Banter by phone today to offer complex care management services.  [name] was given information about Complex Care Management services today including:   The Complex Care Management services include support from the care team which includes your Nurse Care Manager, Clinical Social Worker, or Pharmacist.  The Complex Care Management team is here to help remove barriers to the health concerns and goals most important to you. Complex Care Management services are voluntary, and the patient may decline or stop services at any time by request to their care team member.   Complex Care Management Consent Status: Patient agreed to services and verbal consent obtained.   Follow up plan:  Telephone appointment with complex care management team member scheduled for:  7/8/ and 7/11  Encounter Outcome:  Patient Scheduled  Harlene Satterfield  [facility]  [facility], [facility] Guide  Direct Dial: [phone]  Fax [phone]

## 2024-02-29 ENCOUNTER — Other Ambulatory Visit: Payer: Self-pay

## 2024-02-29 NOTE — Patient Instructions (Signed)
 Visit Information  Thank you for taking time to visit with me today. Please don't hesitate to contact me if I can be of assistance to you before our next scheduled appointment.  Our next appointment is by telephone on 03/10/24 at 11 AM Please call the care guide team at [phone] if you need to cancel or reschedule your appointment.   Following is a copy of your care plan:   Goals Addressed             This Visit's Progress    VBCI RN Care Plan   On track    Problems:  Care Coordination needs related to Alcohol/Substance Use, Depression  , and Tobacco Use Chronic Disease Management support and education needs related to HLD, HTN, and chronic bronchitis  Goal: Over the next 30 days the Patient will demonstrate Improved health management independence as evidenced by making efforts to cut back on tobacco and alcohol use        take all medications exactly as prescribed and will call provider for medication related questions as evidenced by patient report of medication compliance    verbalize basic understanding of chronic bronchitis disease process and self health management plan as evidenced by patient verbalization of respiratory symptom triggers and management work with Child psychotherapist to address Financial constraints related to SDOH needs and Mental Health Concerns  related to the management of anxiety, depression, substance use as evidenced by review of electronic medical record and patient or social worker report     Schedule visit with pulmonology as evidenced by chart review  Interventions:   Evaluation of current treatment plan related to chronic bronchitis self-management and patient's adherence to plan as established by provider. Discussed plans with patient for ongoing care management follow up and provided patient with direct contact information for care management team Provided education to patient re: COPD symptoms and how it is diagnosed; deep breathing exercises and  importance of regular exercise, importance of tobacco and alcohol cessation, importance of infection prevention techniques Reviewed medications with patient and discussed importance of medication compliance Discussed plans with patient for ongoing care management follow up and provided patient with direct contact information for care management team Screening for signs and symptoms of depression related to chronic disease state  Assessed social determinant of health barriers  Hyperlipidemia Interventions: Medication review performed; medication list updated in electronic medical record.  Reviewed role and benefits of statin for ASCVD risk reduction Reviewed importance of limiting foods high in cholesterol Reviewed exercise goals and target of 150 minutes per week  Hypertension Interventions: Last practice recorded BP readings:  BP Readings from Last 3 Encounters:  02/23/24 136/89  11/22/23 (!) 158/94  09/08/23 (!) 168/97   Most recent eGFR/CrCl:  Lab Results  Component Value Date   EGFR 102 02/23/2024    No components found for: CRCL  Evaluation of current treatment plan related to hypertension self management and patient's adherence to plan as established by provider Reviewed medications with patient and discussed importance of compliance Counseled on the importance of exercise goals with target of 150 minutes per week Discussed complications of poorly controlled blood pressure such as heart disease, stroke, circulatory complications, vision complications, kidney impairment, sexual dysfunction  Smoking Cessation Interventions: Reviewed smoking history:  tobacco abuse of 50 years; currently smoking 2 ppd Previous quit attempts, unsuccessful using nicotine  patch and gum  Reports motivation to quit smoking  Advised patient to discuss smoking cessation options with provider Message sent to provider regarding options  for medications to assist with tobacco cessation  Patient  Self-Care Activities:  Attend all scheduled provider appointments Call provider office for new concerns or questions  Take medications as prescribed   Work with the social worker to address care coordination needs and will continue to work with the clinical team to address health care and disease management related needs begin an exercise program go to all doctor appointments as scheduled  Plan:  Telephone follow up appointment with care management team member scheduled for:  03/10/24 at 11 AM             Please call the Suicide and Crisis Lifeline: 988 call 1-800-273-TALK (toll free, 24 hour hotline) if you are experiencing a Mental Health or Behavioral Health Crisis or need someone to talk to.  Patient verbalizes understanding of instructions and care plan provided today and agrees to view in MyChart. Active MyChart status and patient understanding of how to access instructions and care plan via MyChart confirmed with patient.     Rosaline Finlay, RN MSN [place]  [facility] Population Health RN Care Manager Direct Dial: [phone]  Fax: [phone]  How to Do Breathing Exercises (Pursed Lip Breathing) Pursed lip breathing is a breathing exercise to help with the feeling of being short of breath. Pursed lip breathing keeps your airways open longer when you breathe out, or exhale, and it allows more air to leave your lungs. Pursed lip breathing may be helpful for long-term breathing problems such as chronic obstructive pulmonary disease (COPD) or asthma. It may also be helpful for: Pain, stress, or anxiety. Problems with swallowing or the vocal cords. Trouble breathing due to health problems like cancer. Pursed lip breathing helps to slow down your breathing and may help you be able to do more activities. Use the pursed lip technique during the hardest part of any activity, such as climbing stairs. How to do pursed lip breathing Before you start, take a minute to relax your  shoulders and close your eyes. Then: Start by closing your mouth. Breathe in through your nose, taking a normal breath. Do this at your normal rate of breathing. If you feel you're not getting enough air, breathe in while slowly counting to 2 or 3. Pucker or purse your lips, as if you're going to whistle. Gently tighten the muscles of your bellyor press on your belly. Breathe out slowly through your pursed lips. Take at least twice as long to breathe out as it takes you to breathe in. Make sure you breathe out all the air, but don't force air out. Ask your health care provider how often and how long to do this exercise. Follow these instructions at home: Take medicines only as told. Do not smoke, vape, or use nicotine  or tobacco. Ask your provider what activities are safe for you. Where to find more information To learn more: Go to [employer] at [place]. Click Search and type breathing exercises. Find the link you need. Contact a health care provider if: You have problems with this breathing exercise. Your shortness of breath gets worse. You find it harder to exercise or be active. You have a cough. You have a fever or chills. Get help right away if: You have extreme trouble breathing. You pass out or faint. These symptoms may be an emergency. Call 911 right away. Do not wait to see if the symptoms will go away. Do not drive yourself to the hospital. This information is not intended to replace advice given to  you by your health care provider. Make sure you discuss any questions you have with your health care provider. Document Revised: 08/05/2023 Document Reviewed: 08/05/2023 Elsevier Patient Education  2025 [employer].

## 2024-02-29 NOTE — Patient Outreach (Signed)
 Complex Care Management   Visit Note  02/29/2024  Name:  Dorothy [name] MRN: [id number] DOB: 1956/04/07  Situation: Referral received for Complex Care Management related to Substance Abuse/Misuse alcohol, tobacco and mental health concerns I obtained verbal consent from Patient.  Visit completed with Zera Guilliams  on the phone  Background:   Past Medical History:  Diagnosis Date   Alcoholic (HCC)    Chronic bronchitis (HCC)    Still smoking   Depression    GERD (gastroesophageal reflux disease)    Hoarseness 06/2010   Oral thrush---- Dr. Vaughan Ricker   Hypertension    Insomnia    Malignant melanoma (HCC) 2009   Removed from left shoulder    Assessment: Patient Reported Symptoms:  Cognitive Cognitive Status: Able to follow simple commands, Alert and oriented to person, place, and time, Normal speech and language skills Cognitive/Intellectual Conditions Management [RPT]: None reported or documented in medical history or problem list      Neurological Neurological Review of Symptoms: No symptoms reported    HEENT HEENT Symptoms Reported: No symptoms reported      Cardiovascular Cardiovascular Symptoms Reported: No symptoms reported Does patient have uncontrolled Hypertension?: No Cardiovascular Management Strategies: Medication therapy  Respiratory Respiratory Symptoms Reported: Dry cough, Shortness of breath, Other: (from smoking per patient, shortness of breath at rest) Other Respiratory Symptoms: shallow breathing Additional Respiratory Details: Patient reports using Albuterol  inhaler once a day. She has been smoking cigarettes for 50 years. She has tried to quit before using nicotine  patches and has not been successful. Reports she is unable to chew the gum because it sticks to her teeth. Reports she sits at home all day because she does not have a job and is unable to drive. She is concerned that she has COPD. Note that referral was sent to pulmonology. She has  not heard from them yet to schedule appointment. Respiratory Management Strategies: Medication therapy  Endocrine Endocrine Symptoms Reported: No symptoms reported Is patient diabetic?: No    Gastrointestinal Gastrointestinal Symptoms Reported: No symptoms reported Additional Gastrointestinal Details: Patient reports her appetite is good. Last BM today.      Genitourinary Genitourinary Symptoms Reported: No symptoms reported    Integumentary Integumentary Symptoms Reported: No symptoms reported    Musculoskeletal Musculoskelatal Symptoms Reviewed: No symptoms reported Musculoskeletal Comment: Patient does not do any regular exercise due to the heat Falls in the past year?: No Number of falls in past year: 1 or less Was there an injury with Fall?: No Fall Risk Category Calculator: 0 Patient Fall Risk Level: Low Fall Risk Patient at Risk for Falls Due to: No Fall Risks Fall risk Follow up: Falls evaluation completed  Psychosocial Psychosocial Symptoms Reported: Anxiety - if selected complete GAD, Depression - if selected complete PHQ 2-9, Substance use Behavioral Management Strategies: Abstinence from substances Behavioral Health Comment: Patient declines counseling at this time. She previously tried AA but did not like it. She is trying to cut back on alcohol use. Patient is asking about medication to help with tobacco cessation. 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  Medications Reviewed Today     Reviewed by Arno Rosaline SQUIBB, RN (Registered Nurse) on 02/29/24 at 1154  Med List Status: <None>   Medication Order Taking? Sig Documenting Provider Last Dose Status Informant  albuterol  (VENTOLIN  HFA) 108 (90 Base) MCG/ACT inhaler [id number] Yes Inhale 2 puffs into the lungs every 6 (six) hours as needed for wheezing or shortness of breath. Lorren Greig PARAS, NP  Active   amLODipine  (NORVASC ) 10 MG tablet [id number] Yes Take 1 tablet (10 mg total) by mouth daily. Lorren Greig PARAS, NP  Active   aspirin  EC 81 MG tablet [id number]  Take 1 tablet (81 mg total) by mouth daily. Swallow whole.  Patient not taking: Reported on 02/29/2024   Cam Charlie Loving, DO  Active   atorvastatin  (LIPITOR ) 80 MG tablet [id number] Yes Take 1 tablet (80 mg total) by mouth at bedtime. Lorren Greig PARAS, NP  Active   FLUoxetine  (PROZAC ) 10 MG capsule [id number] Yes Take 1 capsule (10 mg total) by mouth daily. Lorren Greig PARAS, NP  Active   risperiDONE  (RISPERDAL ) 1 MG tablet [id number]  Take 1 tablet (1 mg total) by mouth at bedtime.  Patient not taking: Reported on 02/29/2024   Cam Charlie Loving, DO  Active   traZODone  (DESYREL ) 300 MG tablet [id number] Yes Take 1 tablet (300 mg total) by mouth at bedtime. Lorren Greig PARAS, NP  Active             Recommendation:   Specialty provider follow-up pulmonology to be scheduled Continue Current Plan of Care [facility] will message primary provider regarding tobacco cessation medication  Follow Up Plan:   Telephone follow up appointment date/time:  02/09/24 at 11 AM Patient is scheduled with LCSW 03/03/24 at 9 AM  Rosaline Arno, RN MSN [place]  [facility] RN Care Manager Direct Dial: [phone]  Fax: [phone]

## 2024-03-03 ENCOUNTER — Telehealth: Payer: Self-pay | Admitting: Licensed Clinical Social Worker

## 2024-03-03 ENCOUNTER — Encounter: Payer: Self-pay | Admitting: Licensed Clinical Social Worker

## 2024-03-07 ENCOUNTER — Telehealth: Payer: Self-pay | Admitting: *Deleted

## 2024-03-07 NOTE — Progress Notes (Unsigned)
 Complex Care Management Care Guide Note  03/07/2024 Name: Dorothy [name] MRN: [id number] DOB: 05/17/1956  Dorothy [name] is a 68 y.o. year old female who is a primary care patient of Lorren Greig PARAS, NP and is actively engaged with the care management team. I reached out to Koleen GORMAN Banter by phone today to assist with re-scheduling  with the Licensed Clinical Child psychotherapist.  Follow up plan: Unsuccessful telephone outreach attempt made. A HIPAA compliant phone message was left for the patient providing contact information and requesting a return call.  Harlene Satterfield  [facility]  [facility] [place] [facility] Guide  Direct Dial: [phone]  Fax [phone]

## 2024-03-09 NOTE — Progress Notes (Signed)
 Complex Care Management Care Guide Note  03/09/2024 Name: Dorothy [name] MRN: [id number] DOB: [birth year]  Dorothy [name] is a 68 y.o. year old female who is a primary care patient of Lorren Greig PARAS, NP and is actively engaged with the care management team. I reached out to Koleen GORMAN Banter by phone today to assist with re-scheduling  with the Licensed Clinical Child psychotherapist.  Follow up plan: Telephone appointment with complex care management team member scheduled for:  7/30  Harlene Satterfield  [facility]  [facility] Guide  Direct Dial: [phone]  Fax [phone]

## 2024-03-10 ENCOUNTER — Other Ambulatory Visit: Payer: Self-pay

## 2024-03-10 ENCOUNTER — Ambulatory Visit: Payer: Self-pay

## 2024-03-10 NOTE — Telephone Encounter (Signed)
 Patient called and given /schedule appt

## 2024-03-10 NOTE — Patient Outreach (Signed)
 Complex Care Management   Visit Note  03/10/2024  Name:  Dorothy [name] MRN: [id number] DOB: February 18, 1956  Situation: Referral received for Complex Care Management related to Substance Abuse/Misuse alcohol, tobacco and mental health concerns I obtained verbal consent from Patient.  Visit completed with Kylene Cayson  on the phone  Background:   Past Medical History:  Diagnosis Date   Alcoholic (HCC)    Chronic bronchitis (HCC)    Still smoking   Depression    GERD (gastroesophageal reflux disease)    Hoarseness 06/2010   Oral thrush---- Dr. Vaughan Ricker   Hypertension    Insomnia    Malignant melanoma (HCC) 2009   Removed from left shoulder    Assessment: Patient Reported Symptoms:  Cognitive Cognitive Status: Able to follow simple commands, Alert and oriented to person, place, and time, Normal speech and language skills Cognitive/Intellectual Conditions Management [RPT]: None reported or documented in medical history or problem list      Neurological Neurological Review of Symptoms: Other: [place] Neurological Symptoms/Conditions [RPT]: Patient reports she is concerned about her memory. She has noticed that her memory has gotten worse over time. She is concerned that her history of alcohol is contributing to issues with memory. Neurological Management Strategies: Routine screening Neurological Comment: Advised patient to set up an appointment with PCP for memory evaluation and to discuss treatment.  HEENT HEENT Symptoms Reported: Not assessed      Cardiovascular Cardiovascular Symptoms Reported: Not assessed    Respiratory Respiratory Symptoms Reported: Dry cough, Shortness of breath Additional Respiratory Details: Patient has not heard from pulmonology to schedule visit. She continues to use Albuterol  inhaler once a day. Provided patient with pulmonology office number to call and schedule appointment. Respiratory Management Strategies: Medication therapy  Endocrine  Endocrine Symptoms Reported: Not assessed    Gastrointestinal Gastrointestinal Symptoms Reported: Not assessed      Genitourinary Genitourinary Symptoms Reported: Not assessed    Integumentary Integumentary Symptoms Reported: Not assessed    Musculoskeletal Musculoskelatal Symptoms Reviewed: Not assessed        Psychosocial Psychosocial Symptoms Reported: Anxiety - if selected complete GAD, Depression - if selected complete PHQ 2-9 Additional Psychological Details: Patient states I'm up and down when discussing her anxiety/depression. She notes that she has stopped taking her Fluoxetine .  Reviewed importance of taking this medication as ordered and not stopping suddently. Discussed obtaining a pill box to help with medication organization and compliance.   Major Change/Loss/Stressor/Fears (CP): Environment, Medical condition, self Techniques to Cope with Loss/Stress/Change: Medication, Substance use        03/10/2024   11:14 AM  Depression screen PHQ 2/9  Decreased Interest 0  Down, Depressed, Hopeless 3  PHQ - 2 Score 3  Altered sleeping 3  Tired, decreased energy 3  Change in appetite 0  Feeling bad or failure about yourself  1  Trouble concentrating 0  Moving slowly or fidgety/restless 0  Suicidal thoughts 1  PHQ-9 Score 11    There were no vitals filed for this visit.  Medications Reviewed Today     Reviewed by Arno Rosaline SQUIBB, RN (Registered Nurse) on 03/10/24 at 1117  Med List Status: <None>   Medication Order Taking? Sig Documenting Provider Last Dose Status Informant  albuterol  (VENTOLIN  HFA) 108 (90 Base) MCG/ACT inhaler [id number]  Inhale 2 puffs into the lungs every 6 (six) hours as needed for wheezing or shortness of breath. Lorren Greig PARAS, NP  Active   amLODipine  (NORVASC ) 10 MG tablet [id number]  Take 1 tablet (10 mg total) by mouth daily.  Patient not taking: Reported on 03/10/2024   Lorren Greig PARAS, NP  Active   aspirin  EC 81 MG tablet [id number]   Take 1 tablet (81 mg total) by mouth daily. Swallow whole.  Patient not taking: Reported on 03/10/2024   Cam Charlie Loving, DO  Active   atorvastatin  (LIPITOR ) 80 MG tablet [id number] Yes Take 1 tablet (80 mg total) by mouth at bedtime. Lorren Greig PARAS, NP  Active   FLUoxetine  (PROZAC ) 10 MG capsule [id number]  Take 1 capsule (10 mg total) by mouth daily.  Patient not taking: Reported on 03/10/2024   Lorren Greig PARAS, NP  Active   risperiDONE  (RISPERDAL ) 1 MG tablet [id number]  Take 1 tablet (1 mg total) by mouth at bedtime.  Patient not taking: Reported on 03/10/2024   Cam Charlie Loving, DO  Active   traZODone  (DESYREL ) 300 MG tablet [id number] Yes Take 1 tablet (300 mg total) by mouth at bedtime. Lorren Greig PARAS, NP  Active             Recommendation:   Acute PCP follow-up call PCP office to schedule an appointment for memory concerns/evaluation. Specialty provider follow-up call pulmonology office to schedule visit Continue Current Plan of Care  Follow Up Plan:   Telephone follow up appointment date/time:  03/24/24 at 11 AM  Rosaline Finlay, RN MSN [place]  [facility] RN Care Manager Direct Dial: [phone]  Fax: [phone]

## 2024-03-10 NOTE — Patient Instructions (Signed)
 Visit Information  Thank you for taking time to visit with me today. Please don't hesitate to contact me if I can be of assistance to you before our next scheduled appointment.  Your next care management appointment is by telephone on 03/24/24 at 11 AM  Please call PCP office to schedule an appointment for your memory concerns. Please call [facility] at [phone] to schedule an initial evaluation.  Please call the care guide team at [phone] if you need to cancel, schedule, or reschedule an appointment.   Please call the Suicide and Crisis Lifeline: 988 call 1-800-273-TALK (toll free, 24 hour hotline) if you are experiencing a Mental Health or Behavioral Health Crisis or need someone to talk to.  Rosaline Finlay, RN MSN [place]  [facility] Population Health RN Care Manager Direct Dial: [phone]  Fax: [phone]

## 2024-03-10 NOTE — Telephone Encounter (Signed)
 FYI Only or Action Required?: Action required by provider: clinical question for provider.  Patient was last seen in primary care on 02/23/2024 by Lorren Greig PARAS, NP.  Called Nurse Triage reporting Memory Loss.  Symptoms began several weeks ago.  Interventions attempted: Prescription medications: Trazodone .  Symptoms are: gradually worsening.  Triage Disposition: See PCP Within 2 Weeks- No avail appts with this time frame. Went ahead and scheduled, placed on waiting list. Patient says that meanwhile, she will start taking her Trazadone earlier and see if that helps her sleep better and longer and see if that helps with her brain fog.   Patient/caregiver understands and will follow disposition?: Yes    Copied from CRM [id number]. 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 Pt believes her brain is not functioning as it should be. Pt also met with complex care management today and they advised her to schedule an appointment Reason for Disposition  New or worsening memory (forgetfulness) problems  Answer Assessment - Initial Assessment Questions 1. MAIN CONCERN OR SYMPTOM:  What is your main concern right now? What questions do you have? What's the main symptom you're worried about? (e.g., confusion, memory loss)     My brain is not working right anymore, I am forgetting everything and it's concerning  2. ONSET:  When did the symptom start (or worsen)? (minutes, hours, days, weeks)     Unsure of when symptoms started  3. BETTER-SAME-WORSE: Are you (the patient) getting better, staying the same, or getting worse compared to the day you (they) were diagnosed or most recent hospital discharge?     Getting worse over the last couple of weekns  4. DIAGNOSIS: Was the dementia diagnosed by a doctor? If Yes, ask: When? (e.g., days, months, years ago)     No  5. MEDICINES: Has there been any  change in medicines recently? (e.g., narcotics, antihistamines, benzodiazepines, etc.)     No  6. OTHER SYMPTOMS: Are there any other symptoms? (e.g., cough, falling, fever, pain)     Insomnia, not able to fall asleep. Getting about 6 hours each day  7. SUPPORT: What type of support do you (the patient) have? Note: Document living circumstances and support (e.g., family, nursing home).     Not much support. Parents live in another town but goes out once a week with father.  Protocols used: Dementia Symptoms and Questions-A-AH

## 2024-03-13 ENCOUNTER — Ambulatory Visit (INDEPENDENT_AMBULATORY_CARE_PROVIDER_SITE_OTHER): Admitting: Family

## 2024-03-13 ENCOUNTER — Encounter: Payer: Self-pay | Admitting: Family

## 2024-03-13 VITALS — BP 177/102 | HR 85 | Temp 97.9°F | Resp 18 | Ht 66.0 in | Wt 137.0 lb

## 2024-03-13 DIAGNOSIS — R4189 Other symptoms and signs involving cognitive functions and awareness: Secondary | ICD-10-CM

## 2024-03-13 DIAGNOSIS — I10 Essential (primary) hypertension: Secondary | ICD-10-CM

## 2024-03-13 DIAGNOSIS — R413 Other amnesia: Secondary | ICD-10-CM

## 2024-03-13 DIAGNOSIS — T63304A Toxic effect of unspecified spider venom, undetermined, initial encounter: Secondary | ICD-10-CM

## 2024-03-13 MED ORDER — VALSARTAN 40 MG PO TABS: 40.0000 mg | ORAL_TABLET | Freq: Every day | ORAL | 0 refills | Status: DC

## 2024-03-13 NOTE — Progress Notes (Signed)
 Patient ID: Dorothy [name], female    DOB: [birth year]  MRN: [id number]  CC: Memory Issues/Brain Fog  Subjective: Dorothy [name] is a 68 y.o. female who presents for memory issues/brain fog.   Her concerns today include:  - States memory issues and brain fog. States sometimes she forgets how to look for battery charge on her phone and forgets to lock her house door. States sometimes she considers if her history of alcohol consumption affects memory.  - Doing well on Amlodipine , no issues/concerns. States she forgot to take Amlodipine  this morning. She does not complain of red flag symptoms such as but not limited to chest pain, shortness of breath, worst headache of life, nausea/vomiting.  - States she has spiders all over her house and told her landlord. States a spider bit her on the right forearm and has improved since began. Denies red flag symptoms.   Patient Active Problem List   Diagnosis Date Noted   MDD (major depressive disorder), recurrent severe, without psychosis (HCC) 09/15/2022   Anxiety and depression 11/14/2021   Insomnia 11/14/2021   Hyperlipidemia 10/16/2021   Amaurosis fugax of right eye    Smoker    TIA (transient ischemic attack) 10/15/2021   HTN (hypertension) 10/15/2021   Chronic bronchitis (HCC) 10/15/2021   Alcohol-induced mood disorder (HCC) 08/28/2019   MDD (major depressive disorder), recurrent episode, moderate (HCC) 08/28/2019   Severe recurrent major depression without psychotic features (HCC) 06/08/2018   Alcohol use disorder, severe, dependence (HCC) 06/08/2018     Current Outpatient Medications on File Prior to Visit  Medication Sig Dispense Refill   albuterol  (VENTOLIN  HFA) 108 (90 Base) MCG/ACT inhaler Inhale 2 puffs into the lungs every 6 (six) hours as needed for wheezing or shortness of breath. 18 g 2   atorvastatin  (LIPITOR ) 80 MG tablet Take 1 tablet (80 mg total) by mouth at bedtime. 90 tablet 0   traZODone  (DESYREL ) 300 MG  tablet Take 1 tablet (300 mg total) by mouth at bedtime. 90 tablet 0   amLODipine  (NORVASC ) 10 MG tablet Take 1 tablet (10 mg total) by mouth daily. (Patient not taking: Reported on 03/10/2024) 90 tablet 0   aspirin  EC 81 MG tablet Take 1 tablet (81 mg total) by mouth daily. Swallow whole. (Patient not taking: Reported on 03/10/2024) 30 tablet 12   FLUoxetine  (PROZAC ) 10 MG capsule Take 1 capsule (10 mg total) by mouth daily. (Patient not taking: Reported on 03/10/2024) 90 capsule 0   risperiDONE  (RISPERDAL ) 1 MG tablet Take 1 tablet (1 mg total) by mouth at bedtime. (Patient not taking: Reported on 03/10/2024) 30 tablet 3   No current facility-administered medications on file prior to visit.    No Known Allergies  Social History   Socioeconomic History   Marital status: Divorced    Spouse name: Not on file   Number of children: Not on file   Years of education: Not on file   Highest education level: Not on file  Occupational History   Not on file  Tobacco Use   Smoking status: Every Day    Current packs/day: 1.50    Average packs/day: 1.5 packs/day for 51.6 years (77.3 ttl pk-yrs)    Types: Cigarettes    Start date: 08/24/1972    Passive exposure: Current   Smokeless tobacco: Never  Vaping Use   Vaping status: Never Used  Substance and Sexual Activity   Alcohol use: Not Currently    Comment: >1 pint vodka daily and wine  Drug use: Yes    Types: Marijuana   Sexual activity: Not on file  Other Topics Concern   Not on file  Social History Narrative   Not on file   Social Drivers of Health   Financial Resource Strain: Low Risk  (03/13/2024)   Overall Financial Resource Strain (CARDIA)    Difficulty of Paying Living Expenses: Not hard at all  Food Insecurity: No Food Insecurity (02/29/2024)   Hunger Vital Sign    Worried About Running Out of Food in the Last Year: Never true    Ran Out of Food in the Last Year: Never true  Transportation Needs: No Transportation Needs  (02/29/2024)   PRAPARE - Administrator, Civil Service (Medical): No    Lack of Transportation (Non-Medical): No  Physical Activity: Inactive (03/13/2024)   Exercise Vital Sign    Days of Exercise per Week: 0 days    Minutes of Exercise per Session: 0 min  Stress: No Stress Concern Present (03/13/2024)   [employer] of Occupational Health - Occupational Stress Questionnaire    Feeling of Stress: Only a little  Social Connections: Socially Isolated (02/23/2024)   Social Connection and Isolation Panel    Frequency of Communication with Friends and Family: Once a week    Frequency of Social Gatherings with Friends and Family: Never    Attends Religious Services: More than 4 times per year    Active Member of [employer] or Organizations: No    Attends Banker Meetings: Never    Marital Status: Never married  Intimate Partner Violence: Not At Risk (02/29/2024)   Humiliation, Afraid, Rape, and Kick questionnaire    Fear of Current or Ex-Partner: No    Emotionally Abused: No    Physically Abused: No    Sexually Abused: No    Family History  Problem Relation Age of Onset   Cirrhosis Father     Past Surgical History:  Procedure Laterality Date   BASAL CELL CARCINOMA EXCISION  2012   Right side nose    BREAST ENHANCEMENT SURGERY  2002   ELBOW SURGERY     Right elbow surgery---Ruptured tendon    ROS: Review of Systems Negative except as stated above  PHYSICAL EXAM: BP (!) 177/102   Pulse 85   Temp 97.9 F (36.6 C) (Oral)   Resp 18   Ht 5' 6 (1.676 m)   Wt 137 lb (62.1 kg)   SpO2 93%   BMI 22.11 kg/m   Physical Exam HENT:     Head: Normocephalic and atraumatic.     Nose: Nose normal.     Mouth/Throat:     Mouth: Mucous membranes are moist.     Pharynx: Oropharynx is clear.  Eyes:     Extraocular Movements: Extraocular movements intact.     Conjunctiva/sclera: Conjunctivae normal.     Pupils: Pupils are equal, round, and reactive to light.   Cardiovascular:     Rate and Rhythm: Normal rate and regular rhythm.     Pulses: Normal pulses.     Heart sounds: Normal heart sounds.  Pulmonary:     Effort: Pulmonary effort is normal.     Breath sounds: Normal breath sounds.  Musculoskeletal:        General: Normal range of motion.     Cervical back: Normal range of motion and neck supple.  Skin:    General: Skin is warm and dry.     Comments: Pinpoint erythematous area of right  forearm, no drainage.  Neurological:     General: No focal deficit present.     Mental Status: She is alert and oriented to person, place, and time.  Psychiatric:        Mood and Affect: Mood normal.        Behavior: Behavior normal.     ASSESSMENT AND PLAN: 1. Memory loss (Primary) 2. Brain fog - Referral to Neurology for evaluation/management. - Ambulatory referral to Neurology  3. Primary hypertension - Blood pressure not at goal during today's visit. Patient asymptomatic without chest pressure, chest pain, palpitations, shortness of breath, worst headache of life, and any additional red flag symptoms. - Patient declined Hydralazine  to be administered today in office.  - Continue Amlodipine  as prescribed. No refills needed as of present.  - Trial Valsartan  as prescribed.  - Counseled on blood pressure goal of less than 130/80, low-sodium, DASH diet, medication compliance, and 150 minutes of moderate intensity exercise per week as tolerated. Counseled on medication adherence and adverse effects. - Follow-up with primary provider in 4 weeks or sooner if needed.  - valsartan  (DIOVAN ) 40 MG tablet; Take 1 tablet (40 mg total) by mouth daily.  Dispense: 90 tablet; Refill: 0  4. Spider bite wound, undetermined intent, initial encounter - Patient declined pharmacological therapy/further workup.     Patient was given the opportunity to ask questions.  Patient verbalized understanding of the plan and was able to repeat key elements of the plan.  Patient was given clear instructions to go to Emergency Department or return to medical center if symptoms don't improve, worsen, or new problems develop.The patient verbalized understanding.   Orders Placed This Encounter  Procedures   Ambulatory referral to Neurology     Requested Prescriptions   Signed Prescriptions Disp Refills   valsartan  (DIOVAN ) 40 MG tablet 90 tablet 0    Sig: Take 1 tablet (40 mg total) by mouth daily.    Follow-up with primary provider as scheduled.  Greig JINNY Drones, NP

## 2024-03-13 NOTE — Progress Notes (Signed)
 Memory issues, brain fog, and thinks she has a spider bite

## 2024-03-22 ENCOUNTER — Encounter: Payer: Self-pay | Admitting: Licensed Clinical Social Worker

## 2024-03-22 ENCOUNTER — Telehealth: Payer: Self-pay | Admitting: Licensed Clinical Social Worker

## 2024-03-22 NOTE — Patient Instructions (Signed)
 Amandeep S Lazaro - I am sorry I was unable to reach you today for our scheduled appointment. I work with Lorren Greig PARAS, NP and am calling to support your healthcare needs. Please contact me at [phone] at your earliest convenience. I look forward to speaking with you soon.   Thank you,  Rolin Kerns, LCSW [place]  [facility], [facility] Clinical Social Worker Direct Dial: [phone]  Fax: [phone] Website: [email or web address] 3:25 PM

## 2024-03-24 ENCOUNTER — Other Ambulatory Visit: Payer: Self-pay

## 2024-03-24 NOTE — Patient Instructions (Signed)
 Visit Information  Thank you for taking time to visit with me today. Please don't hesitate to contact me if I can be of assistance to you before our next scheduled appointment.  Your next care management appointment is by telephone on 04/07/24 at 11 AM  Please call the care guide team at [phone] if you need to cancel, schedule, or reschedule an appointment.   Please call the Suicide and Crisis Lifeline: 988 call 1-800-273-TALK (toll free, 24 hour hotline) if you are experiencing a Mental Health or Behavioral Health Crisis or need someone to talk to.  Rosaline Finlay, RN MSN [place]  [facility] Population Health RN Care Manager Direct Dial: [phone]  Fax: [phone]

## 2024-03-24 NOTE — Patient Outreach (Signed)
 Complex Care Management   Visit Note  03/24/2024  Name:  Dorothy [name] MRN: [id number] DOB: April 05, 1956  Situation: Referral received for Complex Care Management related to Substance Abuse/Misuse alcohol, tobacco and mental health concerns I obtained verbal consent from Patient.  Visit completed with Joe Benedict  on the phone  Background:   Past Medical History:  Diagnosis Date   Alcoholic (HCC)    Chronic bronchitis (HCC)    Still smoking   Depression    GERD (gastroesophageal reflux disease)    Hoarseness 06/2010   Oral thrush---- Dr. Vaughan Ricker   Hypertension    Insomnia    Malignant melanoma (HCC) 2009   Removed from left shoulder    Assessment: Patient Reported Symptoms:  Cognitive Cognitive Status: Able to follow simple commands, Alert and oriented to person, place, and time, Normal speech and language skills Cognitive/Intellectual Conditions Management [RPT]: None reported or documented in medical history or problem list      Neurological Neurological Review of Symptoms: Other: [place] Neurological Symptoms/Conditions [RPT]: Patient reported at previous visit that she was having concerns about her memory. She did schedule a visit with PCP for evaluation. Neurological Management Strategies: Routine screening Neurological Comment: Note that referral was placed for neuro after PCP visit. Per chart review referral was sent to [facility]. Provided patient with phone number to call and schedule appointment. [phone]  HEENT HEENT Symptoms Reported: Not assessed      Cardiovascular Cardiovascular Symptoms Reported: Not assessed    Respiratory Respiratory Symptoms Reported: Dry cough, Shortness of breath Additional Respiratory Details: Note that patient has visit scheduled with pulmonology 05/08/24 Respiratory Management Strategies: Medication therapy  Endocrine Endocrine Symptoms Reported: Not assessed    Gastrointestinal Gastrointestinal  Symptoms Reported: Not assessed      Genitourinary Genitourinary Symptoms Reported: Not assessed    Integumentary Integumentary Symptoms Reported: Not assessed    Musculoskeletal Musculoskelatal Symptoms Reviewed: Not assessed        Psychosocial Psychosocial Symptoms Reported: Other Other Psychosocial Conditions: Patient reports her mental health has been up and down related to not having a job, not being able to drive, and feeling like she is stuck at home all day. Note that she has missed 2 calls from LCSW. Rescheduled 03/29/24 and provided with LCSW phone number to put in her phone so that her phone does not screen the call. Behavioral Management Strategies: Abstinence from substances Major Change/Loss/Stressor/Fears (CP): Environment, Medical condition, self Techniques to Cope with Loss/Stress/Change: Medication, Substance use        03/13/2024    9:47 AM  Depression screen PHQ 2/9  Decreased Interest 0  Down, Depressed, Hopeless 1  PHQ - 2 Score 1  Altered sleeping 1  Tired, decreased energy 1  Change in appetite 0  Feeling bad or failure about yourself  1  Trouble concentrating 0  Moving slowly or fidgety/restless 0  Suicidal thoughts 1  PHQ-9 Score 5  Difficult doing work/chores Not difficult at all    There were no vitals filed for this visit.  Medications Reviewed Today   Medications were not reviewed in this encounter     Recommendation:   Continue Current Plan of Care Patient will call neurology office to schedule appointment for memory evaluation  Follow Up Plan:   Telephone follow up appointment date/time:  04/07/24 at 11 AM  Rosaline Finlay, RN MSN [place]  [facility] RN Care Manager Direct Dial: [phone]  Fax: [phone]

## 2024-03-29 ENCOUNTER — Telehealth: Payer: Self-pay | Admitting: Licensed Clinical Social Worker

## 2024-03-29 ENCOUNTER — Encounter: Payer: Self-pay | Admitting: Licensed Clinical Social Worker

## 2024-03-29 NOTE — Patient Instructions (Signed)
 Dorothy [name] - I am sorry I was unable to reach you today for our scheduled appointment. I work with Lorren Greig PARAS, NP and am calling to support your healthcare needs. Please contact me at [phone] at your earliest convenience. I look forward to speaking with you soon.   Thank you,  Rolin Kerns, LCSW [place]  [facility], [facility] Clinical Social Worker Direct Dial: [phone]  Fax: [phone] Website: [email or web address] 10:15 AM

## 2024-04-07 ENCOUNTER — Telehealth

## 2024-04-07 NOTE — Patient Outreach (Signed)
 Care Coordination   04/07/2024 Name: Dorothy [name] MRN: [id number] DOB: 1956-01-04   Care Coordination Outreach Attempts:  An unsuccessful outreach was attempted for an appointment today.  Follow Up Plan:  Additional outreach attempts will be made to complete CCM follow-up visit.   Encounter Outcome:  Call placed to patient at scheduled appointment time. Patient Request to Call Back after 2 PM. Call placed to patient after 2 PM, no answer. HIPAA compliant voicemail left requesting return call.   Rosaline Finlay, RN MSN [place]  [facility] Population Health RN Care Manager Direct Dial: [phone]  Fax: [phone]

## 2024-04-12 ENCOUNTER — Ambulatory Visit: Admitting: Family

## 2024-04-13 NOTE — Patient Outreach (Signed)
 Care Coordination   04/13/2024 Name: Dorothy [name] MRN: [id number] DOB: 1955-10-27   Care Coordination Outreach Attempts:  A second unsuccessful outreach was attempted today to complete CCM follow-up visit.  Follow Up Plan:  Additional outreach attempts will be made to complete follow-up visit.   Encounter Outcome:  No Answer. HIPAA compliant voicemail left requesting return call.   Rosaline Finlay, RN MSN [place]  [facility]BC[facility] Population Health RN Care Manager Direct Dial: [phone]  Fax: [phone]

## 2024-04-14 NOTE — Patient Outreach (Signed)
 Care Coordination   04/14/2024 Name: Dorothy [name] MRN: [id number] DOB: 02/09/1956   Care Coordination Outreach Attempts:  A third unsuccessful outreach as attempted today to complete CCM follow-up visit.  Follow Up Plan:  No further outreach attempts will be made at this time. We have been unable to contact the patient to complete follow-up visit. Patient has been unenrolled from CCM.  Encounter Outcome:  No Answer. HIPAA compliant voicemail left requesting return call.   Rosaline Finlay, RN MSN [place]  [facility] Population Health RN Care Manager Direct Dial: [phone]  Fax: [phone]

## 2024-05-08 ENCOUNTER — Encounter: Payer: Self-pay | Admitting: Pulmonary Disease

## 2024-05-08 ENCOUNTER — Ambulatory Visit: Admitting: Pulmonary Disease

## 2024-05-19 ENCOUNTER — Ambulatory Visit (HOSPITAL_COMMUNITY)
Admission: EM | Admit: 2024-05-19 | Discharge: 2024-05-20 | Disposition: A | Discharge status: Other Acute Inpatient Hospital | Payer: Medicare (Managed Care)
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
## 2024-05-20 NOTE — ED Notes (Signed)
 Report called to Charge Nurse Abby, [facility].  Librarian, academic.
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
## 2024-05-20 NOTE — ED Notes (Signed)
 Upon arrival to [facility] pt refusing to come into building for treatment. Pt is voluntary at this time & EDP aware. EDP unable to assess pt since she is refusing to come into the building. Pt got an uber ride home.

## 2024-06-07 ENCOUNTER — Telehealth: Payer: Self-pay | Admitting: Family

## 2024-06-07 ENCOUNTER — Encounter: Admitting: Family

## 2024-06-07 NOTE — Telephone Encounter (Signed)
 Called pt and left vm to call office back to reschedule missed appt

## 2024-06-07 NOTE — Progress Notes (Signed)
 Erroneous encounter-disregard

## 2024-06-22 ENCOUNTER — Telehealth: Payer: Self-pay | Admitting: Family

## 2024-06-22 NOTE — Telephone Encounter (Signed)
 Copied from CRM #[id number]. Topic: Referral - Request for Referral >> Jun 22, 2024  1:22 PM Hadassah PARAS wrote: Did the patient discuss referral with their provider in the last year? No (If No - schedule appointment) (If Yes - send message)  Appointment offered? No  Type of order/referral and detailed reason for visit: Pt is requesting to see an ENT to get ears cleaned out (ear wax). Prohibited pt from hearing. Pt contacted the office and they stated they need a referral to be seen  Preference of office, provider, location: [facility] Moccasin, MD  If referral order, have you been seen by this specialty before? Yes (If Yes, this issue or another issue? When? Where?  Can we respond through MyChart? No

## 2024-06-23 ENCOUNTER — Other Ambulatory Visit: Payer: Self-pay | Admitting: Family

## 2024-06-23 ENCOUNTER — Telehealth: Payer: Self-pay | Admitting: Family

## 2024-06-23 DIAGNOSIS — H6123 Impacted cerumen, bilateral: Secondary | ICD-10-CM

## 2024-06-23 NOTE — Telephone Encounter (Signed)
 Complete

## 2024-06-23 NOTE — Telephone Encounter (Signed)
 Copied from CRM [id number]. Topic: Referral - Request for Referral >> Jun 23, 2024 10:28 AM Larissa RAMAN wrote: Did the patient discuss referral with their provider in the last year? Yes (If No - schedule appointment) (If Yes - send message)  Appointment offered? No  Type of order/referral and detailed reason for visit: Dermatology, [facility]   Preference of office, provider, location: [facility]  If referral order, have you been seen by this specialty before? Yes (If Yes, this issue or another issue? When? Where?  Can we respond through MyChart? No

## 2024-06-26 NOTE — Telephone Encounter (Signed)
 I called patient and made her aware that pcp put in a referral

## 2024-06-27 ENCOUNTER — Other Ambulatory Visit: Payer: Self-pay | Admitting: Family

## 2024-06-27 DIAGNOSIS — R21 Rash and other nonspecific skin eruption: Secondary | ICD-10-CM

## 2024-06-27 NOTE — Telephone Encounter (Signed)
 Complete

## 2024-06-27 NOTE — Telephone Encounter (Signed)
 I called patient to make her aware pcp sent in referral and patient stated she has already made that appointment

## 2024-07-13 ENCOUNTER — Institutional Professional Consult (permissible substitution) (INDEPENDENT_AMBULATORY_CARE_PROVIDER_SITE_OTHER)

## 2024-08-07 ENCOUNTER — Ambulatory Visit: Payer: Self-pay | Admitting: Family

## 2024-08-07 ENCOUNTER — Encounter: Payer: Self-pay | Admitting: Family

## 2024-08-07 VITALS — BP 115/79 | HR 79 | Temp 98.6°F | Resp 18 | Ht 66.0 in | Wt 136.0 lb

## 2024-08-07 DIAGNOSIS — G4700 Insomnia, unspecified: Secondary | ICD-10-CM

## 2024-08-07 DIAGNOSIS — F419 Anxiety disorder, unspecified: Secondary | ICD-10-CM

## 2024-08-07 DIAGNOSIS — I10 Essential (primary) hypertension: Secondary | ICD-10-CM

## 2024-08-07 DIAGNOSIS — F32A Depression, unspecified: Secondary | ICD-10-CM

## 2024-08-07 DIAGNOSIS — E785 Hyperlipidemia, unspecified: Secondary | ICD-10-CM

## 2024-08-07 DIAGNOSIS — R059 Cough, unspecified: Secondary | ICD-10-CM

## 2024-08-07 MED ORDER — VALSARTAN 40 MG PO TABS: 40.0000 mg | ORAL_TABLET | Freq: Every day | ORAL | 0 refills | Status: AC

## 2024-08-07 MED ORDER — ALBUTEROL SULFATE HFA 108 (90 BASE) MCG/ACT IN AERS: 2.0000 | INHALATION_SPRAY | Freq: Four times a day (QID) | RESPIRATORY_TRACT | 2 refills | Status: AC | PRN

## 2024-08-07 MED ORDER — ATORVASTATIN CALCIUM 80 MG PO TABS: 80.0000 mg | ORAL_TABLET | Freq: Every day | ORAL | 0 refills | Status: AC

## 2024-08-07 MED ORDER — TRAZODONE HCL 300 MG PO TABS: 300.0000 mg | ORAL_TABLET | Freq: Every day | ORAL | 0 refills | Status: AC

## 2024-08-07 MED ORDER — AMLODIPINE BESYLATE 10 MG PO TABS: 10.0000 mg | ORAL_TABLET | Freq: Every day | ORAL | 0 refills | Status: AC

## 2024-08-07 NOTE — Progress Notes (Signed)
 Patient ID: Dorothy [name], female    DOB: 08-07-1956  MRN: [id number]  CC: Chronic Conditions Follow-Up  Subjective: Dorothy [name] is a 68 y.o. female who presents for chronic conditions follow-up.   Her concerns today include:  - Doing well on Amlodipine  and Valsartan , no issues/concerns. She does not complain of red flag symptoms such as but not limited to chest pain, shortness of breath, worst headache of life, nausea/vomiting.  - Doing well on Atorvastatin , no issues/concerns.  - States she is not taking her anxiety depression medication per her preference. Established with Psychiatry. She denies thoughts of self-harm, suicidal ideations, homicidal ideations. - Doing well on Trazodone , no issues/concerns.  - Doing well on Albuterol  inhaler, no issues/concerns.   Patient Active Problem List   Diagnosis Date Noted   MDD (major depressive disorder), recurrent severe, without psychosis (HCC) 09/15/2022   Anxiety and depression 11/14/2021   Insomnia 11/14/2021   Hyperlipidemia 10/16/2021   Amaurosis fugax of right eye    Smoker    TIA (transient ischemic attack) 10/15/2021   HTN (hypertension) 10/15/2021   Chronic bronchitis (HCC) 10/15/2021   Alcohol-induced mood disorder (HCC) 08/28/2019   MDD (major depressive disorder), recurrent episode, moderate (HCC) 08/28/2019   Severe recurrent major depression without psychotic features (HCC) 06/08/2018   Alcohol use disorder, severe, dependence (HCC) 06/08/2018     Medications Ordered Prior to Encounter[1]  Allergies[2]  Social History   Socioeconomic History   Marital status: Divorced    Spouse name: Not on file   Number of children: Not on file   Years of education: Not on file   Highest education level: Not on file  Occupational History   Not on file  Tobacco Use   Smoking status: Every Day    Current packs/day: 1.50    Average packs/day: 1.5 packs/day for 52.0 years (77.9 ttl pk-yrs)    Types: Cigarettes     Start date: 08/24/1972    Passive exposure: Current   Smokeless tobacco: Never  Vaping Use   Vaping status: Never Used  Substance and Sexual Activity   Alcohol use: Not Currently    Comment: >1 pint vodka daily and wine   Drug use: Yes    Types: Marijuana   Sexual activity: Not on file  Other Topics Concern   Not on file  Social History Narrative   Not on file   Social Drivers of Health   Tobacco Use: High Risk (08/07/2024)   Patient History    Smoking Tobacco Use: Every Day    Smokeless Tobacco Use: Never    Passive Exposure: Current  Financial Resource Strain: Low Risk (03/13/2024)   Overall Financial Resource Strain (CARDIA)    Difficulty of Paying Living Expenses: Not hard at all  Food Insecurity: No Food Insecurity (02/29/2024)   Epic    Worried About Programme Researcher, Broadcasting/film/video in the Last Year: Never true    Ran Out of Food in the Last Year: Never true  Transportation Needs: No Transportation Needs (02/29/2024)   Epic    Lack of Transportation (Medical): No    Lack of Transportation (Non-Medical): No  Physical Activity: Inactive (03/13/2024)   Exercise Vital Sign    Days of Exercise per Week: 0 days    Minutes of Exercise per Session: 0 min  Stress: No Stress Concern Present (03/13/2024)   [employer] of Occupational [employer] - Occupational Stress Questionnaire    Feeling of Stress: Only a little  Social Connections: Socially Isolated (  02/23/2024)   Social Connection and Isolation Panel    Frequency of Communication with Friends and Family: Once a week    Frequency of Social Gatherings with Friends and Family: Never    Attends Religious Services: More than 4 times per year    Active Member of Clubs or Organizations: No    Attends Banker Meetings: Never    Marital Status: Never married  Intimate Partner Violence: Not At Risk (02/29/2024)   Epic    Fear of Current or Ex-Partner: No    Emotionally Abused: No    Physically Abused: No    Sexually Abused: No   Depression (PHQ2-9): Low Risk (08/07/2024)   Depression (PHQ2-9)    PHQ-2 Score: 1  Alcohol Screen: Medium Risk (02/29/2024)   Alcohol Screen    Last Alcohol Screening Score (AUDIT): 11  Housing: Low Risk (02/29/2024)   Epic    Unable to Pay for Housing in the Last Year: No    Number of Times Moved in the Last Year: 0    Homeless in the Last Year: No  Utilities: Not At Risk (02/29/2024)   Epic    Threatened with loss of utilities: No  Health Literacy: Adequate Health Literacy (02/23/2024)   B1300 Health Literacy    Frequency of need for help with medical instructions: Never    Family History  Problem Relation Age of Onset   Cirrhosis Father     Past Surgical History:  Procedure Laterality Date   BASAL CELL CARCINOMA EXCISION  2012   Right side nose    BREAST ENHANCEMENT SURGERY  2002   ELBOW SURGERY     Right elbow surgery---Ruptured tendon    ROS: Review of Systems Negative except as stated above  PHYSICAL EXAM: BP 115/79   Pulse 79   Temp 98.6 F (37 C) (Oral)   Resp 18   Ht 5' 6 (1.676 m)   Wt 136 lb (61.7 kg) Comment: per patient would not get on scale  SpO2 92%   BMI 21.95 kg/m   Physical Exam HENT:     Head: Normocephalic and atraumatic.     Nose: Nose normal.     Mouth/Throat:     Mouth: Mucous membranes are moist.     Pharynx: Oropharynx is clear.  Eyes:     Extraocular Movements: Extraocular movements intact.     Conjunctiva/sclera: Conjunctivae normal.     Pupils: Pupils are equal, round, and reactive to light.  Cardiovascular:     Rate and Rhythm: Normal rate and regular rhythm.     Pulses: Normal pulses.     Heart sounds: Normal heart sounds.  Pulmonary:     Effort: Pulmonary effort is normal.     Breath sounds: Normal breath sounds.  Musculoskeletal:        General: Normal range of motion.     Cervical back: Normal range of motion and neck supple.  Neurological:     General: No focal deficit present.     Mental Status: She is alert and  oriented to person, place, and time.  Psychiatric:        Mood and Affect: Mood normal.        Behavior: Behavior normal.     ASSESSMENT AND PLAN: 1. Primary hypertension (Primary) - Continue Amlodipine  and Valsartan  as prescribed.  - Counseled on blood pressure goal of less than 130/80, low-sodium, DASH diet, medication compliance, and 150 minutes of moderate intensity exercise per week as tolerated. Counseled on  medication adherence and adverse effects. - Follow-up with primary provider in 3 months or sooner if needed.  - amLODipine  (NORVASC ) 10 MG tablet; Take 1 tablet (10 mg total) by mouth daily.  Dispense: 90 tablet; Refill: 0 - valsartan  (DIOVAN ) 40 MG tablet; Take 1 tablet (40 mg total) by mouth daily.  Dispense: 90 tablet; Refill: 0  2. Hyperlipidemia, unspecified hyperlipidemia type - Continue Atorvastatin  as prescribed. Counseled on medication adherence/adverse effects.  - Follow-up with primary provider in 3 months or sooner if needed. - atorvastatin  (LIPITOR ) 80 MG tablet; Take 1 tablet (80 mg total) by mouth at bedtime.  Dispense: 90 tablet; Refill: 0  3. Cough, unspecified type - Patient today in office with no cardiopulmonary/acute distress.  - Continue Albuterol  inhaler as prescribed. Counseled on medication adherence/adverse effects.  - Follow-up with primary provider as scheduled.  - albuterol  (VENTOLIN  HFA) 108 (90 Base) MCG/ACT inhaler; Inhale 2 puffs into the lungs every 6 (six) hours as needed for wheezing or shortness of breath.  Dispense: 18 g; Refill: 2  4. Anxiety and depression - Patient denies thoughts of self-harm, suicidal ideations, homicidal ideations. - Patient states she is not taking anxiety depression medication per her preference.  - Keep all scheduled appointments with established Psychiatry.   5. Insomnia, unspecified type - Continue Trazodone  as prescribed. Counseled on medication adherence/adverse effects.  - Follow-up with primary provider  in 3 months or sooner if needed. - trazodone  (DESYREL ) 300 MG tablet; Take 1 tablet (300 mg total) by mouth at bedtime.  Dispense: 90 tablet; Refill: 0    Patient was given the opportunity to ask questions.  Patient verbalized understanding of the plan and was able to repeat key elements of the plan. Patient was given clear instructions to go to Emergency Department or return to medical center if symptoms don't improve, worsen, or new problems develop.The patient verbalized understanding.   Requested Prescriptions   Signed Prescriptions Disp Refills   albuterol  (VENTOLIN  HFA) 108 (90 Base) MCG/ACT inhaler 18 g 2    Sig: Inhale 2 puffs into the lungs every 6 (six) hours as needed for wheezing or shortness of breath.   amLODipine  (NORVASC ) 10 MG tablet 90 tablet 0    Sig: Take 1 tablet (10 mg total) by mouth daily.   atorvastatin  (LIPITOR ) 80 MG tablet 90 tablet 0    Sig: Take 1 tablet (80 mg total) by mouth at bedtime.   trazodone  (DESYREL ) 300 MG tablet 90 tablet 0    Sig: Take 1 tablet (300 mg total) by mouth at bedtime.   valsartan  (DIOVAN ) 40 MG tablet 90 tablet 0    Sig: Take 1 tablet (40 mg total) by mouth daily.    Return in about 3 months (around 11/05/2024) for Follow-Up or next available chronic conditions.  Greig JINNY Chute, NP      [1]  Current Outpatient Medications on File Prior to Visit  Medication Sig Dispense Refill   aspirin  EC 81 MG tablet Take 1 tablet (81 mg total) by mouth daily. Swallow whole. (Patient not taking: Reported on 03/10/2024) 30 tablet 12   FLUoxetine  (PROZAC ) 10 MG capsule Take 1 capsule (10 mg total) by mouth daily. (Patient not taking: Reported on 03/10/2024) 90 capsule 0   risperiDONE  (RISPERDAL ) 1 MG tablet Take 1 tablet (1 mg total) by mouth at bedtime. (Patient not taking: Reported on 03/10/2024) 30 tablet 3   No current facility-administered medications on file prior to visit.  [2] No Known Allergies

## 2024-08-07 NOTE — Progress Notes (Signed)
 Follow up and medication refill, patient scored a 6 on GAD-7 however patient stated she is not taking her medication

## 2024-08-11 ENCOUNTER — Telehealth: Payer: Self-pay

## 2024-08-11 DIAGNOSIS — R059 Cough, unspecified: Secondary | ICD-10-CM

## 2024-08-11 DIAGNOSIS — E785 Hyperlipidemia, unspecified: Secondary | ICD-10-CM

## 2024-08-11 DIAGNOSIS — G4700 Insomnia, unspecified: Secondary | ICD-10-CM

## 2024-08-11 DIAGNOSIS — I10 Essential (primary) hypertension: Secondary | ICD-10-CM

## 2024-08-11 MED ORDER — ALBUTEROL SULFATE HFA 108 (90 BASE) MCG/ACT IN AERS: 2.0000 | INHALATION_SPRAY | Freq: Four times a day (QID) | RESPIRATORY_TRACT | 2 refills | Status: AC | PRN

## 2024-08-11 MED ORDER — TRAZODONE HCL 300 MG PO TABS: 300.0000 mg | ORAL_TABLET | Freq: Every day | ORAL | 0 refills | Status: AC

## 2024-08-11 MED ORDER — VALSARTAN 40 MG PO TABS: 40.0000 mg | ORAL_TABLET | Freq: Every day | ORAL | 0 refills | Status: AC

## 2024-08-11 MED ORDER — AMLODIPINE BESYLATE 10 MG PO TABS: 10.0000 mg | ORAL_TABLET | Freq: Every day | ORAL | 0 refills | Status: AC

## 2024-08-11 MED ORDER — ATORVASTATIN CALCIUM 80 MG PO TABS: 80.0000 mg | ORAL_TABLET | Freq: Every day | ORAL | 0 refills | Status: AC

## 2024-08-11 NOTE — Telephone Encounter (Signed)
 Copied from CRM #[id number]. Topic: Clinical - Prescription Issue >> Aug 11, 2024  4:20 PM Wess RAMAN wrote: Reason for CRM: Patient stated she is having difficulty getting her medications at the pharmacy and would like them sent to [facility].  Medications: albuterol  (VENTOLIN  HFA) 108 (90 Base) MCG/ACT inhaler  amLODipine  (NORVASC ) 10 MG tablet  atorvastatin  (LIPITOR ) 80 MG tablet  trazodone  (DESYREL ) 300 MG tablet  valsartan  (DIOVAN ) 40 MG tablet  Callback #: [phone]  Pharmacy: [facility] - [place], [place] - [address] [place] [address] Phone: [phone] Fax: [phone] Hours: Not open 24 hours

## 2024-08-11 NOTE — Telephone Encounter (Signed)
 Medications kept printing off on the printer when attempting to send as normal to the pharmacy.   Called preferred pharmacy to have pharmacy fill rxs.  Spoke with Ozell, Pharmacist at [facility].   Called in for: Albuterol  Atorvastatin  Trazodone  Valsartan  Amlodipine
